# Patient Record
Sex: Female | Born: 1935 | Race: White | Hispanic: No | Smoking: Former smoker
Health system: Southern US, Community
[De-identification: ages and names within clinical notes are randomized; demographics above are authoritative.]

## PROBLEM LIST (undated history)

## (undated) DIAGNOSIS — N952 Postmenopausal atrophic vaginitis: Secondary | ICD-10-CM

## (undated) DIAGNOSIS — J449 Chronic obstructive pulmonary disease, unspecified: Secondary | ICD-10-CM

## (undated) DIAGNOSIS — M8580 Other specified disorders of bone density and structure, unspecified site: Secondary | ICD-10-CM

## (undated) DIAGNOSIS — R3129 Other microscopic hematuria: Secondary | ICD-10-CM

## (undated) DIAGNOSIS — I10 Essential (primary) hypertension: Secondary | ICD-10-CM

## (undated) HISTORY — PX: OTHER SURGICAL HISTORY: SHX169

## (undated) HISTORY — DX: Other specified disorders of bone density and structure, unspecified site: M85.80

## (undated) HISTORY — PX: TONSILLECTOMY: SUR1361

## (undated) HISTORY — PX: THYROIDECTOMY, PARTIAL: SHX18

## (undated) HISTORY — PX: CYSTOSCOPY: SUR368

## (undated) HISTORY — DX: Other microscopic hematuria: R31.29

## (undated) HISTORY — DX: Postmenopausal atrophic vaginitis: N95.2

## (undated) HISTORY — DX: Chronic obstructive pulmonary disease, unspecified: J44.9

---
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
## 1997-12-19 ENCOUNTER — Other Ambulatory Visit: Admission: RE | Admit: 1997-12-19 | Discharge: 1997-12-19 | Payer: Self-pay | Admitting: Obstetrics and Gynecology

## 1998-01-15 ENCOUNTER — Emergency Department (HOSPITAL_COMMUNITY): Admission: EM | Admit: 1998-01-15 | Discharge: 1998-01-15 | Payer: Self-pay | Admitting: Emergency Medicine

## 1999-02-08 ENCOUNTER — Other Ambulatory Visit: Admission: RE | Admit: 1999-02-08 | Discharge: 1999-02-08 | Payer: Self-pay | Admitting: Obstetrics and Gynecology

## 2000-02-09 ENCOUNTER — Other Ambulatory Visit: Admission: RE | Admit: 2000-02-09 | Discharge: 2000-02-09 | Payer: Self-pay | Admitting: Obstetrics and Gynecology

## 2001-02-12 ENCOUNTER — Other Ambulatory Visit: Admission: RE | Admit: 2001-02-12 | Discharge: 2001-02-12 | Payer: Self-pay | Admitting: Obstetrics and Gynecology

## 2002-02-18 ENCOUNTER — Other Ambulatory Visit: Admission: RE | Admit: 2002-02-18 | Discharge: 2002-02-18 | Payer: Self-pay | Admitting: Obstetrics and Gynecology

## 2004-06-30 ENCOUNTER — Other Ambulatory Visit: Admission: RE | Admit: 2004-06-30 | Discharge: 2004-06-30 | Payer: Self-pay | Admitting: Obstetrics and Gynecology

## 2006-05-15 ENCOUNTER — Encounter: Admission: RE | Admit: 2006-05-15 | Discharge: 2006-05-15 | Payer: Self-pay | Admitting: Family Medicine

## 2006-08-29 ENCOUNTER — Other Ambulatory Visit: Admission: RE | Admit: 2006-08-29 | Discharge: 2006-08-29 | Payer: Self-pay | Admitting: Obstetrics and Gynecology

## 2007-01-18 ENCOUNTER — Ambulatory Visit (HOSPITAL_BASED_OUTPATIENT_CLINIC_OR_DEPARTMENT_OTHER): Admission: RE | Admit: 2007-01-18 | Discharge: 2007-01-18 | Payer: Self-pay | Admitting: Urology

## 2007-11-13 ENCOUNTER — Encounter: Admission: RE | Admit: 2007-11-13 | Discharge: 2007-11-13 | Payer: Self-pay | Admitting: Family Medicine

## 2008-09-15 ENCOUNTER — Ambulatory Visit: Payer: Self-pay | Admitting: Obstetrics and Gynecology

## 2008-09-15 ENCOUNTER — Encounter: Payer: Self-pay | Admitting: Obstetrics and Gynecology

## 2008-09-15 ENCOUNTER — Other Ambulatory Visit: Admission: RE | Admit: 2008-09-15 | Discharge: 2008-09-15 | Payer: Self-pay | Admitting: Obstetrics and Gynecology

## 2008-09-29 ENCOUNTER — Ambulatory Visit: Payer: Self-pay | Admitting: Obstetrics and Gynecology

## 2009-10-12 ENCOUNTER — Ambulatory Visit: Payer: Self-pay | Admitting: Obstetrics and Gynecology

## 2010-08-03 NOTE — Op Note (Signed)
Sierra Warner, Sierra Warner                   ACCOUNT NO.:  [id number]   MEDICAL RECORD NO.:  [id number]          PATIENT TYPE:  AMB   LOCATION:  [facility]                         FACILITY:  [facility]   PHYSICIAN:  Sigmund I. Patsi Sears, M.D.DATE OF BIRTH:  [birth year]   DATE OF PROCEDURE:  01/18/2007  DATE OF DISCHARGE:                               OPERATIVE REPORT   PREOPERATIVE DIAGNOSIS:  Severe atrophic urethritis, urethral stenosis,  microhematuria.   POSTOPERATIVE DIAGNOSIS:  Severe atrophic urethritis, urethral stenosis,  microhematuria.   OPERATION:  Urethral dilation and cystourethroscopy.   SURGEON:  Sigmund I. Patsi Sears, M.D.   ANESTHESIA:  General LMA.   PREPARATION:  After appropriate pre-anesthesia, the patient was brought  to the operating room and placed on the operating table in the dorsal  supine position where general LMA anesthesia was introduced.  She was  then replaced in the dorsal lithotomy position where the pubis was  prepped with Betadine solution and draped in the usual fashion.   REVIEW OF HISTORY:  This 77 year old female has a history of  microhematuria, and on examination in the office, was found to have  severe atrophic urethritis.  No caruncle was identified.  There was no  incontinence with cough or sneeze.  The patient was then returned to the  office for attempted cystoscopy, but because of severe urethral  stenosis, cystoscopy could not be accomplished in the office.  The  patient is now for cystoscopy and dilatation.   PROCEDURE:  Urethral dilation was accomplished to a size 49 [place] with  female sounds.  Xylocaine jelly was placed in the urethra and a  combination of Xylocaine and Marcaine with 1:200,000 epinephrine is  injected into the urethral tissue to afford postoperative urethral  analgesia.  Following urethral dilation, cystoscopy was performed which  shows the bladder with some trabeculation, but no cellules.  There was  no stone, tumor, or  diverticula formation noted.  Clear efflux was seen  from both orifices.  The cystoscope was removed and the remaining  Xylocaine jelly placed in the urethra and the bladder.  The patient was  given IV Toradol, awakened and taken to the recovery room in good  condition.      Sigmund I. Patsi Sears, M.D.  Electronically Signed     SIT/MEDQ  D:  01/18/2007  T:  01/18/2007  Job:  [id number]   cc:   Rande Brunt. Eda Paschal, M.D.  Fax: [phone]

## 2010-09-24 ENCOUNTER — Encounter: Payer: Self-pay | Admitting: *Deleted

## 2010-10-06 ENCOUNTER — Encounter (INDEPENDENT_AMBULATORY_CARE_PROVIDER_SITE_OTHER): Payer: Medicare Other

## 2010-10-06 DIAGNOSIS — M899 Disorder of bone, unspecified: Secondary | ICD-10-CM

## 2010-10-19 ENCOUNTER — Encounter: Payer: Self-pay | Admitting: Obstetrics and Gynecology

## 2010-10-19 ENCOUNTER — Other Ambulatory Visit (HOSPITAL_COMMUNITY)
Admission: RE | Admit: 2010-10-19 | Discharge: 2010-10-19 | Disposition: A | Discharge status: Home / Self Care | Payer: Medicare Other | Source: Ambulatory Visit | Attending: Obstetrics and Gynecology | Admitting: Obstetrics and Gynecology

## 2010-10-19 ENCOUNTER — Ambulatory Visit (INDEPENDENT_AMBULATORY_CARE_PROVIDER_SITE_OTHER): Payer: Medicare Other | Admitting: Obstetrics and Gynecology

## 2010-10-19 VITALS — BP 134/70 | Ht 61.0 in | Wt 124.0 lb

## 2010-10-19 DIAGNOSIS — M810 Age-related osteoporosis without current pathological fracture: Secondary | ICD-10-CM

## 2010-10-19 DIAGNOSIS — Z124 Encounter for screening for malignant neoplasm of cervix: Secondary | ICD-10-CM | POA: Insufficient documentation

## 2010-10-19 DIAGNOSIS — N951 Menopausal and female climacteric states: Secondary | ICD-10-CM

## 2010-10-19 DIAGNOSIS — N952 Postmenopausal atrophic vaginitis: Secondary | ICD-10-CM

## 2010-10-19 DIAGNOSIS — Z01419 Encounter for gynecological examination (general) (routine) without abnormal findings: Secondary | ICD-10-CM

## 2010-10-19 DIAGNOSIS — R319 Hematuria, unspecified: Secondary | ICD-10-CM

## 2010-10-19 DIAGNOSIS — R351 Nocturia: Secondary | ICD-10-CM

## 2010-10-19 DIAGNOSIS — Z780 Asymptomatic menopausal state: Secondary | ICD-10-CM

## 2010-10-19 NOTE — Progress Notes (Signed)
Addended byCammie Mcgee T on: 10/19/2010 02:58 PM   Modules accepted: Orders

## 2010-10-19 NOTE — Progress Notes (Signed)
The patient came to see me a day for continued gynecological care.Patient has been on Fosamax for osteoporosis.Her bone density now shows significant improvement. She only has osteopenia. We have given her a drug holiday.she continues to take calcium and Vitamin D. She is having no pelvic pain or vaginal bleeding. She continues to have microscopic hematuria and is seeing Dr. Gayla Medicus. She is up-to-date on mammograms. She has significant vaginal dryness but is not sexually active and does not need treatment for this. She is nocturia x2 without urgency. She has no dysuria or urinary tract infections. She has minimal menopausal symptoms.  Past medical history unchanged. Please see above notes. Family history unchanged. Please see above notes. Review of systems completely reviewed and is negative except for increasing aches and pains in her neck and right shoulder.  Physical examination: HEENT within normal limits. Neck: Thyroid not large. No masses. Supraclavicular nodes: not enlarged. Breasts: Examined in both sitting midline position. No skin changes and no masses. Abdomen: Soft no guarding rebound or masses or hernia. Pelvic: External: Within normal limits. BUS: Within normal limits. Vaginal:within normal limits. Poor estrogen effect. No evidence of cystocele rectocele or enterocele. Cervix: clean. Uterus: Normal size and shape. Adnexa: No masses. Rectovaginal exam: Confirmatory and negative. Extremities: Within normal limits.  Impression: 1. Low bone mass with improvement. 2. Atrophic vaginitis. 3. Nocturia. 4. Menopausal symptoms. 5. Microscopic hematuria.  Plan: Continue drug holiday, continue yearly mammograms, recheck her urine today, no treatment for nocturia.

## 2010-11-04 ENCOUNTER — Encounter: Payer: Self-pay | Admitting: Obstetrics and Gynecology

## 2010-12-28 ENCOUNTER — Other Ambulatory Visit: Payer: Self-pay | Admitting: Dermatology

## 2010-12-29 LAB — I-STAT 8, (EC8 V) (CONVERTED LAB)
BUN: 12
Bicarbonate: 26 — ABNORMAL HIGH
Chloride: 104
HCT: 49 — ABNORMAL HIGH
Operator id: 268271
Sodium: 136
pH, Ven: 7.39 — ABNORMAL HIGH

## 2011-01-17 ENCOUNTER — Other Ambulatory Visit: Payer: Self-pay | Admitting: Dermatology

## 2011-03-31 ENCOUNTER — Other Ambulatory Visit: Payer: Self-pay | Admitting: Family Medicine

## 2011-03-31 ENCOUNTER — Ambulatory Visit
Admission: RE | Admit: 2011-03-31 | Discharge: 2011-03-31 | Disposition: A | Discharge status: Home / Self Care | Payer: Medicare Other | Source: Ambulatory Visit | Attending: Family Medicine | Admitting: Family Medicine

## 2011-03-31 DIAGNOSIS — R05 Cough: Secondary | ICD-10-CM

## 2011-03-31 DIAGNOSIS — R0602 Shortness of breath: Secondary | ICD-10-CM

## 2011-03-31 DIAGNOSIS — J441 Chronic obstructive pulmonary disease with (acute) exacerbation: Secondary | ICD-10-CM | POA: Diagnosis not present

## 2011-03-31 DIAGNOSIS — J069 Acute upper respiratory infection, unspecified: Secondary | ICD-10-CM | POA: Diagnosis not present

## 2011-03-31 DIAGNOSIS — R0989 Other specified symptoms and signs involving the circulatory and respiratory systems: Secondary | ICD-10-CM | POA: Diagnosis not present

## 2011-04-04 ENCOUNTER — Ambulatory Visit (INDEPENDENT_AMBULATORY_CARE_PROVIDER_SITE_OTHER): Payer: Medicare Other | Admitting: Internal Medicine

## 2011-04-04 ENCOUNTER — Encounter: Payer: Self-pay | Admitting: Internal Medicine

## 2011-04-04 VITALS — BP 142/78 | HR 77 | Temp 97.9°F | Ht 61.0 in | Wt 122.4 lb

## 2011-04-04 DIAGNOSIS — I10 Essential (primary) hypertension: Secondary | ICD-10-CM

## 2011-04-04 DIAGNOSIS — R0609 Other forms of dyspnea: Secondary | ICD-10-CM | POA: Diagnosis not present

## 2011-04-04 DIAGNOSIS — R0989 Other specified symptoms and signs involving the circulatory and respiratory systems: Secondary | ICD-10-CM

## 2011-04-04 DIAGNOSIS — R0600 Dyspnea, unspecified: Secondary | ICD-10-CM

## 2011-04-04 DIAGNOSIS — J449 Chronic obstructive pulmonary disease, unspecified: Secondary | ICD-10-CM | POA: Diagnosis not present

## 2011-04-04 MED ORDER — OLMESARTAN MEDOXOMIL-HCTZ 20-12.5 MG PO TABS: 1.0000 | ORAL_TABLET | Freq: Every day | ORAL | Status: DC

## 2011-04-04 NOTE — Patient Instructions (Signed)
Stop lisinopril  Start Benicar 20/12.5  One daily in place of lisinopril - it's the only way we can tell to what extent your present problems are being caused by your medication  Please schedule a follow up office visit in 4 weeks, sooner if needed with pft's on return

## 2011-04-04 NOTE — Assessment & Plan Note (Signed)
ACE inhibitors are problematic in  pts with airway complaints because  even experienced pulmonologists can't always distinguish ace effects from copd/asthma.  By themselves they don't actually cause a problem, much like oxygen can't by itself start a fire, but they certainly serve as a powerful catalyst or enhancer for any "fire"  or inflammatory process in the upper airway, be it caused by an ET  tube or more commonly reflux (especially in the obese or pts with known GERD or who are on biphoshonates).    In the era of ARB near equivalency until we have a better handle on the reversibility of the airway problem, it just makes sense to avoid ACEI  entirely in the short run and then decide later, having established a level of airway control using a reasonable limited regimen, whether to add back ace but even then being very careful to observe the pt for worsening airway control and number of meds used/ needed to control symptoms.    Will rec she change lisinopril to benicar 20/12.5 one daily x one month trial

## 2011-04-04 NOTE — Assessment & Plan Note (Signed)
  When respiratory symptoms begin well after a patient reports complete smoking cessation,  it is very hard to "blame" COPD specifically or airways disorders in general  ie it doesn't make any more sense than hearing a  [employer] driver wrecked his car while driving his kids to school or a surgeon sliced his hand off carving roast beef (it must be rare indeed!)     That is to say, once the high risk activity stops,  the symptoms should not suddenly erupt or markedly worsen.  If so, the differential diagnosis should include  obesity/deconditioning,  LPR/Reflux/Aspiration syndromes,  occult CHF, or  especially side effect of medications commonly used in this population, especially ace inhibitors   Needs fair trial off acei and then return to regroup with pfts

## 2011-04-04 NOTE — Progress Notes (Signed)
  Subjective:    Patient ID: Sierra Warner, female    DOB: 1928-12-01    MRN: [id number]  HPI  49 yowf quit smoking around 2007 due to concerns re risk of lung dz but actually no active symptoms or need for any inhalers at all  until 2010 when sob and cough onset so  Started symbicort and combivent and referred by Dr Jillyn Hidden 04/04/2011 to pulmonary clinic for refractory cough and sob.   04/04/2011  1 st pulmonary eval cc before Christmas  2012 breathing ok on symbicort / combivent happy x cost and "mouth and throat irriation"  then much worse abruptly since Christmas 2012 main complaint is throat and chest congestion but not excess mucus, no better with combivent, no purulent sputum assoc with nasal congestion.  Sleeping ok without nocturnal  or early am exacerbation  of respiratory  c/o's or need for noct saba. Also denies any obvious fluctuation of symptoms with weather or environmental changes or other aggravating or alleviating factors except as outlined above      Review of Systems  Constitutional: Positive for unexpected weight change. Negative for fever and chills.  HENT: Positive for congestion and sneezing. Negative for ear pain, nosebleeds, sore throat, rhinorrhea, trouble swallowing, dental problem, voice change, postnasal drip and sinus pressure.   Eyes: Negative for visual disturbance.  Respiratory: Positive for cough and shortness of breath. Negative for choking.   Cardiovascular: Negative for chest pain and leg swelling.  Gastrointestinal: Positive for abdominal pain. Negative for vomiting and diarrhea.  Genitourinary: Negative for difficulty urinating.  Musculoskeletal: Negative for arthralgias.  Skin: Negative for rash.  Neurological: Negative for tremors, syncope and headaches.  Hematological: Does not bruise/bleed easily.       Objective:   Physical Exam  amb hoarse wf with hopeless helpless affect nad with classic voice fatigue, throat clearing and pseudowheze Wt 122  04/04/2011  HEENT mild turbinate edema.  Oropharynx no thrush or excess pnd or cobblestoning.  No JVD or cervical adenopathy. Mild accessory muscle hypertrophy. Trachea midline, nl thryroid. Chest was hyperinflated by percussion with diminished breath sounds and mild increased exp time without wheeze. Hoover sign positive at  End inspiration. Regular rate and rhythm without murmur gallop or rub or increase P2 or edema.  Abd: no hsm, nl excursion. Ext warm without cyanosis or clubbing.    03/31/11 cxr No change in hyperaeration. No active lung disease.     Assessment & Plan:

## 2011-04-18 DIAGNOSIS — L723 Sebaceous cyst: Secondary | ICD-10-CM | POA: Diagnosis not present

## 2011-05-02 ENCOUNTER — Ambulatory Visit (INDEPENDENT_AMBULATORY_CARE_PROVIDER_SITE_OTHER): Payer: Medicare Other | Admitting: Internal Medicine

## 2011-05-02 ENCOUNTER — Encounter: Payer: Self-pay | Admitting: Internal Medicine

## 2011-05-02 VITALS — BP 144/70 | HR 113 | Temp 98.1°F | Ht 61.0 in | Wt 122.0 lb

## 2011-05-02 DIAGNOSIS — R0600 Dyspnea, unspecified: Secondary | ICD-10-CM

## 2011-05-02 DIAGNOSIS — I10 Essential (primary) hypertension: Secondary | ICD-10-CM | POA: Diagnosis not present

## 2011-05-02 DIAGNOSIS — R0609 Other forms of dyspnea: Secondary | ICD-10-CM | POA: Diagnosis not present

## 2011-05-02 DIAGNOSIS — J449 Chronic obstructive pulmonary disease, unspecified: Secondary | ICD-10-CM | POA: Diagnosis not present

## 2011-05-02 DIAGNOSIS — R0989 Other specified symptoms and signs involving the circulatory and respiratory systems: Secondary | ICD-10-CM | POA: Diagnosis not present

## 2011-05-02 LAB — PULMONARY FUNCTION TEST

## 2011-05-02 MED ORDER — BUDESONIDE 0.25 MG/2ML IN SUSP: 0.2500 mg | Freq: Two times a day (BID) | RESPIRATORY_TRACT | Status: DC

## 2011-05-02 MED ORDER — FORMOTEROL FUMARATE 20 MCG/2ML IN NEBU: 20.0000 ug | INHALATION_SOLUTION | Freq: Two times a day (BID) | RESPIRATORY_TRACT | Status: DC

## 2011-05-02 NOTE — Patient Instructions (Signed)
Performist 20 mcg and Budesonide 0.25 mg  twice daily with nebulizer  Stay on Benicar 20 mg/ 12.5 mg daily until seen by Fulp  Please see patient coordinator before you leave today  to schedule a home delivery nebulizer   Only use your combient  as a rescue medication to be used if you can't catch your breath by resting or doing a relaxed purse lip breathing pattern. The less you use it, the better it will work when you need it.    If you are satisfied with your treatment plan let your doctor know and he/she can either refill your medications or you can return here when your prescription runs out.     If in any way you are not 100% satisfied,  please tell us.  If 100% better, tell your friends!

## 2011-05-02 NOTE — Progress Notes (Signed)
PFT done today. 

## 2011-05-02 NOTE — Assessment & Plan Note (Signed)
-   Trial off ACEI rec 04/04/2011  > marked symptomatic improvement    - PFT's 05/02/2011 FEV1  0.69 (43%) ratio 43 and 18% better p B2, DLCO 57% corrects to 103  GOLD II/III copd but significant reversibility suggesting an ab component so best choice here is combination rx with LABA and ICS  I had an extended discussion with the patient today lasting 15 to 20 minutes of a 25 minute visit on the following issues:   Multiple options reviewed, thinks neb will work the best    Each maintenance medication was reviewed in detail including most importantly the difference between maintenance and as needed and under what circumstances the prns are to be used.  Please see instructions for details which were reviewed in writing and the patient given a copy.

## 2011-05-02 NOTE — Progress Notes (Signed)
  Subjective:    Patient ID: Sierra Warner, female    DOB: [birth year]    MRN: [id number]  Brief patient profile:  82 yowf quit smoking around 2007 due to concerns re risk of lung dz but actually no active symptoms or need for any inhalers at all  until 2010 when sob and cough onset so  Started symbicort and combivent and referred by Dr Sierra Warner 04/04/2011 to pulmonary clinic for refractory cough and sob.   04/04/2011  1 st pulmonary eval cc before Christmas  2012 breathing ok on symbicort / combivent happy x cost and "mouth and throat irriation"  then much worse abruptly since Christmas 2012 main complaint is throat and chest congestion but not excess mucus, no better with combivent, no purulent sputum assoc with nasal congestion. rec Stop lisinopril Start Benicar 20/12.5  One daily in place of lisinopril - it's the only way we can tell to what extent your present problems are being caused by your medication    05/02/2011 f/u ov/Sierra Warner cc cough and throat irritation better, on symbicort but doesn't think she can afford it and off acei since 04/04/11,  No excess sputum production, confused with maint symbicort vs prn symbicort  Sleeping ok without nocturnal  or early am exacerbation  of respiratory  c/o's or need for noct saba. Also denies any obvious fluctuation of symptoms with weather or environmental changes or other aggravating or alleviating factors except as outlined above   ROS  At present neg for  any significant sore throat, dysphagia, itching, sneezing,  nasal congestion or excess/ purulent secretions,  fever, chills, sweats, unintended wt loss, pleuritic or exertional cp, hempoptysis, orthopnea pnd or leg swelling.  Also denies presyncope, palpitations, heartburn, abdominal pain, nausea, vomiting, diarrhea  or change in bowel or urinary habits, dysuria,hematuria,  rash, arthralgias, visual complaints, headache, numbness weakness or ataxia.        Objective:   Physical Exam  amb hoarse wf with  much less, throat clearing and no longer pseudowheze Wt 122 04/04/2011 > 05/02/2011  122 HEENT mild turbinate edema.  Oropharynx no thrush or excess pnd or cobblestoning.  No JVD or cervical adenopathy. Mild accessory muscle hypertrophy. Trachea midline, nl thryroid. Chest was hyperinflated by percussion with diminished breath sounds and mild increased exp time without wheeze. Hoover sign positive at  End inspiration. Regular rate and rhythm without murmur gallop or rub or increase P2 or edema.  Abd: no hsm, nl excursion. Ext warm without cyanosis or clubbing.    03/31/11 cxr No change in hyperaeration. No active lung disease.     Assessment & Plan:

## 2011-05-02 NOTE — Assessment & Plan Note (Signed)
ACE inhibitors are problematic in  pts with airway complaints because  even experienced pulmonologists can't always distinguish ace effects from copd/asthma/pnds/ allergies etc.  By themselves they don't actually cause a problem, much like oxygen can't by itself start a fire, but they certainly serve as a powerful catalyst or enhancer for any "fire"  or inflammatory process in the upper airway, be it caused by an ET  tube or more commonly reflux (especially in the obese or pts with known GERD or who are on biphoshonates) or URI's, due to interference with bradykinin clearance.  The effects of acei on bradykinin levels occurs in 100% of pt's on acei (unless they surreptitiously stop the med!) but the classic cough is only reported in 5%.  This leaves 95% of pts on acei's  with a variety of syndromes including no identifiable symptom in most  vs non-specific symptoms that wax and wane depending on what other insult is occuring at the level of the upper airway. The only way to be sure about the latter is a trial off acei x min 4 weeks which was done here and she is clearly better so I would avoid ACEI in this pt with tendency to non-specific/ chronic recurrent resp symptoms in favor of arb which she is tolerating well.    Defer final choice of rx to primary svc

## 2011-05-19 ENCOUNTER — Encounter: Payer: Self-pay | Admitting: Internal Medicine

## 2011-05-19 DIAGNOSIS — J449 Chronic obstructive pulmonary disease, unspecified: Secondary | ICD-10-CM | POA: Diagnosis not present

## 2011-05-20 DIAGNOSIS — L02419 Cutaneous abscess of limb, unspecified: Secondary | ICD-10-CM | POA: Diagnosis not present

## 2011-05-20 DIAGNOSIS — J4489 Other specified chronic obstructive pulmonary disease: Secondary | ICD-10-CM | POA: Diagnosis not present

## 2011-05-20 DIAGNOSIS — R238 Other skin changes: Secondary | ICD-10-CM | POA: Diagnosis not present

## 2011-05-20 DIAGNOSIS — I10 Essential (primary) hypertension: Secondary | ICD-10-CM | POA: Diagnosis not present

## 2011-05-20 DIAGNOSIS — J449 Chronic obstructive pulmonary disease, unspecified: Secondary | ICD-10-CM | POA: Diagnosis not present

## 2011-05-20 DIAGNOSIS — L03119 Cellulitis of unspecified part of limb: Secondary | ICD-10-CM | POA: Diagnosis not present

## 2011-05-20 DIAGNOSIS — Z79899 Other long term (current) drug therapy: Secondary | ICD-10-CM | POA: Diagnosis not present

## 2011-05-30 ENCOUNTER — Encounter: Payer: Self-pay | Admitting: Internal Medicine

## 2011-06-01 ENCOUNTER — Other Ambulatory Visit: Payer: Self-pay | Admitting: Internal Medicine

## 2011-06-01 ENCOUNTER — Telehealth: Payer: Self-pay | Admitting: Internal Medicine

## 2011-06-01 DIAGNOSIS — R0902 Hypoxemia: Secondary | ICD-10-CM

## 2011-06-01 DIAGNOSIS — G4734 Idiopathic sleep related nonobstructive alveolar hypoventilation: Secondary | ICD-10-CM

## 2011-06-01 NOTE — Telephone Encounter (Signed)
Per MW- ONO on RA does qualify pt for o2 at 2 lpm with sleep Needs to start on o2 and then have ONO repeated on 2 lpm with an ov to follow  Spoke with pt and notified of this and she verbalized understanding. Order sent to [facility] for ONO on 2lpm and we will call the pt with results and then set up ov at that time.

## 2011-06-06 ENCOUNTER — Encounter: Payer: Self-pay | Admitting: Internal Medicine

## 2011-06-06 DIAGNOSIS — J9620 Acute and chronic respiratory failure, unspecified whether with hypoxia or hypercapnia: Secondary | ICD-10-CM | POA: Insufficient documentation

## 2011-06-13 ENCOUNTER — Ambulatory Visit (INDEPENDENT_AMBULATORY_CARE_PROVIDER_SITE_OTHER): Payer: Medicare Other | Admitting: Internal Medicine

## 2011-06-13 ENCOUNTER — Encounter: Payer: Self-pay | Admitting: Internal Medicine

## 2011-06-13 VITALS — BP 138/78 | HR 105 | Temp 97.8°F | Ht 60.25 in | Wt 124.2 lb

## 2011-06-13 DIAGNOSIS — J449 Chronic obstructive pulmonary disease, unspecified: Secondary | ICD-10-CM

## 2011-06-13 DIAGNOSIS — J9610 Chronic respiratory failure, unspecified whether with hypoxia or hypercapnia: Secondary | ICD-10-CM | POA: Diagnosis not present

## 2011-06-13 NOTE — Progress Notes (Signed)
  Subjective:    Patient ID: Sierra Warner, female    DOB: [birth year]    MRN: [id number]  Brief patient profile:  82 yowf quit smoking around 2007 due to concerns re risk of lung dz but actually no active symptoms or need for any inhalers at all  until 2010 when sob and cough onset so  Started symbicort and combivent and referred by Dr Jillyn Hidden 04/04/2011 to pulmonary clinic for refractory cough and sob on acei with GOLD III copd by pfts 04/2011.   04/04/2011  1 st pulmonary eval cc before Christmas  2012 breathing ok on symbicort / combivent happy x cost and "mouth and throat irriation"  then much worse abruptly since Christmas 2012 main complaint is throat and chest congestion but not excess mucus, no better with combivent, no purulent sputum assoc with nasal congestion. rec Stop lisinopril Start Benicar 20/12.5  One daily in place of lisinopril - it's the only way we can tell to what extent your present problems are being caused by your medication.    05/02/2011 f/u ov/Elianah Karis cc cough and throat irritation better, on symbicort but doesn't think she can afford it and off acei since 04/04/11,  No excess sputum production, confused with maint symbicort vs prn symbicort rec Performist 20 mcg and Budesonide 0.25 mg  twice daily with nebulizer Stay on Benicar 20 mg/ 12.5 mg daily until seen by Fulp Please see patient coordinator before you leave today  to schedule a home delivery nebulizer  Only use your combient  as a rescue medication  06/13/2011 f/u ov/Luc Shammas cc doe but not able to name one daily desired activity where breathing limits her, no sign cough or extra doses of combivent as rescue daytime - concerned mostly about how long it takes to use the nebulizer vs HFA which she is actually quite good at but cannot afford under medicare part D.  Sleeping ok without nocturnal  or early am exacerbation  of respiratory  c/o's or need for noct saba. Also denies any obvious fluctuation of symptoms with weather or  environmental changes or other aggravating or alleviating factors except as outlined above.  ROS  At present neg for  any significant sore throat, dysphagia, itching, sneezing,  nasal congestion or excess/ purulent secretions,  fever, chills, sweats, unintended wt loss, pleuritic or exertional cp, hempoptysis, orthopnea pnd or leg swelling.  Also denies presyncope, palpitations, heartburn, abdominal pain, nausea, vomiting, diarrhea  or change in bowel or urinary habits, dysuria,hematuria,  rash, arthralgias, visual complaints, headache, numbness weakness or ataxia.        Objective:   Physical Exam  amb hoarse wf with much less, throat clearing and no longer pseudowheze Wt 122 04/04/2011 > 05/02/2011  122 > 06/13/2011 124  HEENT mild turbinate edema.  Oropharynx no thrush or excess pnd or cobblestoning.  No JVD or cervical adenopathy. Mild accessory muscle hypertrophy. Trachea midline, nl thryroid. Chest was hyperinflated by percussion with diminished breath sounds and mild increased exp time without wheeze. Hoover sign positive at  End inspiration. Regular rate and rhythm without murmur gallop or rub or increase P2 or edema.  Abd: no hsm, nl excursion. Ext warm without cyanosis or clubbing.    03/31/11 cxr No change in hyperaeration. No active lung disease.     Assessment & Plan:

## 2011-06-13 NOTE — Patient Instructions (Signed)
Continue the nebulizer first thing each am but the second dose is optional  Please see patient coordinator before you leave today  to schedule 02 at 2lpm then recheck your saturations on 2lpms  Zyrtec is the best allergy pill on the marked   Please schedule a follow up visit in 3 months but call sooner if needed

## 2011-06-14 NOTE — Assessment & Plan Note (Signed)
-   Trial off ACEI rec 04/04/2011  > marked symptomatic improvement    - PFT's 05/02/2011 FEV1  0.69 (43%) ratio 43 and 18% better p B2, DLCO 57% corrects to 103  GOLD III with significant reversible component and no desired activities she's limited from doing p daily bud/formoterol nor any tendency to aecopd at this point off acei, so for now keep it simple with either symbicort 160 or it's equivalient per neb.    Each maintenance medication was reviewed in detail including most importantly the difference between maintenance and as needed and under what circumstances the prns are to be used.  Please see instructions for details which were reviewed in writing and the patient given a copy.

## 2011-06-14 NOTE — Assessment & Plan Note (Signed)
-   ONO RA 05/18/11 desat 18 min < 88%  rx 02 2lpm 06/13/2011 and then recheck ono on 2lpm

## 2011-06-15 ENCOUNTER — Telehealth: Payer: Self-pay | Admitting: Internal Medicine

## 2011-06-15 NOTE — Telephone Encounter (Signed)
Noted.  Will forward to MW as an Financial planner.

## 2011-06-20 ENCOUNTER — Encounter: Payer: Self-pay | Admitting: Internal Medicine

## 2011-06-27 ENCOUNTER — Telehealth: Payer: Self-pay | Admitting: Internal Medicine

## 2011-06-27 NOTE — Telephone Encounter (Signed)
Per MW- ONO on 2 lpm is okay for now, should cont. o2 at 2 lpm with sleep and will discuss further at next ov.  Spoke with pt and notified of results and she verbalized understanding.

## 2011-07-05 ENCOUNTER — Encounter: Payer: Self-pay | Admitting: Internal Medicine

## 2011-08-23 DIAGNOSIS — E785 Hyperlipidemia, unspecified: Secondary | ICD-10-CM | POA: Diagnosis not present

## 2011-08-23 DIAGNOSIS — Z79899 Other long term (current) drug therapy: Secondary | ICD-10-CM | POA: Diagnosis not present

## 2011-08-25 DIAGNOSIS — F411 Generalized anxiety disorder: Secondary | ICD-10-CM | POA: Diagnosis not present

## 2011-08-25 DIAGNOSIS — I10 Essential (primary) hypertension: Secondary | ICD-10-CM | POA: Diagnosis not present

## 2011-08-25 DIAGNOSIS — J4489 Other specified chronic obstructive pulmonary disease: Secondary | ICD-10-CM | POA: Diagnosis not present

## 2011-08-25 DIAGNOSIS — J449 Chronic obstructive pulmonary disease, unspecified: Secondary | ICD-10-CM | POA: Diagnosis not present

## 2011-08-25 DIAGNOSIS — E785 Hyperlipidemia, unspecified: Secondary | ICD-10-CM | POA: Diagnosis not present

## 2011-09-12 ENCOUNTER — Encounter: Payer: Self-pay | Admitting: Internal Medicine

## 2011-09-12 ENCOUNTER — Ambulatory Visit (INDEPENDENT_AMBULATORY_CARE_PROVIDER_SITE_OTHER): Payer: Medicare Other | Admitting: Internal Medicine

## 2011-09-12 VITALS — BP 158/88 | HR 111 | Temp 98.1°F | Ht 62.0 in | Wt 125.6 lb

## 2011-09-12 DIAGNOSIS — J449 Chronic obstructive pulmonary disease, unspecified: Secondary | ICD-10-CM | POA: Diagnosis not present

## 2011-09-12 DIAGNOSIS — J9610 Chronic respiratory failure, unspecified whether with hypoxia or hypercapnia: Secondary | ICD-10-CM | POA: Diagnosis not present

## 2011-09-12 NOTE — Assessment & Plan Note (Signed)
-   Trial off ACEI rec 04/04/2011  > marked symptomatic improvement    - PFT's 05/02/2011 FEV1  0.69 (43%) ratio 43 and 18% better p B2, DLCO 57% corrects to 103  GOLD II/III with no tendency to aecopd but noct hypoxemia > refuses to wear 02 qhs but wants to keep it in case  No change in meds needed -each maintenance medication was reviewed in detail including most importantly the difference between maintenance and as needed and under what circumstances the prns are to be used.  Please see instructions for details which were reviewed in writing and the patient given a copy.

## 2011-09-12 NOTE — Assessment & Plan Note (Signed)
-   ONO RA 05/18/11 desat 18 min < 88%  rx 02 2lpm 06/13/2011 and then rechecked ono on 2lpm 06/14/11  > 17 min desat all above 80    - Declined to wear 02 qhs 09/12/2011   Discussed in detail all the  indications, usual  risks and alternatives  relative to the benefits with patient who declined qhs 02 but wants to keep the concentrator for prn use nonetheless.

## 2011-09-12 NOTE — Progress Notes (Signed)
Subjective:    Patient ID: Sierra Warner, female    DOB: March 21, 1929    MRN: [id number]  Brief patient profile:  83 yowf quit smoking around 2007 due to concerns re risk of lung dz but actually no active symptoms or need for any inhalers at all  until 2010 when sob and cough onset so  Started symbicort and combivent and referred by Dr Jillyn Hidden 04/04/2011 to pulmonary clinic for refractory cough and sob on acei with GOLD III copd by pfts 04/2011.   04/04/2011  1 st pulmonary eval cc before Christmas  2012 breathing ok on symbicort / combivent happy x cost and "mouth and throat irriation"  then much worse abruptly since Christmas 2012 main complaint is throat and chest congestion but not excess mucus, no better with combivent, no purulent sputum assoc with nasal congestion. rec Stop lisinopril Start Benicar 20/12.5  One daily in place of lisinopril - it's the only way we can tell to what extent your present problems are being caused by your medication.    05/02/2011 f/u ov/Sierra Warner cc cough and throat irritation better, on symbicort but doesn't think she can afford it and off acei since 04/04/11,  No excess sputum production, confused with maint symbicort vs prn symbicort rec Performist 20 mcg and Budesonide 0.25 mg  twice daily with nebulizer Stay on Benicar 20 mg/ 12.5 mg daily until seen by [facility] Please see patient coordinator before you leave today  to schedule a home delivery nebulizer  Only use your combient  as a rescue medication  06/13/2011 f/u ov/Sierra Warner cc doe but not able to name one daily desired activity where breathing limits her, no sign cough or extra doses of combivent as rescue daytime - concerned mostly about how long it takes to use the nebulizer vs HFA which she is actually quite good at but cannot afford under medicare part D. rec Continue the nebulizer first thing each am but the second dose is optional Please see patient coordinator before you leave today  to schedule 02 at 2lpm then  recheck your saturations on 2lpm Zyrtec is the best allergy pill on the marked    09/12/2011 f/u ov/Sierra Warner no longer wearing 02 at all and really can't tell any difference in terms of sleep quality.  No am ha and ams. Using combivent avg of 3 x daytime only with ok activity tol, no unsual cough or excess mucus production. No overt hb or sinus complaints or variability in terms of doe or need for combivent   Sleeping ok without nocturnal  or early am exacerbation  of respiratory  c/o's or need for noct saba. Also denies any obvious fluctuation of symptoms with weather or environmental changes or other aggravating or alleviating factors except as outlined above.  ROS  No c/o any significant sore throat, dysphagia, itching, sneezing,  nasal congestion or excess/ purulent secretions,  fever, chills, sweats, unintended wt loss, pleuritic or exertional cp, hempoptysis, orthopnea pnd or leg swelling.  Also denies presyncope, palpitations, heartburn, abdominal pain, nausea, vomiting, diarrhea  or change in bowel or urinary habits, dysuria,hematuria,  rash, arthralgias, visual complaints, headache, numbness weakness or ataxia.        Objective:   Physical Exam  amb hoarse wf with much less, throat clearing and no longer pseudowheze Wt 122 04/04/2011 > 05/02/2011  122 > 06/13/2011 124 > 09/12/2011  125 HEENT mild turbinate edema.  Oropharynx no thrush or excess pnd or cobblestoning.  No JVD or cervical adenopathy. Mild  accessory muscle hypertrophy. Trachea midline, nl thryroid. Chest was hyperinflated by percussion with diminished breath sounds and mild increased exp time without wheeze. Hoover sign positive at  End inspiration. Regular rate and rhythm without murmur gallop or rub or increase P2 or edema.  Abd: no hsm, nl excursion. Ext warm without cyanosis or clubbing.    03/31/11 cxr No change in hyperaeration. No active lung disease.     Assessment & Plan:

## 2011-09-12 NOTE — Patient Instructions (Addendum)
No change in maintenance medications  Please schedule a follow up visit in 3 months but call sooner if needed

## 2011-10-13 DIAGNOSIS — H52209 Unspecified astigmatism, unspecified eye: Secondary | ICD-10-CM | POA: Diagnosis not present

## 2011-10-13 DIAGNOSIS — H5210 Myopia, unspecified eye: Secondary | ICD-10-CM | POA: Diagnosis not present

## 2011-10-13 DIAGNOSIS — Z961 Presence of intraocular lens: Secondary | ICD-10-CM | POA: Diagnosis not present

## 2011-10-13 DIAGNOSIS — H43819 Vitreous degeneration, unspecified eye: Secondary | ICD-10-CM | POA: Diagnosis not present

## 2011-10-21 ENCOUNTER — Other Ambulatory Visit: Payer: Self-pay | Admitting: Obstetrics and Gynecology

## 2011-10-21 ENCOUNTER — Ambulatory Visit (INDEPENDENT_AMBULATORY_CARE_PROVIDER_SITE_OTHER): Payer: Medicare Other | Admitting: Obstetrics and Gynecology

## 2011-10-21 ENCOUNTER — Encounter: Payer: Self-pay | Admitting: Obstetrics and Gynecology

## 2011-10-21 VITALS — BP 120/74 | Ht 60.5 in | Wt 124.0 lb

## 2011-10-21 DIAGNOSIS — R1032 Left lower quadrant pain: Secondary | ICD-10-CM | POA: Diagnosis not present

## 2011-10-21 DIAGNOSIS — N952 Postmenopausal atrophic vaginitis: Secondary | ICD-10-CM

## 2011-10-21 DIAGNOSIS — M899 Disorder of bone, unspecified: Secondary | ICD-10-CM

## 2011-10-21 DIAGNOSIS — M949 Disorder of cartilage, unspecified: Secondary | ICD-10-CM | POA: Diagnosis not present

## 2011-10-21 DIAGNOSIS — R351 Nocturia: Secondary | ICD-10-CM

## 2011-10-21 DIAGNOSIS — M8580 Other specified disorders of bone density and structure, unspecified site: Secondary | ICD-10-CM

## 2011-10-21 NOTE — Progress Notes (Signed)
Patient came back to see me today for further followup. Her biggest issue is respiratory failure causing shortness of breath with exertion. She sees Dr. Sharee Pimple. She did however mentioned to me some left lower quadrant pain. Although she thinks it's been going on for over a year it has been much worse lately. She notices it more when she is on her feet and especially in the grocery store when she pushes a cart. It is not associated with nausea, vomiting or change in bowel habits. She is having no vaginal bleeding. She is not sexually active and is unaware of dryness. She has nocturia x3 without urgency or incontinence but does not want to take medication. She has no dysuria. She is due for yearly mammogram. She has had 2 normal colonoscopies with the last one last year. She has never had an abnormal Pap smear. Her last Pap smear was July, 2012. It was normal. She has osteopenia and has been on biphosphonate's but is been on drug holiday for one year. Her last bone density was 2012. She has had no fractures. She takes calcium and extra vitamin D.  ROS: 12 system review done. Pertinent positives above. Other positives are hypertension and COPD.  Physical examination:Sierra Warner. HEENT within normal limits. Neck: Thyroid not large. No masses. Supraclavicular nodes: not enlarged. Breasts: Examined in both sitting and lying  position. No skin changes and no masses. Abdomen: Soft no guarding rebound or masses or hernia. Pelvic: External: Within normal limits. BUS: Within normal limits. Vaginal:within normal limits. poor estrogen effect. No evidence of cystocele rectocele or enterocele. Cervix: clean. Uterus: Normal size and shape. Adnexa: No masses. Rectovaginal exam: Confirmatory and negative. Extremities: Within normal limits.  Assessment: #1. Left lower quadrant pain #2. Atrophic vaginitis #3. Nocturia #4. Osteopenia on drug holiday  Plan: Pelvic ultrasound scheduled. Mammogram. Call when necessary  need for medication for nocturia. Bone density in 2014.The new Pap smear guidelines were discussed with the patient. No pap done.

## 2011-10-23 LAB — URINALYSIS W MICROSCOPIC + REFLEX CULTURE
Crystals: NONE SEEN
Hgb urine dipstick: NEGATIVE
Ketones, ur: NEGATIVE mg/dL
Nitrite: POSITIVE — AB
Protein, ur: NEGATIVE mg/dL
Specific Gravity, Urine: 1.015 (ref 1.005–1.030)
pH: 6.5 (ref 5.0–8.0)

## 2011-10-26 ENCOUNTER — Other Ambulatory Visit: Payer: Self-pay | Admitting: Obstetrics and Gynecology

## 2011-10-26 ENCOUNTER — Encounter: Payer: Self-pay | Admitting: Obstetrics and Gynecology

## 2011-10-26 DIAGNOSIS — N390 Urinary tract infection, site not specified: Secondary | ICD-10-CM

## 2011-10-26 LAB — URINE CULTURE

## 2011-10-26 MED ORDER — CIPROFLOXACIN HCL 500 MG PO TABS: 500.0000 mg | ORAL_TABLET | Freq: Two times a day (BID) | ORAL | Status: AC

## 2011-10-31 ENCOUNTER — Ambulatory Visit (INDEPENDENT_AMBULATORY_CARE_PROVIDER_SITE_OTHER): Payer: Medicare Other | Admitting: Obstetrics and Gynecology

## 2011-10-31 ENCOUNTER — Ambulatory Visit (INDEPENDENT_AMBULATORY_CARE_PROVIDER_SITE_OTHER): Payer: Medicare Other

## 2011-10-31 DIAGNOSIS — N83339 Acquired atrophy of ovary and fallopian tube, unspecified side: Secondary | ICD-10-CM

## 2011-10-31 DIAGNOSIS — R1032 Left lower quadrant pain: Secondary | ICD-10-CM

## 2011-10-31 DIAGNOSIS — D259 Leiomyoma of uterus, unspecified: Secondary | ICD-10-CM

## 2011-10-31 NOTE — Progress Notes (Signed)
Patient came back today for an ultrasound because of left lower quadrant pain. On ultrasound her uterus shows a heterogeneous echo pattern. She has a small myoma of less than 1 cm near the wall of the endometrial cavity. Her endometrial echo is 1.7 mm. Her right ovary is normal. We cannot image her left ovary. She did not have adnexal masses on either side. The cul-de-sac was free of fluid. She is taken her Cipro for her UTI.  Assessment: #1. Left lower quadrant pain of unknown etiology. #2. UTI  Plan: Patient reassured about ultrasound. She will finish her Cipro and return for followup urinalysis.

## 2011-10-31 NOTE — Patient Instructions (Signed)
Return after antibiotics for followup urinalysis.

## 2011-11-04 DIAGNOSIS — Z1231 Encounter for screening mammogram for malignant neoplasm of breast: Secondary | ICD-10-CM | POA: Diagnosis not present

## 2011-11-07 ENCOUNTER — Encounter: Payer: Self-pay | Admitting: Obstetrics and Gynecology

## 2011-11-08 ENCOUNTER — Other Ambulatory Visit: Payer: Medicare Other

## 2011-11-08 DIAGNOSIS — N390 Urinary tract infection, site not specified: Secondary | ICD-10-CM

## 2011-11-09 LAB — URINALYSIS W MICROSCOPIC + REFLEX CULTURE
Bacteria, UA: NONE SEEN
Bilirubin Urine: NEGATIVE
Casts: NONE SEEN
Crystals: NONE SEEN
Hgb urine dipstick: NEGATIVE
Ketones, ur: NEGATIVE mg/dL
Specific Gravity, Urine: 1.021 (ref 1.005–1.030)
pH: 7.5 (ref 5.0–8.0)

## 2011-12-14 ENCOUNTER — Ambulatory Visit (INDEPENDENT_AMBULATORY_CARE_PROVIDER_SITE_OTHER): Payer: Medicare Other | Admitting: Internal Medicine

## 2011-12-14 ENCOUNTER — Encounter: Payer: Self-pay | Admitting: Internal Medicine

## 2011-12-14 VITALS — BP 112/62 | HR 98 | Temp 97.8°F | Ht 61.75 in | Wt 125.6 lb

## 2011-12-14 DIAGNOSIS — Z23 Encounter for immunization: Secondary | ICD-10-CM

## 2011-12-14 DIAGNOSIS — J449 Chronic obstructive pulmonary disease, unspecified: Secondary | ICD-10-CM

## 2011-12-14 MED ORDER — TIOTROPIUM BROMIDE MONOHYDRATE 18 MCG IN CAPS: 18.0000 ug | ORAL_CAPSULE | Freq: Every day | RESPIRATORY_TRACT | Status: DC

## 2011-12-14 NOTE — Progress Notes (Signed)
Subjective:    Patient ID: Sierra Warner, female    DOB: [birth year]    MRN: [id number]  Brief patient profile:  83 yowf quit smoking around 2007 due to concerns re risk of lung dz but actually no active symptoms or need for any inhalers at all  until 2010 when sob and cough onset so  Started symbicort and combivent and referred by Dr Jillyn Hidden 04/04/2011 to pulmonary clinic for refractory cough and sob on acei with GOLD III copd by pfts 04/2011.   04/04/2011  1 st pulmonary eval cc before Christmas  2012 breathing ok on symbicort / combivent happy x cost and "mouth and throat irriation"  then much worse abruptly since Christmas 2012 main complaint is throat and chest congestion but not excess mucus, no better with combivent, no purulent sputum assoc with nasal congestion. rec Stop lisinopril Start Benicar 20/12.5  One daily in place of lisinopril - it's the only way we can tell to what extent your present problems are being caused by your medication.    05/02/2011 f/u ov/Kasi Lasky cc cough and throat irritation better, on symbicort but doesn't think she can afford it and off acei since 04/04/11,  No excess sputum production, confused with maint symbicort vs prn symbicort rec Performist 20 mcg and Budesonide 0.25 mg  twice daily with nebulizer Stay on Benicar 20 mg/ 12.5 mg daily until seen by [facility] Please see patient coordinator before you leave today  to schedule a home delivery nebulizer  Only use your combient  as a rescue medication  06/13/2011 f/u ov/Jourdan Durbin cc doe but not able to name one daily desired activity where breathing limits her, no sign cough or extra doses of combivent as rescue daytime - concerned mostly about how long it takes to use the nebulizer vs HFA which she is actually quite good at but cannot afford under medicare part D. rec Continue the nebulizer first thing each am but the second dose is optional Please see patient coordinator before you leave today  to schedule 02 at 2lpm then  recheck your saturations on 2lpm Zyrtec is the best allergy pill on the marked    09/12/2011 f/u ov/Amyri Frenz no longer wearing 02 at all and really can't tell any difference in terms of sleep quality.  No am ha and ams. Using combivent avg of 3 x daytime only with ok activity tol, no unsual cough or excess mucus production. No overt hb or sinus complaints or variability in terms of doe or need for combivent rec No change rx  12/14/2011 f/u ov/Vienne Corcoran cc no change doe but other than bending over making a bed could not related one activity she does daily where her breathing slows her down, not using combivent but a few times a day at most and not clear it's helping. No obvious daytime variabilty or assoc chronic cough or cp or chest tightness, subjective wheeze overt sinus or hb symptoms. No unusual exp h Sleeping ok without nocturnal  or early am exacerbation  of respiratory  c/o's or need for noct saba. Also denies any obvious fluctuation of symptoms with weather or environmental changes or other aggravating or alleviating factors except as outlined above.  ROS  No c/o any significant sore throat, dysphagia, itching, sneezing,  nasal congestion or excess/ purulent secretions,  fever, chills, sweats, unintended wt loss, pleuritic or exertional cp, hempoptysis, orthopnea pnd or leg swelling.  Also denies presyncope, palpitations, heartburn, abdominal pain, nausea, vomiting, diarrhea  or change in bowel or  urinary habits, dysuria,hematuria,  rash, arthralgias, visual complaints, headache, numbness weakness or ataxia.        Objective:   Physical Exam  amb hoarse wf nad hopeless and helpless affect  Wt 122 04/04/2011 > 05/02/2011  122 > 06/13/2011 124 > 09/12/2011  125 > 12/14/2011 125  HEENT mild turbinate edema.  Oropharynx no thrush or excess pnd or cobblestoning.  No JVD or cervical adenopathy. Mild accessory muscle hypertrophy. Trachea midline, nl thryroid. Chest was hyperinflated by percussion with  diminished breath sounds and mild increased exp time without wheeze. Hoover sign positive at  End inspiration. Regular rate and rhythm without murmur gallop or rub or increase P2 or edema.  Abd: no hsm, nl excursion. Ext warm without cyanosis or clubbing.    03/31/11 cxr No change in hyperaeration. No active lung disease.     Assessment & Plan:

## 2011-12-14 NOTE — Patient Instructions (Addendum)
Try adding spiriva one capsule each am to if it improves your activity tolerance going from point A to point B  Continue the perforomist and budesonide twice daily (1st thing in am and then again 12 hours later)  Please schedule a follow up office visit in 4 weeks, sooner if needed

## 2011-12-16 ENCOUNTER — Encounter: Payer: Self-pay | Admitting: Internal Medicine

## 2011-12-16 NOTE — Assessment & Plan Note (Signed)
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 Improved effectiveness after extensive coaching during this visit to a level of approximately  90%

## 2012-01-03 DIAGNOSIS — Z79899 Other long term (current) drug therapy: Secondary | ICD-10-CM | POA: Diagnosis not present

## 2012-01-03 DIAGNOSIS — E559 Vitamin D deficiency, unspecified: Secondary | ICD-10-CM | POA: Diagnosis not present

## 2012-01-03 DIAGNOSIS — I10 Essential (primary) hypertension: Secondary | ICD-10-CM | POA: Diagnosis not present

## 2012-01-03 DIAGNOSIS — J449 Chronic obstructive pulmonary disease, unspecified: Secondary | ICD-10-CM | POA: Diagnosis not present

## 2012-01-03 DIAGNOSIS — J4489 Other specified chronic obstructive pulmonary disease: Secondary | ICD-10-CM | POA: Diagnosis not present

## 2012-01-03 DIAGNOSIS — E049 Nontoxic goiter, unspecified: Secondary | ICD-10-CM | POA: Diagnosis not present

## 2012-01-11 ENCOUNTER — Encounter: Payer: Self-pay | Admitting: Internal Medicine

## 2012-01-11 ENCOUNTER — Ambulatory Visit (INDEPENDENT_AMBULATORY_CARE_PROVIDER_SITE_OTHER): Payer: Medicare Other | Admitting: Internal Medicine

## 2012-01-11 VITALS — BP 160/80 | HR 100 | Temp 97.6°F | Ht 60.5 in | Wt 126.0 lb

## 2012-01-11 DIAGNOSIS — J449 Chronic obstructive pulmonary disease, unspecified: Secondary | ICD-10-CM

## 2012-01-11 NOTE — Patient Instructions (Addendum)
If you are satisfied with your treatment plan let your doctor know and he/she can either refill your medications or you can return here when your prescription runs out.     If in any way you are not 100% satisfied,  please tell us.  If 100% better, tell your friends!  

## 2012-01-11 NOTE — Assessment & Plan Note (Signed)
-   Trial off ACEI rec 04/04/2011  > marked symptomatic improvement    - PFT's 05/02/2011 FEV1  0.69 (43%) ratio 43 and 18% better p B2, DLCO 57% corrects to 103    - Spiriva trial 12/14/2011 > improved ex tolerance  GOLD III but with a significant reversible component >  well compensated on present rx  I had an extended summary discussion with the patient today lasting 15 to 20 minutes of a 25 minute visit on the following issues:    Each maintenance medication was reviewed in detail including most importantly the difference between maintenance and as needed and under what circumstances the prns are to be used.  Please see instructions for details which were reviewed in writing and the patient given a copy.    Pulmonary f/u can be prn

## 2012-01-11 NOTE — Progress Notes (Signed)
Subjective:    Patient ID: Sierra Warner, female    DOB: 07-26-1928    MRN: [id number]  Brief patient profile:  83 yowf quit smoking around 2007 due to concerns re risk of lung dz but actually no active symptoms or need for any inhalers at all  until 2010 when sob and cough onset so  Started symbicort and combivent and referred by Dr Jillyn Hidden 04/04/2011 to pulmonary clinic for refractory cough and sob on acei with GOLD III copd by pfts 04/2011.   04/04/2011  1 st pulmonary eval cc before Christmas  2012 breathing ok on symbicort / combivent happy x cost and "mouth and throat irriation"  then much worse abruptly since Christmas 2012 main complaint is throat and chest congestion but not excess mucus, no better with combivent, no purulent sputum assoc with nasal congestion. rec Stop lisinopril Start Benicar 20/12.5  One daily in place of lisinopril - it's the only way we can tell to what extent your present problems are being caused by your medication.    05/02/2011 f/u ov/Tersa Fotopoulos cc cough and throat irritation better, on symbicort but doesn't think she can afford it and off acei since 04/04/11,  No excess sputum production, confused with maint symbicort vs prn symbicort rec Performist 20 mcg and Budesonide 0.25 mg  twice daily with nebulizer Stay on Benicar 20 mg/ 12.5 mg daily until seen by [facility] Please see patient coordinator before you leave today  to schedule a home delivery nebulizer  Only use your combient  as a rescue medication  06/13/2011 f/u ov/Hellon Vaccarella cc doe but not able to name one daily desired activity where breathing limits her, no sign cough or extra doses of combivent as rescue daytime - concerned mostly about how long it takes to use the nebulizer vs HFA which she is actually quite good at but cannot afford under medicare part D. rec Continue the nebulizer first thing each am but the second dose is optional Please see patient coordinator before you leave today  to schedule 02 at 2lpm then  recheck your saturations on 2lpm Zyrtec is the best allergy pill on the marked    09/12/2011 f/u ov/Mujtaba Bollig no longer wearing 02 at all and really can't tell any difference in terms of sleep quality.  No am ha and ams. Using combivent avg of 3 x daytime only with ok activity tol, no unsual cough or excess mucus production. No overt hb or sinus complaints or variability in terms of doe or need for combivent rec No change rx  12/14/2011 f/u ov/Bluford Sedler cc no change doe but other than bending over making a bed could not related one activity she does daily where her breathing slows her down, not using combivent but a few times a day at most and not clear it's helping.   rec Try adding spiriva one capsule each am to if it improves your activity tolerance going from point A to point B Continue the perforomist and budesonide twice daily (1st thing in am and then again 12 hours later)   01/11/2012 f/u ov/Isley Weisheit cc no question better on spiriva in terms of activity tolerance.  No obvious daytime variabilty or assoc chronic cough or cp or chest tightness, subjective wheeze overt sinus or hb symptoms. No unusual exp hx   Sleeping ok without nocturnal  or early am exacerbation  of respiratory  c/o's or need for noct saba. Also denies any obvious fluctuation of symptoms with weather or environmental changes or other aggravating  or alleviating factors except as outlined above.  ROS  The following are not active complaints unless bolded sore throat, dysphagia, dental problems, itching, sneezing,  nasal congestion or excess/ purulent secretions, ear ache,   fever, chills, sweats, unintended wt loss, pleuritic or exertional cp, hemoptysis,  orthopnea pnd or leg swelling, presyncope, palpitations, heartburn, abdominal pain, anorexia, nausea, vomiting, diarrhea  or change in bowel or urinary habits, change in stools or urine, dysuria,hematuria,  rash, arthralgias, visual complaints, headache, numbness weakness or ataxia or  problems with walking or coordination,  change in mood/affect or memory.            Objective:   Physical Exam  amb hoarse wf nad hopeless and helpless affect  Wt 122 04/04/2011 >   09/12/2011  125 > 12/14/2011 125> 01/11/2012  > 01/11/2012 126 HEENT mild turbinate edema.  Oropharynx no thrush or excess pnd or cobblestoning.  No JVD or cervical adenopathy. Mild accessory muscle hypertrophy. Trachea midline, nl thryroid. Chest was hyperinflated by percussion with diminished breath sounds and mild increased exp time without wheeze. Hoover sign positive at  End inspiration. Regular rate and rhythm without murmur gallop or rub or increase P2 or edema.  Abd: no hsm, nl excursion. Ext warm without cyanosis or clubbing.    03/31/11 cxr No change in hyperaeration. No active lung disease.     Assessment & Plan:

## 2012-04-27 DIAGNOSIS — D1801 Hemangioma of skin and subcutaneous tissue: Secondary | ICD-10-CM | POA: Diagnosis not present

## 2012-04-27 DIAGNOSIS — L821 Other seborrheic keratosis: Secondary | ICD-10-CM | POA: Diagnosis not present

## 2012-04-27 DIAGNOSIS — L820 Inflamed seborrheic keratosis: Secondary | ICD-10-CM | POA: Diagnosis not present

## 2012-04-27 DIAGNOSIS — D692 Other nonthrombocytopenic purpura: Secondary | ICD-10-CM | POA: Diagnosis not present

## 2012-04-27 DIAGNOSIS — L819 Disorder of pigmentation, unspecified: Secondary | ICD-10-CM | POA: Diagnosis not present

## 2012-05-04 ENCOUNTER — Ambulatory Visit: Payer: Medicare Other | Admitting: Internal Medicine

## 2012-05-07 ENCOUNTER — Telehealth: Payer: Self-pay | Admitting: Internal Medicine

## 2012-05-07 MED ORDER — FORMOTEROL FUMARATE 20 MCG/2ML IN NEBU: 20.0000 ug | INHALATION_SOLUTION | Freq: Two times a day (BID) | RESPIRATORY_TRACT | Status: DC

## 2012-05-07 MED ORDER — BUDESONIDE 0.25 MG/2ML IN SUSP: 0.2500 mg | Freq: Two times a day (BID) | RESPIRATORY_TRACT | Status: DC

## 2012-05-07 NOTE — Telephone Encounter (Addendum)
rx printed and placed in MW look-at to sign. Rx then can be sent to [facility]. Dr. Sherene Sires please give to nurse working with you. Thanks. Carron Curie, [name]

## 2012-05-08 NOTE — Telephone Encounter (Signed)
Done

## 2012-05-08 NOTE — Telephone Encounter (Signed)
RX has been faxed over to [place]. I called and made pt aware of this. Nothing further was needed

## 2012-05-25 ENCOUNTER — Ambulatory Visit: Payer: Medicare Other | Admitting: Internal Medicine

## 2012-06-11 ENCOUNTER — Encounter: Payer: Self-pay | Admitting: Internal Medicine

## 2012-06-11 ENCOUNTER — Ambulatory Visit (INDEPENDENT_AMBULATORY_CARE_PROVIDER_SITE_OTHER): Payer: Medicare Other | Admitting: Internal Medicine

## 2012-06-11 VITALS — BP 128/70 | HR 82 | Temp 97.6°F | Ht 61.0 in | Wt 127.2 lb

## 2012-06-11 DIAGNOSIS — J449 Chronic obstructive pulmonary disease, unspecified: Secondary | ICD-10-CM | POA: Diagnosis not present

## 2012-06-11 DIAGNOSIS — J9610 Chronic respiratory failure, unspecified whether with hypoxia or hypercapnia: Secondary | ICD-10-CM | POA: Diagnosis not present

## 2012-06-11 NOTE — Patient Instructions (Addendum)
Cancel 02 effective today  If you develop worse Am headaches or feeling whoozy in am's that improves quickly after you stir around or begin retaining fluid then I would recommend repeating overnight 02 on Room Air - call [facility] [phone]   Pulmonary follow up is as needed.

## 2012-06-13 NOTE — Assessment & Plan Note (Signed)
-   ONO RA 05/18/11 desat 18 min < 88%  rx 02 2lpm 06/13/2011 and then rechecked ono on 2lpm 06/14/11  > 17 min desat all above 80    - Declined to wear 02 qhs 09/12/2011     - Not using at all 06/11/2012 > d/c  Discussed what to look for in event noct desat worsens  See instructions for specific recommendations which were reviewed directly with the patient who was given a copy with highlighter outlining the key components.

## 2012-06-13 NOTE — Assessment & Plan Note (Signed)
-   Trial off ACEI rec 04/04/2011  > marked symptomatic improvement    - PFT's 05/02/2011 FEV1  0.69 (43%) ratio 43 and 18% better p B2, DLCO 57% corrects to 103    - Spiriva trial 12/14/2011 > improved ex tolerance  I had an extended summary discussion with the patient today lasting 15 to 20 minutes of a 25 minute visit on the following issues:   Treatment of copd in pts who have quit smoking remotely, which is the case with her, is all about effective symptom contol and preventing exac if applicable, which really isn't her problem.  She has severe dz but relatively stable, so pulmonary f/u can be prn

## 2012-06-13 NOTE — Progress Notes (Signed)
Subjective:    Patient ID: Sierra Warner, female    DOB: 02/12/1929    MRN: [id number]  Brief patient profile:  83 yowf quit smoking around 2007 due to concerns re risk of lung dz but actually no active symptoms or need for any inhalers at all  until 2010 when sob and cough onset so  Started symbicort and combivent and referred by Dr Jillyn Hidden 04/04/2011 to pulmonary clinic for refractory cough and sob on acei with GOLD III copd by pfts 04/2011.   04/04/2011  1 st pulmonary eval cc before Christmas  2012 breathing ok on symbicort / combivent happy x cost and "mouth and throat irriation"  then much worse abruptly since Christmas 2012 main complaint is throat and chest congestion but not excess mucus, no better with combivent, no purulent sputum assoc with nasal congestion. rec Stop lisinopril Start Benicar 20/12.5  One daily in place of lisinopril - it's the only way we can tell to what extent your present problems are being caused by your medication.    05/02/2011 f/u ov/Sierra Warner cc cough and throat irritation better, on symbicort but doesn't think she can afford it and off acei since 04/04/11,  No excess sputum production, confused with maint symbicort vs prn symbicort rec Performist 20 mcg and Budesonide 0.25 mg  twice daily with nebulizer Stay on Benicar 20 mg/ 12.5 mg daily until seen by [facility] Please see patient coordinator before you leave today  to schedule a home delivery nebulizer  Only use your combient  as a rescue medication  06/13/2011 f/u ov/Sierra Warner cc doe but not able to name one daily desired activity where breathing limits her, no sign cough or extra doses of combivent as rescue daytime - concerned mostly about how long it takes to use the nebulizer vs HFA which she is actually quite good at but cannot afford under medicare part D. rec Continue the nebulizer first thing each am but the second dose is optional Please see patient coordinator before you leave today  to schedule 02 at 2lpm then  recheck your saturations on 2lpm Zyrtec is the best allergy pill on the marked    09/12/2011 f/u ov/Sierra Warner no longer wearing 02 at all and really can't tell any difference in terms of sleep quality.  No am ha and ams. Using combivent avg of 3 x daytime only with ok activity tol, no unsual cough or excess mucus production. No overt hb or sinus complaints or variability in terms of doe or need for combivent rec No change rx  12/14/2011 f/u ov/Sierra Warner cc no change doe but other than bending over making a bed could not related one activity she does daily where her breathing slows her down, not using combivent but a few times a day at most and not clear it's helping.   rec Try adding spiriva one capsule each am to if it improves your activity tolerance going from point A to point B Continue the perforomist and budesonide twice daily (1st thing in am and then again 12 hours later)   01/11/2012 f/u ov/Sierra Warner cc no question better on spiriva in terms of activity tolerance. rec No change rx/ pulmonary f/u prn    06/11/12 Sierra Warner/ f/u ov cc   breathing is about the same. still having the sob,wheezing,productibve cough.> mucoid, mostly in ams.  Want to stop noct 02     No obvious daytime variabilty or assoc  cp or chest tightness, subjective wheeze overt sinus or hb symptoms. No unusual  exp hx   Sleeping ok without nocturnal  or early am exacerbation  of respiratory  c/o's or need for noct saba. Also denies any obvious fluctuation of symptoms with weather or environmental changes or other aggravating or alleviating factors except as outlined above.  ROS  The following are not active complaints unless bolded sore throat, dysphagia, dental problems, itching, sneezing,  nasal congestion or excess/ purulent secretions, ear ache,   fever, chills, sweats, unintended wt loss, pleuritic or exertional cp, hemoptysis,  orthopnea pnd or leg swelling, presyncope, palpitations, heartburn, abdominal pain, anorexia, nausea,  vomiting, diarrhea  or change in bowel or urinary habits, change in stools or urine, dysuria,hematuria,  rash, arthralgias, visual complaints, headache, numbness weakness or ataxia or problems with walking or coordination,  change in mood/affect or memory.            Objective:   Physical Exam  amb   wf nad hopeless and helpless affect  Wt 122 04/04/2011 >   09/12/2011  125 > 12/14/2011 125> 01/11/2012  > 01/11/2012 126>  127 06/11/12 HEENT mild turbinate edema.  Oropharynx no thrush or excess pnd or cobblestoning.  No JVD or cervical adenopathy. Mild accessory muscle hypertrophy. Trachea midline, nl thryroid. Chest was hyperinflated by percussion with diminished breath sounds and mild increased exp time without wheeze. Hoover sign positive at  End inspiration. Regular rate and rhythm without murmur gallop or rub or increase P2 or edema.  Abd: no hsm, nl excursion. Ext warm without cyanosis or clubbing.         Assessment & Plan:

## 2012-07-31 DIAGNOSIS — E785 Hyperlipidemia, unspecified: Secondary | ICD-10-CM | POA: Diagnosis not present

## 2012-07-31 DIAGNOSIS — J449 Chronic obstructive pulmonary disease, unspecified: Secondary | ICD-10-CM | POA: Diagnosis not present

## 2012-07-31 DIAGNOSIS — R0989 Other specified symptoms and signs involving the circulatory and respiratory systems: Secondary | ICD-10-CM | POA: Diagnosis not present

## 2012-07-31 DIAGNOSIS — J309 Allergic rhinitis, unspecified: Secondary | ICD-10-CM | POA: Diagnosis not present

## 2012-07-31 DIAGNOSIS — J4489 Other specified chronic obstructive pulmonary disease: Secondary | ICD-10-CM | POA: Diagnosis not present

## 2012-07-31 DIAGNOSIS — I10 Essential (primary) hypertension: Secondary | ICD-10-CM | POA: Diagnosis not present

## 2012-07-31 DIAGNOSIS — E559 Vitamin D deficiency, unspecified: Secondary | ICD-10-CM | POA: Diagnosis not present

## 2012-07-31 DIAGNOSIS — Z79899 Other long term (current) drug therapy: Secondary | ICD-10-CM | POA: Diagnosis not present

## 2012-08-21 DIAGNOSIS — R0989 Other specified symptoms and signs involving the circulatory and respiratory systems: Secondary | ICD-10-CM | POA: Diagnosis not present

## 2012-08-24 DIAGNOSIS — Z79899 Other long term (current) drug therapy: Secondary | ICD-10-CM | POA: Diagnosis not present

## 2012-08-24 DIAGNOSIS — E559 Vitamin D deficiency, unspecified: Secondary | ICD-10-CM | POA: Diagnosis not present

## 2012-08-24 DIAGNOSIS — R82998 Other abnormal findings in urine: Secondary | ICD-10-CM | POA: Diagnosis not present

## 2012-08-24 DIAGNOSIS — E785 Hyperlipidemia, unspecified: Secondary | ICD-10-CM | POA: Diagnosis not present

## 2012-08-24 DIAGNOSIS — I10 Essential (primary) hypertension: Secondary | ICD-10-CM | POA: Diagnosis not present

## 2012-08-30 DIAGNOSIS — L259 Unspecified contact dermatitis, unspecified cause: Secondary | ICD-10-CM | POA: Diagnosis not present

## 2012-09-10 ENCOUNTER — Telehealth: Payer: Self-pay | Admitting: Internal Medicine

## 2012-09-10 DIAGNOSIS — J449 Chronic obstructive pulmonary disease, unspecified: Secondary | ICD-10-CM

## 2012-09-10 DIAGNOSIS — J9610 Chronic respiratory failure, unspecified whether with hypoxia or hypercapnia: Secondary | ICD-10-CM

## 2012-09-10 NOTE — Telephone Encounter (Signed)
Per last OV with MW on 06/11/12:  Patient Instructions    Cancel 02 effective today  If you develop worse Am headaches or feeling whoozy in am's that improves quickly after you stir around or begin retaining fluid then I would recommend repeating overnight 02 on Room Air - call Almyra Free [phone]  Pulmonary follow up is as needed.   -------  Called, spoke with pt.  States she doesn't feel like she's getting any better.  She is having more trouble breathing when doing activity and qhs.  I offered OV, but pt declined.  States she just feels she needs to have an ONO on RA.  Denies HA in am but does state she feels whoozy at times but this isn't only in the am.  I have ordered ONO given above pt instructions from last OV and once again advised pt she should come in for OV.  Pt states "Well, he told me I didn't have to come back in" and didn't want to schedule at this time.  Will route msg to MW.

## 2012-09-11 NOTE — Telephone Encounter (Signed)
Pt is aware that the ONO order was placed with [facility] and that they will call her to set up

## 2012-09-11 NOTE — Telephone Encounter (Signed)
Fine to repeat on room air

## 2012-09-11 NOTE — Telephone Encounter (Signed)
LMOMTCB - Let pt know that MW ok to repeat ONO on RA - Order has been placed with Christoper Allegra

## 2012-09-17 DIAGNOSIS — J449 Chronic obstructive pulmonary disease, unspecified: Secondary | ICD-10-CM | POA: Diagnosis not present

## 2012-09-23 ENCOUNTER — Encounter: Payer: Self-pay | Admitting: Internal Medicine

## 2012-09-25 ENCOUNTER — Telehealth: Payer: Self-pay | Admitting: Internal Medicine

## 2012-09-25 NOTE — Telephone Encounter (Signed)
Spoke with pt and notified of results per Dr. Wert. Pt verbalized understanding and denied any questions. 

## 2012-09-25 NOTE — Telephone Encounter (Signed)
Per MW- ONO on RA normal- okay to d/c o2 LMTCB

## 2012-10-01 DIAGNOSIS — L84 Corns and callosities: Secondary | ICD-10-CM | POA: Diagnosis not present

## 2012-10-01 DIAGNOSIS — M2040 Other hammer toe(s) (acquired), unspecified foot: Secondary | ICD-10-CM | POA: Diagnosis not present

## 2012-10-17 DIAGNOSIS — H52209 Unspecified astigmatism, unspecified eye: Secondary | ICD-10-CM | POA: Diagnosis not present

## 2012-10-17 DIAGNOSIS — Z961 Presence of intraocular lens: Secondary | ICD-10-CM | POA: Diagnosis not present

## 2012-10-17 DIAGNOSIS — H43819 Vitreous degeneration, unspecified eye: Secondary | ICD-10-CM | POA: Diagnosis not present

## 2012-10-17 DIAGNOSIS — D3130 Benign neoplasm of unspecified choroid: Secondary | ICD-10-CM | POA: Diagnosis not present

## 2012-11-07 DIAGNOSIS — Z1231 Encounter for screening mammogram for malignant neoplasm of breast: Secondary | ICD-10-CM | POA: Diagnosis not present

## 2012-12-17 DIAGNOSIS — Z23 Encounter for immunization: Secondary | ICD-10-CM | POA: Diagnosis not present

## 2012-12-31 DIAGNOSIS — J441 Chronic obstructive pulmonary disease with (acute) exacerbation: Secondary | ICD-10-CM | POA: Diagnosis not present

## 2013-01-07 ENCOUNTER — Telehealth: Payer: Self-pay | Admitting: Internal Medicine

## 2013-01-07 MED ORDER — TIOTROPIUM BROMIDE MONOHYDRATE 18 MCG IN CAPS: 18.0000 ug | ORAL_CAPSULE | Freq: Every day | RESPIRATORY_TRACT | Status: DC

## 2013-01-07 NOTE — Telephone Encounter (Signed)
Rx has been sent in. Pt is aware. 

## 2013-01-08 DIAGNOSIS — R5381 Other malaise: Secondary | ICD-10-CM | POA: Diagnosis not present

## 2013-01-08 DIAGNOSIS — R059 Cough, unspecified: Secondary | ICD-10-CM | POA: Diagnosis not present

## 2013-01-08 DIAGNOSIS — R5383 Other fatigue: Secondary | ICD-10-CM | POA: Diagnosis not present

## 2013-01-08 DIAGNOSIS — R05 Cough: Secondary | ICD-10-CM | POA: Diagnosis not present

## 2013-01-08 DIAGNOSIS — J4489 Other specified chronic obstructive pulmonary disease: Secondary | ICD-10-CM | POA: Diagnosis not present

## 2013-01-08 DIAGNOSIS — J449 Chronic obstructive pulmonary disease, unspecified: Secondary | ICD-10-CM | POA: Diagnosis not present

## 2013-01-11 ENCOUNTER — Ambulatory Visit
Admission: RE | Admit: 2013-01-11 | Discharge: 2013-01-11 | Disposition: A | Discharge status: Home / Self Care | Payer: Medicare Other | Source: Ambulatory Visit | Attending: Family Medicine | Admitting: Family Medicine

## 2013-01-11 ENCOUNTER — Other Ambulatory Visit: Payer: Self-pay | Admitting: Family Medicine

## 2013-01-11 DIAGNOSIS — R05 Cough: Secondary | ICD-10-CM

## 2013-01-11 DIAGNOSIS — J449 Chronic obstructive pulmonary disease, unspecified: Secondary | ICD-10-CM

## 2013-01-11 DIAGNOSIS — J438 Other emphysema: Secondary | ICD-10-CM | POA: Diagnosis not present

## 2013-01-14 ENCOUNTER — Encounter: Payer: Self-pay | Admitting: Internal Medicine

## 2013-01-14 ENCOUNTER — Ambulatory Visit (INDEPENDENT_AMBULATORY_CARE_PROVIDER_SITE_OTHER): Payer: Medicare Other | Admitting: Internal Medicine

## 2013-01-14 VITALS — BP 142/78 | HR 96 | Temp 97.8°F | Ht 60.0 in | Wt 125.0 lb

## 2013-01-14 DIAGNOSIS — J449 Chronic obstructive pulmonary disease, unspecified: Secondary | ICD-10-CM | POA: Diagnosis not present

## 2013-01-14 NOTE — Progress Notes (Signed)
Subjective:    Patient ID: Sierra Warner, female    DOB: 09-08-1928    MRN: [id number]  Brief patient profile:  84 yowf quit smoking around 2007 due to concerns re risk of lung dz but actually no active symptoms or need for any inhalers at all  until 2010 when sob and cough onset so  Started symbicort and combivent and referred by Dr Jillyn Hidden 04/04/2011 to pulmonary clinic for refractory cough and sob on acei with GOLD II  copd by pfts 04/2011 with symptoms improved but not resolved off acei    History of Present Illness  04/04/2011  1 st pulmonary eval cc before Christmas  2012 breathing ok on symbicort / combivent happy x cost and "mouth and throat irriation"  then much worse abruptly since Christmas 2012 main complaint is throat and chest congestion but not excess mucus, no better with combivent, no purulent sputum assoc with nasal congestion. rec Stop lisinopril Start Benicar 20/12.5  One daily in place of lisinopril - it's the only way we can tell to what extent your present problems are being caused by your medication.    05/02/2011 f/u ov/[name] cc cough and throat irritation better, on symbicort but doesn't think she can afford it and off acei since 04/04/11,  No excess sputum production, confused with maint symbicort vs prn symbicort rec Performist 20 mcg and Budesonide 0.25 mg  twice daily with nebulizer Stay on Benicar 20 mg/ 12.5 mg daily until seen by [facility] Please see patient coordinator before you leave today  to schedule a home delivery nebulizer  Only use your combient  as a rescue medication  06/13/2011 f/u ov/[name] cc doe but not able to name one daily desired activity where breathing limits her, no sign cough or extra doses of combivent as rescue daytime - concerned mostly about how long it takes to use the nebulizer vs HFA which she is actually quite good at but cannot afford under medicare part D. rec Continue the nebulizer first thing each am but the second dose is optional Please see  patient coordinator before you leave today  to schedule 02 at 2lpm then recheck your saturations on 2lpm Zyrtec is the best allergy pill on the marked    09/12/2011 f/u ov/[name] no longer wearing 02 at all and really can't tell any difference in terms of sleep quality.  No am ha and ams. Using combivent avg of 3 x daytime only with ok activity tol, no unsual cough or excess mucus production. No overt hb or sinus complaints or variability in terms of doe or need for combivent rec No change rx  12/14/2011 f/u ov/[name] cc no change doe but other than bending over making a bed could not related one activity she does daily where her breathing slows her down, not using combivent but a few times a day at most and not clear it's helping.   rec Try adding spiriva one capsule each am to if it improves your activity tolerance going from point A to point B Continue the perforomist and budesonide twice daily (1st thing in am and then again 12 hours later)   01/11/2012 f/u ov/[name] cc no question better on spiriva in terms of activity tolerance. rec No change rx/ pulmonary f/u prn    01/14/2013 f/u ov/[name] re: GOLD II copd with reversibility   Chief Complaint  Patient presents with  . Follow-up    Pt c/o flu like symptoms, prod cough with white to yellow sputum and  increased SOB for the past 2.5 wks. Using combivent for rescue bid.   already on benzaoate Taking 2 benadryl at hs Already took zpak combivent avg three times a day until "got sick"  Then stopped it because afraid it might interfere with her other meds  No obvious daytime variabilty or assoc  cp or chest tightness, subjective wheeze overt sinus or hb symptoms. No unusual exp hx   Sleeping ok without nocturnal  or early am exacerbation  of respiratory  c/o's or need for noct saba. Also denies any obvious fluctuation of symptoms with weather or environmental changes or other aggravating or alleviating factors except as outlined above.  ROS   The following are not active complaints unless bolded sore throat, dysphagia, dental problems, itching, sneezing,  nasal congestion or excess/ purulent secretions, ear ache,   fever, chills, sweats, unintended wt loss, pleuritic or exertional cp, hemoptysis,  orthopnea pnd or leg swelling, presyncope, palpitations, heartburn, abdominal pain, anorexia, nausea, vomiting, diarrhea  or change in bowel or urinary habits, change in stools or urine, dysuria,hematuria,  rash, arthralgias, visual complaints, headache, numbness weakness or ataxia or problems with walking or coordination,  change in mood/affect or memory.            Objective:  Physical Exam  amb   wf nad hopeless and helpless affect confused with details of meds Wt 122 04/04/2011 >   09/12/2011  125 > 12/14/2011 125> 01/11/2012  > 01/11/2012 126>  127 06/11/12> 01/14/2013  125  HEENT mild turbinate edema.  Oropharynx no thrush or excess pnd or cobblestoning.  No JVD or cervical adenopathy. Mild accessory muscle hypertrophy. Trachea midline, nl thryroid. Chest was hyperinflated by percussion with diminished breath sounds and mild increased exp time without wheeze. Hoover sign positive at  End inspiration. Regular rate and rhythm without murmur gallop or rub or increase P2 or edema.  Abd: no hsm, nl excursion. Ext warm without cyanosis or clubbing.     cxr 01/11/13  1. Emphysema.  2. Chronic scarring in the right middle lobe.  3. Thoracic spondylosis.     Assessment & Plan:

## 2013-01-14 NOTE — Patient Instructions (Addendum)
Only use your combivent as a rescue medication to be used if you can't catch your breath by resting or doing a relaxed purse lip breathing pattern.  - The less you use it, the better it will work when you need it. - Ok to use up to every 6  hours if you must but call for immediate appointment if use goes up over your usual need - Don't leave home without it !!  (think of it like your spare tire for your car)   For cough mucinex dm up 1200 mg twice daily and stop benzoate (did not work)  If not better, Try prilosec 20mg   Take 30-60 min before first meal of the day and Pepcid 20 mg one bedtime until cough is completely gone for at least a week without the need for cough suppression  If still  not better >  See Tammy NP w/in 2 weeks with all your medications, even over the counter meds, separated in two separate bags, the ones you take no matter what vs the ones you stop once you feel better and take only as needed when you feel you need them.   Tammy  will generate for you a new user friendly medication calendar that will put [place] all on the same page re: your medication use.     Without this process, it simply isn't possible to assure that we are providing  your outpatient care  with  the attention to detail we feel you deserve.   If we cannot assure that you're getting that kind of care,  then we cannot manage your problem effectively from this clinic.  Once you have seen Tammy and we are sure that we're all on the same page with your medication use she will arrange follow up with me.

## 2013-01-15 NOTE — Assessment & Plan Note (Signed)
-   Trial off ACEI rec 04/04/2011  > marked symptomatic improvement    - PFT's 05/02/2011 FEV1  0.69 (43%) ratio 43 and 18% better p B2, DLCO 57% corrects to 103    - Spiriva trial 12/14/2011 > improved ex tolerance   Symptoms are markedly disproportionate to objective findings and not clear this is a lung problem but pt does appear to have difficult airway management issues. DDX of  difficult airways managment all start with A and  include Adherence, Ace Inhibitors, Acid Reflux, Active Sinus Disease, Alpha 1 Antitripsin deficiency, Anxiety masquerading as Airways dz,  ABPA,  allergy(esp in young), Aspiration (esp in elderly), Adverse effects of DPI,  Active smokers, plus two Bs  = Bronchiectasis and Beta blocker use..and one C= CHF  Adherence is always the initial "prime suspect" and is a multilayered concern that requires a "trust but verify" approach in every patient - starting with knowing how to use medications, especially inhalers, correctly, keeping up with refills and understanding the fundamental difference between maintenance and prns vs those medications only taken for a very short course and then stopped and not refilled. Very confused with details of care so best to keep it simple.  If she wants out help:   To keep things simple, I have asked the patient to first separate medicines that are perceived as maintenance, that is to be taken daily "no matter what", from those medicines that are taken on only on an as-needed basis and I have given the patient examples of both, and then return to see our NP to generate a  detailed  medication calendar which should be followed until the next physician sees the patient and updates it.    In meantime try to treat her persistent cough p uri with mucinex dm and if then not better ? Acid (or non-acid) GERD > always difficult to exclude as up to 75% of pts in some series report no assoc GI/ Heartburn symptoms> rec max (24h)  acid suppression and diet  restrictions/ reviewed and instructions given in writting

## 2013-01-16 DIAGNOSIS — E871 Hypo-osmolality and hyponatremia: Secondary | ICD-10-CM | POA: Diagnosis not present

## 2013-01-31 DIAGNOSIS — Z79899 Other long term (current) drug therapy: Secondary | ICD-10-CM | POA: Diagnosis not present

## 2013-01-31 DIAGNOSIS — R7309 Other abnormal glucose: Secondary | ICD-10-CM | POA: Diagnosis not present

## 2013-01-31 DIAGNOSIS — J4489 Other specified chronic obstructive pulmonary disease: Secondary | ICD-10-CM | POA: Diagnosis not present

## 2013-01-31 DIAGNOSIS — J449 Chronic obstructive pulmonary disease, unspecified: Secondary | ICD-10-CM | POA: Diagnosis not present

## 2013-01-31 DIAGNOSIS — I10 Essential (primary) hypertension: Secondary | ICD-10-CM | POA: Diagnosis not present

## 2013-02-05 DIAGNOSIS — J449 Chronic obstructive pulmonary disease, unspecified: Secondary | ICD-10-CM | POA: Diagnosis not present

## 2013-02-06 ENCOUNTER — Telehealth: Payer: Self-pay | Admitting: Internal Medicine

## 2013-02-06 DIAGNOSIS — R7301 Impaired fasting glucose: Secondary | ICD-10-CM | POA: Diagnosis not present

## 2013-02-06 NOTE — Telephone Encounter (Signed)
Pt is aware that we will have to talk to both MD's before we can schedule her with MR. She agreed and verbalized understanding.  MW - are you okay with pt switching to MR?

## 2013-02-06 NOTE — Telephone Encounter (Signed)
Fine to make switch   Dr. Kalman Shan, M.D., [facility].C.P Pulmonary and Critical Care Medicine Staff Physician [facility] [place] [facility] Pager: [phone], If no answer or between  15:00h - 7:00h: call [phone]  02/06/2013 12:35 PM

## 2013-02-06 NOTE — Telephone Encounter (Signed)
Will forward to MR for advisement.

## 2013-02-06 NOTE — Telephone Encounter (Signed)
I'm fine with this but I would very strongly rec Dr Marchelle Gearing having her see Sierra Warner first - she is very easily confused with meds/ instructions to point of intefering in the delivery of quality outpt medical care

## 2013-02-06 NOTE — Telephone Encounter (Signed)
Does she need to see TP prior? Please advise thanks

## 2013-02-07 NOTE — Telephone Encounter (Signed)
If  Patient agreeable to this have her see tP first. I too prefer that. If not, first avail with me only  Dr. Kalman Shan, M.D., [facility].C.P Pulmonary and Critical Care Medicine Staff Physician [facility] [place] Pul[facility] and Critical Care Pager: [phone], If no answer or between  15:00h - 7:00h: call [phone]  02/07/2013 2:52 PM

## 2013-02-07 NOTE — Telephone Encounter (Signed)
MR, does pt need to see TP first as recommended by MW?  Thanks.

## 2013-02-07 NOTE — Telephone Encounter (Signed)
I called and spoke with pt. She wanted to see MR. appt scheduled and nothing further needed

## 2013-02-26 ENCOUNTER — Other Ambulatory Visit: Payer: Medicare Other

## 2013-02-26 ENCOUNTER — Encounter (INDEPENDENT_AMBULATORY_CARE_PROVIDER_SITE_OTHER): Payer: Self-pay

## 2013-02-26 ENCOUNTER — Encounter: Payer: Self-pay | Admitting: Internal Medicine

## 2013-02-26 ENCOUNTER — Ambulatory Visit (INDEPENDENT_AMBULATORY_CARE_PROVIDER_SITE_OTHER): Payer: Medicare Other | Admitting: Internal Medicine

## 2013-02-26 VITALS — BP 140/80 | HR 88 | Ht 61.0 in | Wt 126.8 lb

## 2013-02-26 DIAGNOSIS — J449 Chronic obstructive pulmonary disease, unspecified: Secondary | ICD-10-CM

## 2013-02-26 DIAGNOSIS — Z23 Encounter for immunization: Secondary | ICD-10-CM | POA: Diagnosis not present

## 2013-02-26 DIAGNOSIS — J4489 Other specified chronic obstructive pulmonary disease: Secondary | ICD-10-CM | POA: Diagnosis not present

## 2013-02-26 NOTE — Patient Instructions (Addendum)
#  COPD - currently clinically stable and not in flare up but we can do thinkgs to get you feeling better - continue spiriva once daily  - continue noctyuranl oxygen  - continue performist and budesonide nebs as before - test alpha 1 genetic test for copd today - [name] will refer you to pulm rehab on basis of moderate copd - think about research trials we discussed - glad you are uptodate with flu shot  - have PREVNAR pneumonia vaccine 02/26/2013 based on fact last pneumonia vaccine (was > 1 year to 20 years ago   #Followup - do spirometry with Tammy Wilson in 2 months - 2 months  After doing spirometry 

## 2013-02-26 NOTE — Progress Notes (Signed)
Subjective:    Patient ID: Sierra Warner, female    DOB: [birth year], 79 y.o.   MRN: [id number] IOV 02/26/2013    HPI  OV 02/26/2013   Chief Complaint  Patient presents with  . Pulmonary Consult    switch from MW to MR. Pt c/o having SOB with activity, occasional dry cough, wheezing and chest tightness that is worse with colder weather.   . Medication Problem    pt states Spiriva is expensive, asking for samples.    Last seen by Dr. Casimiro Needle wert to [name] 01/14/2013. This is a followup for the same but is also switching providers to Dr. Marchelle Gearing which is myself today she reports an overall COPD stable although this winter 2014/2015 it appears the cold weather is bothering her more and she is more dyspneic than her baseline dyspnea. Dyspnea is exertional and relieved by rest. Distance in effort tolerance not clear. Cough is very minimal. She clearly indicates that she's not in a COPD exacerbation situation without worsening of cough of sputum production.  She gives history of 2-3 COPD exacerbation severe 2014 treated either pulmonary office with antibiotic and prednisone all of her primary care physician. She denies having had pulmonary rehabilitation. She's not had alpha-1 testing.  She was improve her quality of life. Currently she is being maintained on nocturnal oxygen which she does not use and along with Spiriva once daily and perform his in budesonide nebulizers with which he is compliant. These medications help her but she finds Spiriva difficult effort especially in the donut hole she wants samples. She's not sure should be interested in research trials      CAT COPD Symptom & Quality of Life Score (GSK trademark) 0 is no burden. 5 is highest burden 02/26/2013   Never Cough -> Cough all the time 3  No phlegm in chest -> Chest is full of phlegm 3  No chest tightness -> Chest feels very tight 3  No dyspnea for 1 flight stairs/hill -> Very dyspneic for 1 flight of  stairs 3  No limitations for ADL at home -> Very limited with ADL at home 2  Confident leaving home -> Not at all confident leaving home 0  Sleep soundly -> Do not sleep soundly because of lung condition 3  Lots of Energy -> No energy at all 3  TOTAL Score (max 40)  20     Pulmonary function test 05/02/2011 - [name]. FEV1 0.8 L/51% which is 18%  BD  response. FVC is 1.9/82%. Ratio 42.  DLCO 8.6/57%  -  reports that she quit smoking about 5 years ago. Her smoking use included Cigarettes. She has a 13.5 pack-year smoking history. She has never used smokeless tobacco. - CXR 01/12/12 - emphysemna, RML scarring, THoracic spondylosis  Past Medical History  Diagnosis Date  . Osteopenia   . Atrophic vaginitis   . Hemorrhoids   . COPD, mild   . Microscopic hematuria      Family History  Problem Relation Age of Onset  . Diabetes Mother   . Hypertension Mother   . Heart disease Father   . Hypertension Father   . Heart failure Father      History   Social History  . Marital Status: Widowed    Spouse Name: N/A    Number of Children: 2  . Years of Education: N/A   Occupational History  . Retired     Dance movement psychotherapist  History Main Topics  . Smoking status: Former Smoker -- 0.30 packs/day for 45 years    Types: Cigarettes    Quit date: 03/22/2007  . Smokeless tobacco: Never Used  . Alcohol Use: 2.0 oz/week    4 drink(s) per week  . Drug Use: No  . Sexual Activity: No   Other Topics Concern  . Not on file   Social History Narrative  . No narrative on file     Allergies  Allergen Reactions  . Sulfa Antibiotics      Outpatient Prescriptions Prior to Visit  Medication Sig Dispense Refill  . albuterol-ipratropium (COMBIVENT) 18-103 MCG/ACT inhaler Inhale 2 puffs into the lungs every 6 (six) hours as needed.      . budesonide (PULMICORT) 0.25 MG/2ML nebulizer solution Take 2 mLs (0.25 mg total) by nebulization 2 (two) times daily.  60 mL  11  . Calcium  Carbonate-Vitamin D (CALCIUM + D PO) Take by mouth 2 (two) times daily.        . diphenhydrAMINE (BENADRYL) 25 MG tablet Take 25 mg by mouth every 6 (six) hours as needed for allergies.      . formoterol (PERFOROMIST) 20 MCG/2ML nebulizer solution Take 2 mLs (20 mcg total) by nebulization 2 (two) times daily.  2 mL  11  . losartan-hydrochlorothiazide (HYZAAR) 50-12.5 MG per tablet Take 1 tablet by mouth daily.      . Multiple Vitamins-Minerals (MULTIVITAMIN GUMMIES ADULT PO) Take 2 tablets by mouth daily.      . Polyethylene Glycol 3350 (MIRALAX PO) Take 1 packet by mouth daily.       Marland Kitchen tiotropium (SPIRIVA) 18 MCG inhalation capsule Place 1 capsule (18 mcg total) into inhaler and inhale daily.  30 capsule  2  . VITAMIN D, CHOLECALCIFEROL, PO Take 1,000 Units by mouth daily.        No facility-administered medications prior to visit.       Review of Systems  Constitutional: Negative for fever and unexpected weight change.  HENT: Positive for congestion. Negative for dental problem, ear pain, nosebleeds, postnasal drip, rhinorrhea, sinus pressure, sneezing, sore throat and trouble swallowing.   Eyes: Negative for redness and itching.  Respiratory: Positive for cough and shortness of breath. Negative for chest tightness and wheezing.   Cardiovascular: Negative for palpitations and leg swelling.  Gastrointestinal: Positive for abdominal pain. Negative for nausea and vomiting.  Genitourinary: Negative for dysuria.  Musculoskeletal: Negative for joint swelling.  Skin: Negative for rash.  Neurological: Negative for headaches.  Hematological: Does not bruise/bleed easily.  Psychiatric/Behavioral: Positive for dysphoric mood. The patient is nervous/anxious.        Objective:   Physical Exam  Vitals reviewed. Constitutional: She is oriented to person, place, and time. She appears well-developed and well-nourished. No distress.  Body mass index is 23.97 kg/(m^2).   HENT:  Head:  Normocephalic and atraumatic.  Right Ear: External ear normal.  Left Ear: External ear normal.  Mouth/Throat: Oropharynx is clear and moist. No oropharyngeal exudate.  Eyes: Conjunctivae and EOM are normal. Pupils are equal, round, and reactive to light. Right eye exhibits no discharge. Left eye exhibits no discharge. No scleral icterus.  Neck: Normal range of motion. Neck supple. No JVD present. No tracheal deviation present. No thyromegaly present.  Cardiovascular: Normal rate, regular rhythm, normal heart sounds and intact distal pulses.  Exam reveals no gallop and no friction rub.   No murmur heard. Pulmonary/Chest: Effort normal and breath sounds normal. No respiratory distress. She  has no wheezes. She has no rales. She exhibits no tenderness.  Abdominal: Soft. Bowel sounds are normal. She exhibits no distension and no mass. There is no tenderness. There is no rebound and no guarding.  Musculoskeletal: Normal range of motion. She exhibits no edema and no tenderness.  OA + Mild antalgic gait +  Lymphadenopathy:    She has no cervical adenopathy.  Neurological: She is alert and oriented to person, place, and time. She has normal reflexes. No cranial nerve deficit. She exhibits normal muscle tone. Coordination normal.  Skin: Skin is warm and dry. No rash noted. She is not diaphoretic. No erythema. No pallor.  Psychiatric: She has a normal mood and affect. Her behavior is normal. Judgment and thought content normal.          Assessment & Plan:

## 2013-02-26 NOTE — Assessment & Plan Note (Signed)
#  COPD - currently clinically stable and not in flare up but we can do thinkgs to get you feeling better - continue spiriva once daily  - continue noctyuranl oxygen  - continue performist and budesonide nebs as before - test alpha 1 genetic test for copd today - [name] will refer you to pulm rehab on basis of moderate copd - think about research trials we discussed - glad you are uptodate with flu shot  - have PREVNAR pneumonia vaccine 02/26/2013 based on fact last pneumonia vaccine (was > 1 year to 20 years ago   #Followup - do spirometry with Jerolyn Shin in 2 months - 2 months  After doing spirometry

## 2013-02-27 LAB — ALPHA-1-ANTITRYPSIN: A-1 Antitrypsin, Ser: 120 mg/dL (ref 90–200)

## 2013-03-19 DIAGNOSIS — F341 Dysthymic disorder: Secondary | ICD-10-CM | POA: Diagnosis not present

## 2013-03-19 DIAGNOSIS — L988 Other specified disorders of the skin and subcutaneous tissue: Secondary | ICD-10-CM | POA: Diagnosis not present

## 2013-03-19 DIAGNOSIS — J449 Chronic obstructive pulmonary disease, unspecified: Secondary | ICD-10-CM | POA: Diagnosis not present

## 2013-03-19 DIAGNOSIS — J4489 Other specified chronic obstructive pulmonary disease: Secondary | ICD-10-CM | POA: Diagnosis not present

## 2013-03-27 ENCOUNTER — Inpatient Hospital Stay (HOSPITAL_COMMUNITY): Admission: RE | Admit: 2013-03-27 | Payer: Medicare Other | Source: Ambulatory Visit

## 2013-04-19 DIAGNOSIS — D1801 Hemangioma of skin and subcutaneous tissue: Secondary | ICD-10-CM | POA: Diagnosis not present

## 2013-05-03 ENCOUNTER — Other Ambulatory Visit: Payer: Self-pay | Admitting: *Deleted

## 2013-05-03 MED ORDER — FORMOTEROL FUMARATE 20 MCG/2ML IN NEBU: 20.0000 ug | INHALATION_SOLUTION | Freq: Two times a day (BID) | RESPIRATORY_TRACT | Status: DC

## 2013-05-03 MED ORDER — BUDESONIDE 0.25 MG/2ML IN SUSP: 0.2500 mg | Freq: Two times a day (BID) | RESPIRATORY_TRACT | Status: DC

## 2013-05-23 ENCOUNTER — Telehealth: Payer: Self-pay | Admitting: Internal Medicine

## 2013-05-23 NOTE — Telephone Encounter (Signed)
Spoke with Sierra Warner. Forms were faxed to [facility] to review & sign/date on 05/07/13 and again today (05/23/13). Faxed to [phone].  States they need these forms back ASAP.  Please advise Anderson Malta if you have seen these forms. Thanks!

## 2013-05-24 NOTE — Telephone Encounter (Signed)
Forms completed and faxed back on 05-23-13. Copy placed in scan folder.  [place], [name]

## 2013-05-28 DIAGNOSIS — D1801 Hemangioma of skin and subcutaneous tissue: Secondary | ICD-10-CM | POA: Diagnosis not present

## 2013-05-28 DIAGNOSIS — L821 Other seborrheic keratosis: Secondary | ICD-10-CM | POA: Diagnosis not present

## 2013-06-03 ENCOUNTER — Encounter: Payer: Self-pay | Admitting: Internal Medicine

## 2013-06-03 ENCOUNTER — Ambulatory Visit (INDEPENDENT_AMBULATORY_CARE_PROVIDER_SITE_OTHER): Payer: Medicare Other | Admitting: Internal Medicine

## 2013-06-03 VITALS — BP 150/80 | HR 113 | Ht 61.0 in | Wt 126.0 lb

## 2013-06-03 DIAGNOSIS — J449 Chronic obstructive pulmonary disease, unspecified: Secondary | ICD-10-CM

## 2013-06-03 DIAGNOSIS — J3089 Other allergic rhinitis: Secondary | ICD-10-CM

## 2013-06-03 LAB — PULMONARY FUNCTION TEST
DL/VA % PRED: 74 %
DL/VA: 3.27 ml/min/mmHg/L
DLCO unc % pred: 40 %
DLCO unc: 8.21 ml/min/mmHg
FEF 25-75 POST: 0.24 L/s
FEF 25-75 PRE: 0.26 L/s
FEF2575-%CHANGE-POST: -7 %
FEF2575-%PRED-PRE: 23 %
FEF2575-%Pred-Post: 21 %
FEV1-%Change-Post: -1 %
FEV1-%PRED-PRE: 42 %
FEV1-%Pred-Post: 42 %
FEV1-PRE: 0.67 L
FEV1-Post: 0.66 L
FEV1FVC-%Change-Post: -1 %
FEV1FVC-%PRED-PRE: 62 %
FEV6-%Change-Post: -1 %
FEV6-%PRED-POST: 69 %
FEV6-%Pred-Pre: 70 %
FEV6-Post: 1.39 L
FEV6-Pre: 1.41 L
FEV6FVC-%Change-Post: -2 %
FEV6FVC-%Pred-Post: 99 %
FEV6FVC-%Pred-Pre: 101 %
FVC-%Change-Post: 0 %
FVC-%PRED-POST: 69 %
FVC-%Pred-Pre: 69 %
FVC-POST: 1.48 L
FVC-Pre: 1.48 L
PRE FEV1/FVC RATIO: 45 %
Post FEV1/FVC ratio: 45 %
Post FEV6/FVC ratio: 94 %
Pre FEV6/FVC Ratio: 96 %
RV % pred: 129 %
RV: 2.98 L
TLC % PRED: 99 %
TLC: 4.57 L

## 2013-06-03 NOTE — Progress Notes (Signed)
Subjective:    Patient ID: Sierra Warner, female    DOB: 1928/04/26, 80 y.o.   MRN: [id number]  HPI   OV 02/26/2013   Chief Complaint  Patient presents with  . Pulmonary Consult    switch from [facility] to [facility]. Pt c/o having SOB with activity, occasional dry cough, wheezing and chest tightness that is worse with colder weather.   . Medication Problem    pt states Spiriva is expensive, asking for samples.    Last seen by Dr. [name] to [name] 01/14/2013. This is a followup for the same but is also switching providers to Dr. [name] which is myself today she reports an overall COPD stable although this winter 2014/2015 it appears the cold weather is bothering her more and she is more dyspneic than her baseline dyspnea. Dyspnea is exertional and relieved by rest. Distance in effort tolerance not clear. Cough is very minimal. She clearly indicates that she's not in a COPD exacerbation situation without worsening of cough of sputum production.  She gives history of 2-3 COPD exacerbation severe 2014 treated either pulmonary office with antibiotic and prednisone all of her primary care physician. She denies having had pulmonary rehabilitation. She's not had alpha-1 testing.  She wantss improve her quality of life. Currently she is being maintained on nocturnal oxygen which she does not use and along with Spiriva once daily and perform his in budesonide nebulizers with which he is compliant. These medications help her but she finds Spiriva difficult effort especially in the donut hole she wants samples. She's not sure should be interested in research trials     Pulmonary function test 05/02/2011 - [name]. FEV1 0.8 L/51% which is 18%  BD  response. FVC is 1.9/82%. Ratio 42.  DLCO 8.6/57%  -  reports that she quit smoking about 5 years ago. Her smoking use included Cigarettes. She has a 13.5 pack-year smoking history. She has never used smokeless tobacco. - CXR 01/12/12 -  emphysemna, RML scarring, THoracic spondy   REC #COPD - currently clinically stable and not in flare up but we can do thinkgs to get you feeling better - continue spiriva once daily  - continue noctyuranl oxygen  - continue performist and budesonide nebs as before - test alpha 1 genetic test for copd today - [name] will refer you to pulm rehab on basis of moderate copd - think about research trials we discussed - glad you are uptodate with flu shot  - have PREVNAR pneumonia vaccine 02/26/2013 based on fact last pneumonia vaccine (was > 1 year to 20 years ago)   #Followup - do spirometry with [name] in 2 months - 2 months  After doing spirometry   OV 06/03/2013  Chief Complaint  Patient presents with  . COPD    follow-up. Pt states she has been having increased productive cough with clear phlegm, increased SOB and chest tightness and wheezing x 2 weeks.     Followup for COPD. 2 years ago she was [name] but do pulmonary function test shows that she slipped and to [name] despite quitting smoking. Details are below. However, now that spring is here she feels that her allergies have kicked up. Historically that allergy season has been the worst for her. Last 2 weeks she's had increasing cough and mucus production but she does not feel that she is in a COPD flareup. No worsening wheeze or dyspnea. She not  interested in antibiotics or steroids. In addition, she feels that her nebulizer bronchodilator and steroids but extremely inconvenient to her lifestyle and actually makes her cough paradoxically. She loves her Spiriva and Combivent when necessary. She wants change of her nebulizers to an inhaler. She plans to start rehab in summer 2015  In terms of allergy workup: She does not want to be seen by an allergist for the time being  Pulmonary function test 06/03/2013 shows FEV1 0.66 L/42%. FVC 1.48 L or 69%. Ratio 45. DLCO of 8.2/40%. Findings are consistent with  [name]. Above numbers reflect post bronchodilator response which was 0%. In addition FEV1 also represent 140 cc loss of lung function in just under 2 years which is beyond age specific loss. This is despite quitting cigarette smoking 6 years ago  Alpha 1 levesl normla dec 2014 but no phenotyping done   reports that she quit smoking about 6 years ago. Her smoking use included Cigarettes. She has a 13.5 pack-year smoking history. She has never used smokeless tobacco.   CAT COPD Symptom & Quality of Life Score (GSK trademark) 0 is no burden. 5 is highest burden 02/26/2013   Never Cough -> Cough all the time 3  No phlegm in chest -> Chest is full of phlegm 3  No chest tightness -> Chest feels very tight 3  No dyspnea for 1 flight stairs/hill -> Very dyspneic for 1 flight of stairs 3  No limitations for ADL at home -> Very limited with ADL at home 2  Confident leaving home -> Not at all confident leaving home 0  Sleep soundly -> Do not sleep soundly because of lung condition 3  Lots of Energy -> No energy at all 3  TOTAL Score (max 40)  20       Review of Systems  Constitutional: Negative for fever and unexpected weight change.  HENT: Negative for congestion, dental problem, ear pain, nosebleeds, postnasal drip, rhinorrhea, sinus pressure, sneezing, sore throat and trouble swallowing.   Eyes: Negative for redness and itching.  Respiratory: Positive for cough, chest tightness, shortness of breath and wheezing.   Cardiovascular: Negative for palpitations and leg swelling.  Gastrointestinal: Negative for nausea and vomiting.  Genitourinary: Negative for dysuria.  Musculoskeletal: Negative for joint swelling.  Skin: Negative for rash.  Neurological: Negative for headaches.  Hematological: Does not bruise/bleed easily.  Psychiatric/Behavioral: Negative for dysphoric mood. The patient is not nervous/anxious.        Objective:   Physical Exam  Vitals  reviewed. Constitutional: She is oriented to person, place, and time. She appears well-developed and well-nourished. No distress.  Body mass index is 23.97 kg/(m^2).   HENT:  Head: Normocephalic and atraumatic.  Right Ear: External ear normal.  Left Ear: External ear normal.  Mouth/Throat: Oropharynx is clear and moist. No oropharyngeal exudate.  Eyes: Conjunctivae and EOM are normal. Pupils are equal, round, and reactive to light. Right eye exhibits no discharge. Left eye exhibits no discharge. No scleral icterus.  Neck: Normal range of motion. Neck supple. No JVD present. No tracheal deviation present. No thyromegaly present.  Cardiovascular: Normal rate, regular rhythm, normal heart sounds and intact distal pulses.  Exam reveals no gallop and no friction rub.   No murmur heard. Pulmonary/Chest: Effort normal and breath sounds normal. No respiratory distress. She has no wheezes. She has no rales. She exhibits no tenderness.  Abdominal: Soft. Bowel sounds are normal. She exhibits no distension and no mass. There is no  tenderness. There is no rebound and no guarding.  Musculoskeletal: Normal range of motion. She exhibits no edema and no tenderness.  OA + Mild antalgic gait +  Lymphadenopathy:    She has no cervical adenopathy.  Neurological: She is alert and oriented to person, place, and time. She has normal reflexes. No cranial nerve deficit. She exhibits normal muscle tone. Coordination normal.  Skin: Skin is warm and dry. No rash noted. She is not diaphoretic. No erythema. No pallor.  Psychiatric: She has a normal mood and affect. Her behavior is normal. Judgment and thought content normal.         Assessment & Plan:

## 2013-06-03 NOTE — Progress Notes (Signed)
PFT done today. 

## 2013-06-03 NOTE — Patient Instructions (Addendum)
#  COPD  - progressive lung function loss from feb 2013 through March 2015 but not in flare up - agree that nebulizer is inconvenient for you - change nebs to advair 100/50 1 puff twice daily - continue spiriva   #Allergies  - continue your zyrtec - if this is aproblem, in future I can refer you to allergy specialist  #FOllowup 4 months or sooner if needed

## 2013-06-09 DIAGNOSIS — J3089 Other allergic rhinitis: Secondary | ICD-10-CM | POA: Insufficient documentation

## 2013-06-09 NOTE — Assessment & Plan Note (Signed)
#  Allergies  - continue your zyrtec - if this is aproblem, in future I can refer you to allergy specialist  

## 2013-06-09 NOTE — Assessment & Plan Note (Signed)
#  COPD  - progressive lung function loss from feb 2013 through March 2015 but not in flare up - agree that nebulizer is inconvenient for you - change nebs to advair 100/50 1 puff twice daily - continue spiriva  #FOllowup 4 months or sooner if needed

## 2013-06-17 DIAGNOSIS — J449 Chronic obstructive pulmonary disease, unspecified: Secondary | ICD-10-CM | POA: Diagnosis not present

## 2013-06-17 DIAGNOSIS — Z79899 Other long term (current) drug therapy: Secondary | ICD-10-CM | POA: Diagnosis not present

## 2013-06-17 DIAGNOSIS — E785 Hyperlipidemia, unspecified: Secondary | ICD-10-CM | POA: Diagnosis not present

## 2013-06-17 DIAGNOSIS — J4489 Other specified chronic obstructive pulmonary disease: Secondary | ICD-10-CM | POA: Diagnosis not present

## 2013-06-17 DIAGNOSIS — I10 Essential (primary) hypertension: Secondary | ICD-10-CM | POA: Diagnosis not present

## 2013-06-17 DIAGNOSIS — E559 Vitamin D deficiency, unspecified: Secondary | ICD-10-CM | POA: Diagnosis not present

## 2013-06-28 DIAGNOSIS — E785 Hyperlipidemia, unspecified: Secondary | ICD-10-CM | POA: Diagnosis not present

## 2013-06-28 DIAGNOSIS — Z79899 Other long term (current) drug therapy: Secondary | ICD-10-CM | POA: Diagnosis not present

## 2013-06-28 DIAGNOSIS — E559 Vitamin D deficiency, unspecified: Secondary | ICD-10-CM | POA: Diagnosis not present

## 2013-06-28 DIAGNOSIS — J449 Chronic obstructive pulmonary disease, unspecified: Secondary | ICD-10-CM | POA: Diagnosis not present

## 2013-06-28 DIAGNOSIS — J4489 Other specified chronic obstructive pulmonary disease: Secondary | ICD-10-CM | POA: Diagnosis not present

## 2013-07-03 DIAGNOSIS — Z780 Asymptomatic menopausal state: Secondary | ICD-10-CM | POA: Diagnosis not present

## 2013-08-20 ENCOUNTER — Telehealth: Payer: Self-pay | Admitting: Internal Medicine

## 2013-08-20 MED ORDER — TIOTROPIUM BROMIDE MONOHYDRATE 18 MCG IN CAPS: 18.0000 ug | ORAL_CAPSULE | Freq: Every day | RESPIRATORY_TRACT | Status: DC

## 2013-08-20 MED ORDER — FLUTICASONE-SALMETEROL 100-50 MCG/DOSE IN AEPB: 1.0000 | INHALATION_SPRAY | Freq: Two times a day (BID) | RESPIRATORY_TRACT | Status: DC

## 2013-08-20 NOTE — Telephone Encounter (Signed)
1 sample of each up front for pick up  Spoke with the pt and notified that this was done  Nothing further needed

## 2013-09-30 ENCOUNTER — Telehealth (HOSPITAL_COMMUNITY): Payer: Self-pay

## 2013-10-01 ENCOUNTER — Ambulatory Visit (INDEPENDENT_AMBULATORY_CARE_PROVIDER_SITE_OTHER): Payer: Medicare Other | Admitting: Internal Medicine

## 2013-10-01 ENCOUNTER — Encounter: Payer: Self-pay | Admitting: Internal Medicine

## 2013-10-01 VITALS — BP 150/74 | HR 91 | Ht 61.0 in | Wt 127.0 lb

## 2013-10-01 DIAGNOSIS — J3089 Other allergic rhinitis: Secondary | ICD-10-CM | POA: Diagnosis not present

## 2013-10-01 DIAGNOSIS — J449 Chronic obstructive pulmonary disease, unspecified: Secondary | ICD-10-CM | POA: Diagnosis not present

## 2013-10-01 NOTE — Progress Notes (Signed)
Subjective:    Patient ID: Sierra Warner, female    DOB: [birth year], 80 y.o.   MRN: [id number]  HPI    OV 02/26/2013   Chief Complaint  Patient presents with  . Pulmonary Consult    switch from MW to MR. Pt c/o having SOB with activity, occasional dry cough, wheezing and chest tightness that is worse with colder weather.   . Medication Problem    pt states Spiriva is expensive, asking for samples.    Last seen by Sierra Warner wert to [name] 01/14/2013. This is a followup for the same but is also switching providers to Sierra Warner which is myself today she reports an overall COPD stable although this winter 2014/2015 it appears the cold weather is bothering her more and she is more dyspneic than her baseline dyspnea. Dyspnea is exertional and relieved by rest. Distance in effort tolerance not clear. Cough is very minimal. She clearly indicates that she's not in a COPD exacerbation situation without worsening of cough of sputum production.  She gives history of 2-3 COPD exacerbation severe 2014 treated either pulmonary office with antibiotic and prednisone all of her primary care physician. She denies having had pulmonary rehabilitation. She's not had alpha-1 testing.  She wantss improve her quality of life. Currently she is being maintained on nocturnal oxygen which she does not use and along with Spiriva once daily and perform his in budesonide nebulizers with which he is compliant. These medications help her but she finds Spiriva difficult effort especially in the donut hole she wants samples. She's not sure should be interested in research trials     Pulmonary function test 05/02/2011 - [name]. FEV1 0.8 L/51% which is 18%  BD  response. FVC is 1.9/82%. Ratio 42.  DLCO 8.6/57%  -  reports that she quit smoking about 5 years ago. Her smoking use included Cigarettes. She has a 13.5 pack-year smoking history. She has never used smokeless tobacco. - CXR 01/12/12 -  emphysemna, RML scarring, THoracic spondy   REC #COPD - currently clinically stable and not in flare up but we can do thinkgs to get you feeling better - continue spiriva once daily  - continue noctyuranl oxygen  - continue performist and budesonide nebs as before - test alpha 1 genetic test for copd today - [name] will refer you to pulm rehab on basis of moderate copd - think about research trials we discussed - glad you are uptodate with flu shot  - have PREVNAR pneumonia vaccine 02/26/2013 based on fact last pneumonia vaccine (was > 1 year to 20 years ago)   #Followup - do spirometry with Sierra Warner in 2 months - 2 months  After doing spirometry   OV 06/03/2013  Chief Complaint  Patient presents with  . COPD    follow-up. Pt states she has been having increased productive cough with clear phlegm, increased SOB and chest tightness and wheezing x 2 weeks.     Followup for COPD. 2 years ago she was [name] but do pulmonary function test shows that she slipped and to [name] despite quitting smoking. Details are below. However, now that spring is here she feels that her allergies have kicked up. Historically that allergy season has been the worst for her. Last 2 weeks she's had increasing cough and mucus production but she does not feel that she is in a COPD flareup. No worsening wheeze or dyspnea. She  not interested in antibiotics or steroids. In addition, she feels that her nebulizer bronchodilator and steroids but extremely inconvenient to her lifestyle and actually makes her cough paradoxically. She loves her Spiriva and Combivent when necessary. She wants change of her nebulizers to an inhaler. She plans to start rehab in summer 2015  In terms of allergy workup: She does not want to be seen by an allergist for the time being  Pulmonary function test 06/03/2013 shows FEV1 0.66 L/42%. FVC 1.48 L or 69%. Ratio 45. DLCO of 8.2/40%. Findings are consistent with  [name]. Above numbers reflect post bronchodilator response which was 0%. In addition FEV1 also represent 140 cc loss of lung function in just under 2 years which is beyond age specific loss. This is despite quitting cigarette smoking 6 years ago  Alpha 1 levesl normla dec 2014 but no phenotyping done   reports that she quit smoking about 6 years ago. Her smoking use included Cigarettes. She has a 13.5 pack-year smoking history. She has never used smokeless tobacco.  #COPD  - progressive lung function loss from feb 2013 through March 2015 but not in flare up - agree that nebulizer is inconvenient for you - change nebs to advair 100/50 1 puff twice daily - continue spiriva    #Allergies  - continue your zyrtec - if this is aproblem, in future I can refer you to allergy specialist  #FOllowup 4 months or sooner if    OV 10/01/2013  Chief Complaint  Patient presents with  . Follow-up    Pt states her breathing has worsened d/t hot weather. C/o increase SOB d/t weather. Denies cough and CP/tightness.    Followup [name]  - She is struggling to 5 inhaler regimen of Spiriva and Advair. Therefore she is has stopped Spiriva and is taking Advair only once a day. She is supplementing this by using her nebulizers at night. She's not happy with this regimen because of cost. At the same time because of this, his symptoms are slightly worse than baseline but she denies she is in flare up. She is also not compliant with using oxygen at night due to inconvenience reasons. At the same time she does not want to give up on the oxygen. She gives a somewhat he does making her more symptomatic with shortness of breath. I have agreed to help her with samples through the summer and then reassess in the fall as to what will be the best balance of cost and effective inhaler regimen   CAT COPD Symptom & Quality of Life Score ([employer] trademark) 0 is no burden. 5 is highest burden 02/26/2013    Never Cough -> Cough all the time 3  No phlegm in chest -> Chest is full of phlegm 3  No chest tightness -> Chest feels very tight 3  No dyspnea for 1 flight stairs/hill -> Very dyspneic for 1 flight of stairs 3  No limitations for ADL at home -> Very limited with ADL at home 2  Confident leaving home -> Not at all confident leaving home 0  Sleep soundly -> Do not sleep soundly because of lung condition 3  Lots of Energy -> No energy at all 3  TOTAL Score (max 40)  20    Review of Systems  Constitutional: Negative for fever and unexpected weight change.  HENT: Positive for sinus pressure. Negative for congestion, dental problem, ear pain, nosebleeds, postnasal drip, rhinorrhea, sneezing, sore throat and trouble  swallowing.   Eyes: Negative for redness and itching.  Respiratory: Positive for shortness of breath. Negative for cough, chest tightness and wheezing.   Cardiovascular: Negative for palpitations and leg swelling.  Gastrointestinal: Negative for nausea and vomiting.  Genitourinary: Negative for dysuria.  Musculoskeletal: Negative for joint swelling.  Skin: Negative for rash.  Neurological: Negative for headaches.  Hematological: Does not bruise/bleed easily.  Psychiatric/Behavioral: Negative for dysphoric mood. The patient is not nervous/anxious.        Objective:   Physical Exam  Vitals reviewed. Constitutional: She is oriented to person, place, and time. She appears well-developed and well-nourished. No distress.  HENT:  Head: Normocephalic and atraumatic.  Right Ear: External ear normal.  Left Ear: External ear normal.  Mouth/Throat: Oropharynx is clear and moist. No oropharyngeal exudate.  Eyes: Conjunctivae and EOM are normal. Pupils are equal, round, and reactive to light. Right eye exhibits no discharge. Left eye exhibits no discharge. No scleral icterus.  Neck: Normal range of motion. Neck supple. No JVD present. No tracheal deviation present. No thyromegaly  present.  Cardiovascular: Normal rate, regular rhythm, normal heart sounds and intact distal pulses.  Exam reveals no gallop and no friction rub.   No murmur heard. Pulmonary/Chest: Effort normal and breath sounds normal. No respiratory distress. She has no wheezes. She has no rales. She exhibits no tenderness.  Abdominal: Soft. Bowel sounds are normal. She exhibits no distension and no mass. There is no tenderness. There is no rebound and no guarding.  Musculoskeletal: Normal range of motion. She exhibits no edema and no tenderness.  Lymphadenopathy:    She has no cervical adenopathy.  Neurological: She is alert and oriented to person, place, and time. She has normal reflexes. No cranial nerve deficit. She exhibits normal muscle tone. Coordination normal.  Skin: Skin is warm and dry. No rash noted. She is not diaphoretic. No erythema. No pallor.  Psychiatric: She has a normal mood and affect. Her behavior is normal. Judgment and thought content normal.    Filed Vitals:   10/01/13 1141  BP: 150/74  Pulse: 91  Height: 5\' 1"  (1.549 m)  Weight: 127 lb (57.607 kg)  SpO2: 94%         Assessment & Plan:  #COPD   currently stable disease  - respect your decision not to use o2 at night; we discussed benefits and data on this - Due to summer making you worse   - restart spiriva 1 puff daily through fall 2015; then you can stop because of cost issues  - continue advair but instead of once a day; take it twice a day - agree that nebulizer is inconvenient for you so stop it  #Allergies  - continue your zyrtec - if this is aproblem, in future I can refer you to allergy specialist  #FOllowup 2 months with NP Tammy

## 2013-10-01 NOTE — Patient Instructions (Addendum)
#  COPD   currently stable disease  - respect your decision not to use o2 at night; we discussed benefits and  data on this - Due to summer making you worse   - restart spiriva 1 puff daily through fall 2015; then you can stop because of cost issues  - continue advair but instead of once a day; take it twice a day - agree that nebulizer is inconvenient for you so stop it  #Allergies  - continue your zyrtec - if this is aproblem, in future I can refer you to allergy specialist  #FOllowup 2 months with NP Tammy

## 2013-10-14 DIAGNOSIS — J449 Chronic obstructive pulmonary disease, unspecified: Secondary | ICD-10-CM | POA: Diagnosis not present

## 2013-10-14 DIAGNOSIS — I10 Essential (primary) hypertension: Secondary | ICD-10-CM | POA: Diagnosis not present

## 2013-10-14 DIAGNOSIS — J4489 Other specified chronic obstructive pulmonary disease: Secondary | ICD-10-CM | POA: Diagnosis not present

## 2013-10-21 NOTE — Assessment & Plan Note (Signed)
#  Allergies  - continue your zyrtec - if this is aproblem, in future I can refer you to allergy specialist

## 2013-10-21 NOTE — Assessment & Plan Note (Signed)
#  COPD   currently stable disease  - respect your decision not to use o2 at night; we discussed benefits and  data on this - Due to summer making you worse   - restart spiriva 1 puff daily through fall 2015; then you can stop because of cost issues  - continue advair but instead of once a day; take it twice a day - agree that nebulizer is inconvenient for you so stop it  #FOllowup 2 months with NP Tammy

## 2013-11-11 DIAGNOSIS — Z1231 Encounter for screening mammogram for malignant neoplasm of breast: Secondary | ICD-10-CM | POA: Diagnosis not present

## 2013-11-14 DIAGNOSIS — Z961 Presence of intraocular lens: Secondary | ICD-10-CM | POA: Diagnosis not present

## 2013-11-14 DIAGNOSIS — H524 Presbyopia: Secondary | ICD-10-CM | POA: Diagnosis not present

## 2013-11-14 DIAGNOSIS — D3130 Benign neoplasm of unspecified choroid: Secondary | ICD-10-CM | POA: Diagnosis not present

## 2013-11-14 DIAGNOSIS — H43819 Vitreous degeneration, unspecified eye: Secondary | ICD-10-CM | POA: Diagnosis not present

## 2013-11-26 ENCOUNTER — Encounter: Payer: Self-pay | Admitting: Adult Health

## 2013-11-26 ENCOUNTER — Ambulatory Visit (INDEPENDENT_AMBULATORY_CARE_PROVIDER_SITE_OTHER): Payer: Medicare Other | Admitting: Adult Health

## 2013-11-26 VITALS — BP 124/74 | HR 95 | Temp 98.3°F | Ht 61.0 in | Wt 127.4 lb

## 2013-11-26 DIAGNOSIS — Z23 Encounter for immunization: Secondary | ICD-10-CM | POA: Diagnosis not present

## 2013-11-26 DIAGNOSIS — J449 Chronic obstructive pulmonary disease, unspecified: Secondary | ICD-10-CM

## 2013-11-26 NOTE — Progress Notes (Signed)
Subjective:    Patient ID: Sierra Warner, female    DOB: 11/01/1928, 80 y.o.   MRN: [id number]  HPI    OV 02/26/2013   Chief Complaint  Patient presents with  . Pulmonary Consult    switch from MW to MR. Pt c/o having SOB with activity, occasional dry cough, wheezing and chest tightness that is worse with colder weather.   . Medication Problem    pt states Spiriva is expensive, asking for samples.    Last seen by Dr. [name] to [name] 01/14/2013. This is a followup for the same but is also switching providers to Dr. [name] which is myself today she reports an overall COPD stable although this winter 2014/2015 it appears the cold weather is bothering her more and she is more dyspneic than her baseline dyspnea. Dyspnea is exertional and relieved by rest. Distance in effort tolerance not clear. Cough is very minimal. She clearly indicates that she's not in a COPD exacerbation situation without worsening of cough of sputum production.  She gives history of 2-3 COPD exacerbation severe 2014 treated either pulmonary office with antibiotic and prednisone all of her primary care physician. She denies having had pulmonary rehabilitation. She's not had alpha-1 testing.  She wantss improve her quality of life. Currently she is being maintained on nocturnal oxygen which she does not use and along with Spiriva once daily and perform his in budesonide nebulizers with which he is compliant. These medications help her but she finds Spiriva difficult effort especially in the donut hole she wants samples. She's not sure should be interested in research trials     Pulmonary function test 05/02/2011 - [name]. FEV1 0.8 L/51% which is 18%  BD  response. FVC is 1.9/82%. Ratio 42.  DLCO 8.6/57%  -  reports that she quit smoking about 5 years ago. Her smoking use included Cigarettes. She has a 13.5 pack-year smoking history. She has never used smokeless tobacco. - CXR 01/12/12 -  emphysemna, RML scarring, THoracic spondy   REC #COPD - currently clinically stable and not in flare up but we can do thinkgs to get you feeling better - continue spiriva once daily  - continue noctyuranl oxygen  - continue performist and budesonide nebs as before - test alpha 1 genetic test for copd today - [name] will refer you to pulm rehab on basis of moderate copd - think about research trials we discussed - glad you are uptodate with flu shot  - have PREVNAR pneumonia vaccine 02/26/2013 based on fact last pneumonia vaccine (was > 1 year to 20 years ago)   #Followup - do spirometry with [name] in 2 months - 2 months  After doing spirometry   OV 06/03/2013  Chief Complaint  Patient presents with  . COPD    follow-up. Pt states she has been having increased productive cough with clear phlegm, increased SOB and chest tightness and wheezing x 2 weeks.     Followup for COPD. 2 years ago she was [name] but do pulmonary function test shows that she slipped and to [name] despite quitting smoking. Details are below. However, now that spring is here she feels that her allergies have kicked up. Historically that allergy season has been the worst for her. Last 2 weeks she's had increasing cough and mucus production but she does not feel that she is in a COPD flareup. No worsening wheeze or dyspnea. She  not interested in antibiotics or steroids. In addition, she feels that her nebulizer bronchodilator and steroids but extremely inconvenient to her lifestyle and actually makes her cough paradoxically. She loves her Spiriva and Combivent when necessary. She wants change of her nebulizers to an inhaler. She plans to start rehab in summer 2015  In terms of allergy workup: She does not want to be seen by an allergist for the time being  Pulmonary function test 06/03/2013 shows FEV1 0.66 L/42%. FVC 1.48 L or 69%. Ratio 45. DLCO of 8.2/40%. Findings are consistent with  [name]. Above numbers reflect post bronchodilator response which was 0%. In addition FEV1 also represent 140 cc loss of lung function in just under 2 years which is beyond age specific loss. This is despite quitting cigarette smoking 6 years ago  Alpha 1 levesl normla dec 2014 but no phenotyping done   reports that she quit smoking about 6 years ago. Her smoking use included Cigarettes. She has a 13.5 pack-year smoking history. She has never used smokeless tobacco.  #COPD  - progressive lung function loss from feb 2013 through March 2015 but not in flare up - agree that nebulizer is inconvenient for you - change nebs to advair 100/50 1 puff twice daily - continue spiriva    #Allergies  - continue your zyrtec - if this is aproblem, in future I can refer you to allergy specialist  #FOllowup 4 months or sooner if    OV 10/01/2013  Chief Complaint  Patient presents with  . Follow-up    Pt states her breathing has worsened d/t hot weather. C/o increase SOB d/t weather. Denies cough and CP/tightness.    Followup [name]  - She is struggling to 5 inhaler regimen of Spiriva and Advair. Therefore she is has stopped Spiriva and is taking Advair only once a day. She is supplementing this by using her nebulizers at night. She's not happy with this regimen because of cost. At the same time because of this, his symptoms are slightly worse than baseline but she denies she is in flare up. She is also not compliant with using oxygen at night due to inconvenience reasons. At the same time she does not want to give up on the oxygen. She gives a somewhat he does making her more symptomatic with shortness of breath. I have agreed to help her with samples through the summer and then reassess in the fall as to what will be the best balance of cost and effective inhaler regimen   CAT COPD Symptom & Quality of Life Score ([employer] trademark) 0 is no burden. 5 is highest burden 02/26/2013    Never Cough -> Cough all the time 3  No phlegm in chest -> Chest is full of phlegm 3  No chest tightness -> Chest feels very tight 3  No dyspnea for 1 flight stairs/hill -> Very dyspneic for 1 flight of stairs 3  No limitations for ADL at home -> Very limited with ADL at home 2  Confident leaving home -> Not at all confident leaving home 0  Sleep soundly -> Do not sleep soundly because of lung condition 3  Lots of Energy -> No energy at all 3  TOTAL Score (max 40)  20   11/26/2013 Follow Up COPD  Returns for COPD follow up .  Does Advair 1 puff in am, Perforomist At bedtime   Does Spiriva every other day. Seems to do very well for her  Wants to avoid Doughnut hole>meds are very expensive.  Overall feels she is doing okay .  No flare in cough, wheezing or shortness of breath No fever, chest pain edema or hemoptysis.  On O2 at bedtime.     Review of Systems  Constitutional: Negative for fever and unexpected weight change.  HENT:  Negative for congestion, dental problem, ear pain, nosebleeds, postnasal drip, rhinorrhea, sneezing, sore throat and trouble swallowing.   Eyes: Negative for redness and itching.  Respiratory: Positive for shortness of breath. Negative for cough, chest tightness and wheezing.   Cardiovascular: Negative for palpitations and leg swelling.  Gastrointestinal: Negative for nausea and vomiting.  Genitourinary: Negative for dysuria.  Musculoskeletal: Negative for joint swelling.  Skin: Negative for rash.  Neurological: Negative for headaches.  Hematological: Does not bruise/bleed easily.  Psychiatric/Behavioral: Negative for dysphoric mood. The patient is not nervous/anxious.        Objective:   Physical Exam  Vitals reviewed. Constitutional: She is oriented to person, place, and time. She appears well-developed and well-nourished. No distress.  HENT:  Head: Normocephalic and atraumatic.  Right Ear: External ear normal.  Left Ear: External ear normal.    Mouth/Throat: Oropharynx is clear and moist. No oropharyngeal exudate.  Eyes: Conjunctivae and EOM are normal. Pupils are equal, round, and reactive to light. Right eye exhibits no discharge. Left eye exhibits no discharge. No scleral icterus.  Neck: Normal range of motion. Neck supple. No JVD present. No tracheal deviation present. No thyromegaly present.  Cardiovascular: Normal rate, regular rhythm, normal heart sounds and intact distal pulses.  Exam reveals no gallop and no friction rub.   No murmur heard. Pulmonary/Chest: Effort normal and breath sounds normal. No respiratory distress. She has no wheezes. She has no rales. She exhibits no tenderness.  Abdominal: Soft. Bowel sounds are normal. She exhibits no distension and no mass. There is no tenderness. There is no rebound and no guarding.  Musculoskeletal: Normal range of motion. She exhibits no edema and no tenderness.  Lymphadenopathy:    She has no cervical adenopathy.  Neurological: She is alert and oriented to person, place, and time. She has normal reflexes. No cranial nerve deficit. She exhibits normal muscle tone. Coordination normal.  Skin: Skin is warm and dry. No rash noted. She is not diaphoretic. No erythema. No pallor.  Psychiatric: She has a normal mood and affect. Her behavior is normal. Judgment and thought content normal.          Assessment & Plan:

## 2013-11-26 NOTE — Patient Instructions (Signed)
Flu shot  Remain on current regimen for now to help save money.  Advair 1 puff daily in am  Perforomist neb daily in pm  Spiriva every other day  If your symptoms of shortness of breath or wheezing begin/worsen call and we can work you in and adjust your meds if needed.  Follow up Dr. [name] in  3 months and As needed

## 2013-11-29 NOTE — Assessment & Plan Note (Signed)
Compensated on present regimen   Plan  Flu shot  Remain on current regimen for now to help save money.  Advair 1 puff daily in am  Perforomist neb daily in pm  Spiriva every other day  If your symptoms of shortness of breath or wheezing begin/worsen call and we can work you in and adjust your meds if needed.  Follow up Dr. [name] in  3 months and As needed

## 2013-12-16 DIAGNOSIS — J441 Chronic obstructive pulmonary disease with (acute) exacerbation: Secondary | ICD-10-CM | POA: Diagnosis not present

## 2013-12-16 DIAGNOSIS — J069 Acute upper respiratory infection, unspecified: Secondary | ICD-10-CM | POA: Diagnosis not present

## 2013-12-19 DIAGNOSIS — J441 Chronic obstructive pulmonary disease with (acute) exacerbation: Secondary | ICD-10-CM | POA: Diagnosis not present

## 2013-12-20 ENCOUNTER — Encounter (HOSPITAL_COMMUNITY): Payer: Self-pay | Admitting: Emergency Medicine

## 2013-12-20 ENCOUNTER — Emergency Department (HOSPITAL_COMMUNITY): Payer: Medicare Other

## 2013-12-20 ENCOUNTER — Emergency Department (HOSPITAL_COMMUNITY)
Admission: EM | Admit: 2013-12-20 | Discharge: 2013-12-20 | Disposition: A | Discharge status: Home / Self Care | Payer: Medicare Other | Attending: Emergency Medicine | Admitting: Emergency Medicine

## 2013-12-20 DIAGNOSIS — R0602 Shortness of breath: Secondary | ICD-10-CM | POA: Diagnosis not present

## 2013-12-20 DIAGNOSIS — J441 Chronic obstructive pulmonary disease with (acute) exacerbation: Secondary | ICD-10-CM | POA: Insufficient documentation

## 2013-12-20 DIAGNOSIS — M8580 Other specified disorders of bone density and structure, unspecified site: Secondary | ICD-10-CM | POA: Insufficient documentation

## 2013-12-20 DIAGNOSIS — Z79899 Other long term (current) drug therapy: Secondary | ICD-10-CM | POA: Diagnosis not present

## 2013-12-20 DIAGNOSIS — Z8742 Personal history of other diseases of the female genital tract: Secondary | ICD-10-CM | POA: Insufficient documentation

## 2013-12-20 DIAGNOSIS — Z77098 Contact with and (suspected) exposure to other hazardous, chiefly nonmedicinal, chemicals: Secondary | ICD-10-CM | POA: Diagnosis not present

## 2013-12-20 DIAGNOSIS — F419 Anxiety disorder, unspecified: Secondary | ICD-10-CM | POA: Insufficient documentation

## 2013-12-20 DIAGNOSIS — H53122 Transient visual loss, left eye: Secondary | ICD-10-CM | POA: Insufficient documentation

## 2013-12-20 DIAGNOSIS — R000 Tachycardia, unspecified: Secondary | ICD-10-CM | POA: Diagnosis not present

## 2013-12-20 DIAGNOSIS — Z87891 Personal history of nicotine dependence: Secondary | ICD-10-CM | POA: Insufficient documentation

## 2013-12-20 DIAGNOSIS — H5712 Ocular pain, left eye: Secondary | ICD-10-CM

## 2013-12-20 DIAGNOSIS — H53132 Sudden visual loss, left eye: Secondary | ICD-10-CM | POA: Diagnosis not present

## 2013-12-20 DIAGNOSIS — Z7951 Long term (current) use of inhaled steroids: Secondary | ICD-10-CM | POA: Insufficient documentation

## 2013-12-20 DIAGNOSIS — H578 Other specified disorders of eye and adnexa: Secondary | ICD-10-CM | POA: Diagnosis not present

## 2013-12-20 DIAGNOSIS — T7840XA Allergy, unspecified, initial encounter: Secondary | ICD-10-CM | POA: Diagnosis not present

## 2013-12-20 DIAGNOSIS — R05 Cough: Secondary | ICD-10-CM | POA: Diagnosis not present

## 2013-12-20 DIAGNOSIS — Z8679 Personal history of other diseases of the circulatory system: Secondary | ICD-10-CM | POA: Insufficient documentation

## 2013-12-20 DIAGNOSIS — R0789 Other chest pain: Secondary | ICD-10-CM | POA: Diagnosis not present

## 2013-12-20 LAB — I-STAT CHEM 8, ED
BUN: 13 mg/dL (ref 6–23)
CHLORIDE: 95 meq/L — AB (ref 96–112)
Calcium, Ion: 1.14 mmol/L (ref 1.13–1.30)
Creatinine, Ser: 0.8 mg/dL (ref 0.50–1.10)
Glucose, Bld: 105 mg/dL — ABNORMAL HIGH (ref 70–99)
HEMATOCRIT: 44 % (ref 36.0–46.0)
Hemoglobin: 15 g/dL (ref 12.0–15.0)
Potassium: 4.1 mEq/L (ref 3.7–5.3)
Sodium: 129 mEq/L — ABNORMAL LOW (ref 137–147)
TCO2: 23 mmol/L (ref 0–100)

## 2013-12-20 LAB — CBC WITH DIFFERENTIAL/PLATELET
BASOS ABS: 0 10*3/uL (ref 0.0–0.1)
Basophils Relative: 0 % (ref 0–1)
Eosinophils Absolute: 0 10*3/uL (ref 0.0–0.7)
Eosinophils Relative: 0 % (ref 0–5)
HCT: 39 % (ref 36.0–46.0)
Hemoglobin: 13.6 g/dL (ref 12.0–15.0)
LYMPHS PCT: 11 % — AB (ref 12–46)
Lymphs Abs: 1.1 10*3/uL (ref 0.7–4.0)
MCH: 31.1 pg (ref 26.0–34.0)
MCHC: 34.9 g/dL (ref 30.0–36.0)
MCV: 89 fL (ref 78.0–100.0)
Monocytes Absolute: 0.9 10*3/uL (ref 0.1–1.0)
Monocytes Relative: 9 % (ref 3–12)
NEUTROS ABS: 8 10*3/uL — AB (ref 1.7–7.7)
Neutrophils Relative %: 80 % — ABNORMAL HIGH (ref 43–77)
PLATELETS: 349 10*3/uL (ref 150–400)
RBC: 4.38 MIL/uL (ref 3.87–5.11)
RDW: 13.1 % (ref 11.5–15.5)
WBC: 10 10*3/uL (ref 4.0–10.5)

## 2013-12-20 LAB — I-STAT TROPONIN, ED: TROPONIN I, POC: 0 ng/mL (ref 0.00–0.08)

## 2013-12-20 LAB — URINALYSIS, ROUTINE W REFLEX MICROSCOPIC
Bilirubin Urine: NEGATIVE
Glucose, UA: NEGATIVE mg/dL
KETONES UR: NEGATIVE mg/dL
Nitrite: NEGATIVE
PROTEIN: NEGATIVE mg/dL
Specific Gravity, Urine: 1.013 (ref 1.005–1.030)
UROBILINOGEN UA: 0.2 mg/dL (ref 0.0–1.0)
pH: 6.5 (ref 5.0–8.0)

## 2013-12-20 LAB — URINE MICROSCOPIC-ADD ON

## 2013-12-20 LAB — PRO B NATRIURETIC PEPTIDE: Pro B Natriuretic peptide (BNP): 543.2 pg/mL — ABNORMAL HIGH (ref 0–450)

## 2013-12-20 MED ORDER — TETRACAINE HCL 0.5 % OP SOLN
1.0000 [drp] | Freq: Once | OPHTHALMIC | Status: AC
  Administered 2013-12-20: 1 [drp] via OPHTHALMIC

## 2013-12-20 MED ORDER — FLUORESCEIN SODIUM 1 MG OP STRP
1.0000 | ORAL_STRIP | Freq: Once | OPHTHALMIC | Status: AC
  Administered 2013-12-20: 1 via OPHTHALMIC
  Filled 2013-12-20: qty 1

## 2013-12-20 MED ORDER — FLUORESCEIN SODIUM 1 MG OP STRP
1.0000 | ORAL_STRIP | Freq: Once | OPHTHALMIC | Status: AC
  Administered 2013-12-20: 1 via OPHTHALMIC

## 2013-12-20 MED ORDER — CIPROFLOXACIN HCL 0.3 % OP SOLN
1.0000 [drp] | OPHTHALMIC | Status: AC
  Administered 2013-12-20: 1 [drp] via OPHTHALMIC
  Filled 2013-12-20: qty 2.5

## 2013-12-20 MED ORDER — TETRACAINE HCL 0.5 % OP SOLN
1.0000 [drp] | Freq: Once | OPHTHALMIC | Status: AC
Start: 2013-12-20 — End: 2013-12-20
  Administered 2013-12-20: 1 [drp] via OPHTHALMIC
  Filled 2013-12-20: qty 2

## 2013-12-20 MED ORDER — ASPIRIN 81 MG PO CHEW
324.0000 mg | CHEWABLE_TABLET | Freq: Once | ORAL | Status: AC
  Administered 2013-12-20: 324 mg via ORAL
  Filled 2013-12-20: qty 4

## 2013-12-20 NOTE — ED Notes (Signed)
Gave pt a [place] sandwich packet

## 2013-12-20 NOTE — Discharge Instructions (Signed)
Use your inhaler every 4 hours  Use the eyedrops one drop in the left eye every 3 hours until you followup with the eye doctor  See your family doctor or the eye doctor in 2 days if no improvement, return to the hospital for severe or worsening symptoms including changes in vision, worsening pain or swelling, difficulty speaking and walking or with coordination.  Please call your doctor for a followup appointment within 24-48 hours. When you talk to your doctor please let them know that you were seen in the emergency department and have them acquire all of your records so that they can discuss the findings with you and formulate a treatment plan to fully care for your new and ongoing problems.

## 2013-12-20 NOTE — ED Notes (Signed)
Pt returned from ct

## 2013-12-20 NOTE — ED Notes (Addendum)
20/40 right eye 20/70 left eye
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
## 2013-12-20 NOTE — ED Notes (Signed)
Per EMS, pt c/o of irritation to her eyes and blurred vision, more in the left than the right. Pt A&OX4, NAD noted. Pt denies chest pain, weakness, slurred speech or dysphagia. Pt has h/o COPD. Pt has a cough. Pt was ambulatory off stretcher with no difficulty. VSS: BP 150/80, P112, R20, 95% rm air, CBG 112.

## 2013-12-20 NOTE — ED Notes (Signed)
Patient transported to X-ray 

## 2013-12-20 NOTE — ED Notes (Signed)
Pulse Ox while ambulating was 93

## 2013-12-20 NOTE — ED Notes (Signed)
Dr Miller at bedside. 
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
## 2013-12-21 NOTE — ED Provider Notes (Signed)
Medical screening examination/treatment/procedure(s) were conducted as a shared visit with non-physician practitioner(s) and myself.  I personally evaluated the patient during the encounter  Please see my separate respective documentation pertaining to this patient encounter   Johnna Acosta, MD 12/21/13 1136

## 2013-12-24 DIAGNOSIS — H5310 Unspecified subjective visual disturbances: Secondary | ICD-10-CM | POA: Diagnosis not present

## 2013-12-24 DIAGNOSIS — Z961 Presence of intraocular lens: Secondary | ICD-10-CM | POA: Diagnosis not present

## 2013-12-27 ENCOUNTER — Encounter: Payer: Self-pay | Admitting: Adult Health

## 2013-12-27 ENCOUNTER — Ambulatory Visit (INDEPENDENT_AMBULATORY_CARE_PROVIDER_SITE_OTHER): Payer: Medicare Other | Admitting: Adult Health

## 2013-12-27 VITALS — BP 132/70 | HR 117 | Temp 98.1°F | Ht 61.0 in | Wt 127.6 lb

## 2013-12-27 DIAGNOSIS — J449 Chronic obstructive pulmonary disease, unspecified: Secondary | ICD-10-CM

## 2013-12-27 NOTE — Patient Instructions (Signed)
Remain on current regimen for now to help save money.  Advair 1 puff daily in am  Budesonide / Perforomist neb daily in pm  May try Spiriva every other day  If your symptoms of shortness of breath or wheezing begin/worsen call and we can work you in and adjust your meds if needed.  Follow up Dr. [name] in  6-8 weeks  and As needed

## 2013-12-27 NOTE — Progress Notes (Signed)
Subjective:    Patient ID: Sierra Warner, female    DOB: 08/30/1928, 80 y.o.   MRN: [id number]  HPI    OV 02/26/2013   Chief Complaint  Patient presents with  . Pulmonary Consult    switch from MW to MR. Pt c/o having SOB with activity, occasional dry cough, wheezing and chest tightness that is worse with colder weather.   . Medication Problem    pt states Spiriva is expensive, asking for samples.    Last seen by Dr. [name] to [name] 01/14/2013. This is a followup for the same but is also switching providers to Dr. [name] which is myself today she reports an overall COPD stable although this winter 2014/2015 it appears the cold weather is bothering her more and she is more dyspneic than her baseline dyspnea. Dyspnea is exertional and relieved by rest. Distance in effort tolerance not clear. Cough is very minimal. She clearly indicates that she's not in a COPD exacerbation situation without worsening of cough of sputum production.  She gives history of 2-3 COPD exacerbation severe 2014 treated either pulmonary office with antibiotic and prednisone all of her primary care physician. She denies having had pulmonary rehabilitation. She's not had alpha-1 testing.  She wantss improve her quality of life. Currently she is being maintained on nocturnal oxygen which she does not use and along with Spiriva once daily and perform his in budesonide nebulizers with which he is compliant. These medications help her but she finds Spiriva difficult effort especially in the donut hole she wants samples. She's not sure should be interested in research trials     Pulmonary function test 05/02/2011 - [name]. FEV1 0.8 L/51% which is 18%  BD  response. FVC is 1.9/82%. Ratio 42.  DLCO 8.6/57%  -  reports that she quit smoking about 5 years ago. Her smoking use included Cigarettes. She has a 13.5 pack-year smoking history. She has never used smokeless tobacco. - CXR 01/12/12 -  emphysemna, RML scarring, THoracic spondy   REC #COPD - currently clinically stable and not in flare up but we can do thinkgs to get you feeling better - continue spiriva once daily  - continue noctyuranl oxygen  - continue performist and budesonide nebs as before - test alpha 1 genetic test for copd today - [name] will refer you to pulm rehab on basis of moderate copd - think about research trials we discussed - glad you are uptodate with flu shot  - have PREVNAR pneumonia vaccine 02/26/2013 based on fact last pneumonia vaccine (was > 1 year to 20 years ago)   #Followup - do spirometry with [name] in 2 months - 2 months  After doing spirometry   OV 06/03/2013  Chief Complaint  Patient presents with  . COPD    follow-up. Pt states she has been having increased productive cough with clear phlegm, increased SOB and chest tightness and wheezing x 2 weeks.     Followup for COPD. 2 years ago she was [name] but do pulmonary function test shows that she slipped and to [name] despite quitting smoking. Details are below. However, now that spring is here she feels that her allergies have kicked up. Historically that allergy season has been the worst for her. Last 2 weeks she's had increasing cough and mucus production but she does not feel that she is in a COPD flareup. No worsening wheeze or dyspnea. She  not interested in antibiotics or steroids. In addition, she feels that her nebulizer bronchodilator and steroids but extremely inconvenient to her lifestyle and actually makes her cough paradoxically. She loves her Spiriva and Combivent when necessary. She wants change of her nebulizers to an inhaler. She plans to start rehab in summer 2015  In terms of allergy workup: She does not want to be seen by an allergist for the time being  Pulmonary function test 06/03/2013 shows FEV1 0.66 L/42%. FVC 1.48 L or 69%. Ratio 45. DLCO of 8.2/40%. Findings are consistent with  [name]. Above numbers reflect post bronchodilator response which was 0%. In addition FEV1 also represent 140 cc loss of lung function in just under 2 years which is beyond age specific loss. This is despite quitting cigarette smoking 6 years ago  Alpha 1 levesl normla dec 2014 but no phenotyping done   reports that she quit smoking about 6 years ago. Her smoking use included Cigarettes. She has a 13.5 pack-year smoking history. She has never used smokeless tobacco.  #COPD  - progressive lung function loss from feb 2013 through March 2015 but not in flare up - agree that nebulizer is inconvenient for you - change nebs to advair 100/50 1 puff twice daily - continue spiriva    #Allergies  - continue your zyrtec - if this is aproblem, in future I can refer you to allergy specialist  #FOllowup 4 months or sooner if    OV 10/01/2013  Chief Complaint  Patient presents with  . Follow-up    Pt states her breathing has worsened d/t hot weather. C/o increase SOB d/t weather. Denies cough and CP/tightness.    Followup [name]  - She is struggling to 5 inhaler regimen of Spiriva and Advair. Therefore she is has stopped Spiriva and is taking Advair only once a day. She is supplementing this by using her nebulizers at night. She's not happy with this regimen because of cost. At the same time because of this, his symptoms are slightly worse than baseline but she denies she is in flare up. She is also not compliant with using oxygen at night due to inconvenience reasons. At the same time she does not want to give up on the oxygen. She gives a somewhat he does making her more symptomatic with shortness of breath. I have agreed to help her with samples through the summer and then reassess in the fall as to what will be the best balance of cost and effective inhaler regimen   CAT COPD Symptom & Quality of Life Score ([place] trademark) 0 is no burden. 5 is highest burden 02/26/2013    Never Cough -> Cough all the time 3  No phlegm in chest -> Chest is full of phlegm 3  No chest tightness -> Chest feels very tight 3  No dyspnea for 1 flight stairs/hill -> Very dyspneic for 1 flight of stairs 3  No limitations for ADL at home -> Very limited with ADL at home 2  Confident leaving home -> Not at all confident leaving home 0  Sleep soundly -> Do not sleep soundly because of lung condition 3  Lots of Energy -> No energy at all 3  TOTAL Score (max 40)  20   11/26/2013 Follow Up COPD  Returns for COPD follow up .  Does Advair 1 puff in am, Perforomist At bedtime   Does Spiriva every other day. Seems to do very well for her  Wants to avoid Doughnut hole>meds are very expensive.  Overall feels she is doing okay .  No flare in cough, wheezing or shortness of breath No fever, chest pain edema or hemoptysis.  On O2 at bedtime.  >flu shot   12/27/2013 Follow up  Complains over last 2 weeks of increased cough, congestion, wheezing and tightness.  Was seen by PCP last week with rx for Levaquin and Prednisone taper. Starting to feel better, cough and wheezing are less. Has finished her steroid and abx.   Also seen in ER on 10/2  with visual changes , underwent CT head that was neg. CXR w/ no acute process.  Feels she may have gotten some neb med mist  in her eye .  Right sided blurred vision resolved.  Denies f/c/s, n/v/d, hemoptysis, PND, leg swelling.      Review of Systems  Constitutional:   No  weight loss, night sweats,  Fevers, chills,  +fatigue, or  lassitude.  HEENT:   No headaches,  Difficulty swallowing,  Tooth/dental problems, or  Sore throat,                No sneezing, itching, ear ache, + nasal congestion, post nasal drip,   CV:  No chest pain,  Orthopnea, PND, swelling in lower extremities, anasarca, dizziness, palpitations, syncope.   GI  No heartburn, indigestion, abdominal pain, nausea, vomiting, diarrhea, change in bowel habits, loss of appetite, bloody  stools.   Resp:  .  No chest wall deformity  Skin: no rash or lesions.  GU: no dysuria, change in color of urine, no urgency or frequency.  No flank pain, no hematuria   MS:  No joint pain or swelling.  No decreased range of motion.  No back pain.  Psych:  No change in mood or affect. No depression or anxiety.  No memory loss.          Objective:   Physical Exam GEN: A/Ox3; pleasant , NAD, frail   HEENT:  /AT,  EACs-clear, TMs-wnl, NOSE-clear, THROAT-clear, no lesions, no postnasal drip or exudate noted.   NECK:  Supple w/ fair ROM; no JVD; normal carotid impulses w/o bruits; no thyromegaly or nodules palpated; no lymphadenopathy.  RESP  Decreased BS in bases w/o, wheezes/ rales/ or rhonchi.no accessory muscle use, no dullness to percussion  CARD:  RRR, no m/r/g  , no peripheral edema, pulses intact, no cyanosis or clubbing.  GI:   Soft & nt; nml bowel sounds; no organomegaly or masses detected.  Musco: Warm bil, no deformities or joint swelling noted.   Neuro: alert, no focal deficits noted.    Skin: Warm, no lesions or rashes          Assessment & Plan:

## 2013-12-27 NOTE — Assessment & Plan Note (Signed)
Resolving flare -no further abx or steroids at this time.  cxr this week with no acute process   Plan  Remain on current regimen for now to help save money.  Advair 1 puff daily in am  Budesonide / Perforomist neb daily in pm  May try Spiriva every other day  If your symptoms of shortness of breath or wheezing begin/worsen call and we can work you in and adjust your meds if needed.  Follow up Dr. [name] in  6-8 weeks  and As needed

## 2013-12-30 ENCOUNTER — Other Ambulatory Visit: Payer: Self-pay | Admitting: Internal Medicine

## 2014-01-20 ENCOUNTER — Encounter: Payer: Self-pay | Admitting: Adult Health

## 2014-02-12 ENCOUNTER — Ambulatory Visit (INDEPENDENT_AMBULATORY_CARE_PROVIDER_SITE_OTHER): Payer: Medicare Other | Admitting: Podiatry

## 2014-02-12 ENCOUNTER — Encounter: Payer: Self-pay | Admitting: Podiatry

## 2014-02-12 VITALS — BP 134/92 | HR 100 | Resp 12

## 2014-02-12 DIAGNOSIS — Q828 Other specified congenital malformations of skin: Secondary | ICD-10-CM | POA: Diagnosis not present
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
## 2014-02-17 ENCOUNTER — Encounter: Payer: Self-pay | Admitting: Internal Medicine

## 2014-02-17 ENCOUNTER — Ambulatory Visit (INDEPENDENT_AMBULATORY_CARE_PROVIDER_SITE_OTHER): Payer: Medicare Other | Admitting: Internal Medicine

## 2014-02-17 VITALS — BP 120/78 | HR 65 | Ht 61.0 in | Wt 128.0 lb

## 2014-02-17 DIAGNOSIS — J449 Chronic obstructive pulmonary disease, unspecified: Secondary | ICD-10-CM | POA: Diagnosis not present

## 2014-02-17 NOTE — Progress Notes (Signed)
Subjective:    Patient ID: Sierra Warner, female    DOB: Jul 23, 1928, 80 y.o.   MRN: [id number]  HPI  OV 02/26/2013   Chief Complaint  Patient presents with  . Pulmonary Consult    switch from MW to MR. Pt c/o having SOB with activity, occasional dry cough, wheezing and chest tightness that is worse with colder weather.   . Medication Problem    pt states Spiriva is expensive, asking for samples.    Last seen by Dr. [name] to [name] 01/14/2013. This is a followup for the same but is also switching providers to Dr. [name] which is myself today she reports an overall COPD stable although this winter 2014/2015 it appears the cold weather is bothering her more and she is more dyspneic than her baseline dyspnea. Dyspnea is exertional and relieved by rest. Distance in effort tolerance not clear. Cough is very minimal. She clearly indicates that she's not in a COPD exacerbation situation without worsening of cough of sputum production.  She gives history of 2-3 COPD exacerbation severe 2014 treated either pulmonary office with antibiotic and prednisone all of her primary care physician. She denies having had pulmonary rehabilitation. She's not had alpha-1 testing.  She wantss improve her quality of life. Currently she is being maintained on nocturnal oxygen which she does not use and along with Spiriva once daily and perform his in budesonide nebulizers with which he is compliant. These medications help her but she finds Spiriva difficult effort especially in the donut hole she wants samples. She's not sure should be interested in research trials     Pulmonary function test 05/02/2011 - [name]. FEV1 0.8 L/51% which is 18%  BD  response. FVC is 1.9/82%. Ratio 42.  DLCO 8.6/57%  -  reports that she quit smoking about 5 years ago. Her smoking use included Cigarettes. She has a 13.5 pack-year smoking history. She has never used smokeless tobacco. - CXR 01/12/12 -  emphysemna, RML scarring, THoracic spondy   REC #COPD - currently clinically stable and not in flare up but we can do thinkgs to get you feeling better - continue spiriva once daily  - continue noctyuranl oxygen  - continue performist and budesonide nebs as before - test alpha 1 genetic test for copd today - [name] will refer you to pulm rehab on basis of moderate copd - think about research trials we discussed - glad you are uptodate with flu shot  - have PREVNAR pneumonia vaccine 02/26/2013 based on fact last pneumonia vaccine (was > 1 year to 20 years ago)   #Followup - do spirometry with [name] in 2 months - 2 months  After doing spirometry   OV 06/03/2013  Chief Complaint  Patient presents with  . COPD    follow-up. Pt states she has been having increased productive cough with clear phlegm, increased SOB and chest tightness and wheezing x 2 weeks.     Followup for COPD. 2 years ago she was [name] but do pulmonary function test shows that she slipped and to [name] despite quitting smoking. Details are below. However, now that spring is here she feels that her allergies have kicked up. Historically that allergy season has been the worst for her. Last 2 weeks she's had increasing cough and mucus production but she does not feel that she is in a COPD flareup. No worsening wheeze or dyspnea. She not interested  in antibiotics or steroids. In addition, she feels that her nebulizer bronchodilator and steroids but extremely inconvenient to her lifestyle and actually makes her cough paradoxically. She loves her Spiriva and Combivent when necessary. She wants change of her nebulizers to an inhaler. She plans to start rehab in summer 2015  In terms of allergy workup: She does not want to be seen by an allergist for the time being  Pulmonary function test 06/03/2013 shows FEV1 0.66 L/42%. FVC 1.48 L or 69%. Ratio 45. DLCO of 8.2/40%. Findings are consistent with  [name]. Above numbers reflect post bronchodilator response which was 0%. In addition FEV1 also represent 140 cc loss of lung function in just under 2 years which is beyond age specific loss. This is despite quitting cigarette smoking 6 years ago  Alpha 1 levesl normla dec 2014 but no phenotyping done   reports that she quit smoking about 6 years ago. Her smoking use included Cigarettes. She has a 13.5 pack-year smoking history. She has never used smokeless tobacco.  #COPD  - progressive lung function loss from feb 2013 through March 2015 but not in flare up - agree that nebulizer is inconvenient for you - change nebs to advair 100/50 1 puff twice daily - continue spiriva    #Allergies  - continue your zyrtec - if this is aproblem, in future I can refer you to allergy specialist  #FOllowup 4 months or sooner if    OV 10/01/2013  Chief Complaint  Patient presents with  . Follow-up    Pt states her breathing has worsened d/t hot weather. C/o increase SOB d/t weather. Denies cough and CP/tightness.    Followup [name]  - She is struggling to 5 inhaler regimen of Spiriva and Advair. Therefore she is has stopped Spiriva and is taking Advair only once a day. She is supplementing this by using her nebulizers at night. She's not happy with this regimen because of cost. At the same time because of this, his symptoms are slightly worse than baseline but she denies she is in flare up. She is also not compliant with using oxygen at night due to inconvenience reasons. At the same time she does not want to give up on the oxygen. She gives a somewhat he does making her more symptomatic with shortness of breath. I have agreed to help her with samples through the summer and then reassess in the fall as to what will be the best balance of cost and effective inhaler regimen     11/26/2013 Follow Up COPD  Returns for COPD follow up .  Does Advair 1 puff in am, Perforomist At  bedtime   Does Spiriva every other day. Seems to do very well for her Wants to avoid Doughnut hole>meds are very expensive.  Overall feels she is doing okay .  No flare in cough, wheezing or shortness of breath No fever, chest pain edema or hemoptysis.  On O2 at bedtime.  >flu shot   12/27/2013 Follow up  Complains over last 2 weeks of increased cough, congestion, wheezing and tightness.  Was seen by PCP last week with rx for Levaquin and Prednisone taper. Starting to feel better, cough and wheezing are less. Has finished her steroid and abx.   Also seen in ER on 10/2  with visual changes , underwent CT head that was neg. CXR w/ no acute process.  Feels she may have gotten some neb med mist  in her eye .  Right sided blurred vision resolved.  Denies f/c/s, n/v/d, hemoptysis, PND, leg swelling.     OV 02/17/2014  Chief Complaint  Patient presents with  . Follow-up    Pt states her breathing is unchanged since last OV with TP. Pt c/o DOE. Pt denies cough and CP/tightness.    Follow-up Gold stage 3I COPD with reversibility ex-smoker   Overall COPD stable without any exacerbations since last visit with nurse practitioner 12/27/2013. She is now in the donut hole and wants samples. She is up-to-date with her vaccines. Her main issue is fatigue at the end of the day and particularly with exertion. She has never attended pulmonary rehabilitation even though I have counseled her to do so. She cites different logistical reasons even though she is retired. I discussed some recent studies within she's not interested   Past medical history reviewed: No issues no changes  CAT COPD Symptom & Quality of Life Score ([place]) 0 is no burden. 5 is highest burden 02/26/2013   Never Cough -> Cough all the time 3  No phlegm in chest -> Chest is full of phlegm 3  No chest tightness -> Chest feels very tight 3  No dyspnea for 1 flight stairs/hill -> Very dyspneic for 1 flight of stairs 3  No  limitations for ADL at home -> Very limited with ADL at home 2  Confident leaving home -> Not at all confident leaving home 0  Sleep soundly -> Do not sleep soundly because of lung condition 3  Lots of Energy -> No energy at all 3  TOTAL Score (max 40)  20    Immunization History  Administered Date(s) Administered  . Influenza Split 12/14/2011, 12/18/2012  . Influenza Whole 12/20/2010  . Influenza,inj,Quad PF,36+ Mos 11/26/2013  . Pneumococcal Conjugate-13 02/26/2013      Review of Systems  Constitutional: Negative for fever and unexpected weight change.  HENT: Negative for congestion, dental problem, ear pain, nosebleeds, postnasal drip, rhinorrhea, sinus pressure, sneezing, sore throat and trouble swallowing.   Eyes: Negative for redness and itching.  Respiratory: Positive for shortness of breath. Negative for cough, chest tightness and wheezing.   Cardiovascular: Negative for palpitations and leg swelling.  Gastrointestinal: Negative for nausea and vomiting.  Genitourinary: Negative for dysuria.  Musculoskeletal: Negative for joint swelling.  Skin: Negative for rash.  Neurological: Negative for headaches.  Hematological: Does not bruise/bleed easily.  Psychiatric/Behavioral: Negative for dysphoric mood. The patient is not nervous/anxious.    Current outpatient prescriptions: albuterol-ipratropium (COMBIVENT) 18-103 MCG/ACT inhaler, Inhale 2 puffs into the lungs every 6 (six) hours as needed., Disp: , Rfl: ;  budesonide (PULMICORT) 0.25 MG/2ML nebulizer solution, Take 0.25 mg by nebulization 2 (two) times daily., Disp: , Rfl: ;  Calcium Carbonate-Vitamin D (CALCIUM + D PO), Take by mouth 2 (two) times daily.  , Disp: , Rfl:  cholecalciferol (VITAMIN D) 1000 UNITS tablet, Take 1,000 Units by mouth daily., Disp: , Rfl: ;  diphenhydrAMINE (BENADRYL) 25 MG tablet, Take 25 mg by mouth every 6 (six) hours as needed for allergies., Disp: , Rfl: ;  Fluticasone-Salmeterol (ADVAIR) 100-50  MCG/DOSE AEPB, Inhale 1 puff into the lungs 2 (two) times daily., Disp: , Rfl:  formoterol (PERFOROMIST) 20 MCG/2ML nebulizer solution, Take 20 mcg by nebulization 2 (two) times daily., Disp: , Rfl: ;  losartan-hydrochlorothiazide (HYZAAR) 50-12.5 MG per tablet, Take 1 tablet by mouth daily., Disp: , Rfl: ;  Multiple Vitamins-Minerals (MULTIVITAMIN GUMMIES ADULT PO), Take 2 tablets by mouth daily.,  Disp: , Rfl: ;  Polyethylene Glycol 3350 (MIRALAX PO), Take 1 packet by mouth daily. , Disp: , Rfl:  SPIRIVA HANDIHALER 18 MCG inhalation capsule, PLACE 1 CAPSULE INTO THE INHALER AND INHALE DAILY (Patient not taking: Reported on 02/17/2014), Disp: 30 capsule, Rfl: 0      Objective:   Physical Exam  Constitutional: She is oriented to person, place, and time. She appears well-developed and well-nourished. No distress.  HENT:  Head: Normocephalic and atraumatic.  Right Ear: External ear normal.  Left Ear: External ear normal.  Mouth/Throat: Oropharynx is clear and moist. No oropharyngeal exudate.  Eyes: Conjunctivae and EOM are normal. Pupils are equal, round, and reactive to light. Right eye exhibits no discharge. Left eye exhibits no discharge. No scleral icterus.  Neck: Normal range of motion. Neck supple. No JVD present. No tracheal deviation present. No thyromegaly present.  Cardiovascular: Normal rate, regular rhythm, normal heart sounds and intact distal pulses.  Exam reveals no gallop and no friction rub.   No murmur heard. Pulmonary/Chest: Effort normal and breath sounds normal. No respiratory distress. She has no wheezes. She has no rales. She exhibits no tenderness.  Abdominal: Soft. Bowel sounds are normal. She exhibits no distension and no mass. There is no tenderness. There is no rebound and no guarding.  Musculoskeletal: Normal range of motion. She exhibits no edema or tenderness.  Lymphadenopathy:    She has no cervical adenopathy.  Neurological: She is alert and oriented to person,  place, and time. She has normal reflexes. No cranial nerve deficit. She exhibits normal muscle tone. Coordination normal.  Skin: Skin is warm and dry. No rash noted. She is not diaphoretic. No erythema. No pallor.  Psychiatric: She has a normal mood and affect. Her behavior is normal. Judgment and thought content normal.  Vitals reviewed.   Filed Vitals:   02/17/14 1336  BP: 120/78  Pulse: 65  Height: 5\' 1"  (1.549 m)  Weight: 128 lb (58.06 kg)  SpO2: 91%         Assessment & Plan:     ICD-9-CM ICD-10-CM   1. COPD, severe 496 J44.9    #COPD - currently stable disase but you have lot of associated fatigue which can only be improved by rehabilitation - Respiratory desire not to participate in COPD clinical research trials  REcommend - Remain on current regimen for now to help save money even though sub-optimal  - Advair 1 puff daily in am   - Budesonide / Perforomist neb daily in pm   - May try Spiriva every other day  - DEfinitely attend pulmonary rehabilitation  - please call them  - If your symptoms of shortness of breath or wheezing begin/worsen call and we can work you in and adjust your meds if needed.   Followup Follow up Dr. [name] in  6 months  and As needed    (> 50% of this 15 min visit spent in face to face counseling)   Dr. Brand Males, M.D., [facility].C.P Pulmonary and Critical Care Medicine Staff Physician [facility] Pager: [phone], If no answer or between  15:00h - 7:00h: call [phone]  02/17/2014 1:52 PM

## 2014-02-17 NOTE — Patient Instructions (Addendum)
#  COPD - currently stable disase but you have lot of associated fatigue which can only be improved by rehabilitation - Respiratory desire not to participate in COPD clinical research trials  REcommend - Remain on current regimen for now to help save money even though sub-optimal  - Advair 1 puff daily in am   - Budesonide / Perforomist neb daily in pm   - May try Spiriva every other day  - DEfinitely attend pulmonary rehabilitation  - please call them  - If your symptoms of shortness of breath or wheezing begin/worsen call and we can work you in and adjust your meds if needed.   Followup Follow up Dr. [name] in  6 months  and As needed

## 2014-06-03 DIAGNOSIS — D692 Other nonthrombocytopenic purpura: Secondary | ICD-10-CM | POA: Diagnosis not present

## 2014-06-03 DIAGNOSIS — L309 Dermatitis, unspecified: Secondary | ICD-10-CM | POA: Diagnosis not present

## 2014-06-03 DIAGNOSIS — L821 Other seborrheic keratosis: Secondary | ICD-10-CM | POA: Diagnosis not present

## 2014-06-04 ENCOUNTER — Telehealth: Payer: Self-pay | Admitting: Internal Medicine

## 2014-06-04 MED ORDER — BUDESONIDE 0.25 MG/2ML IN SUSP: 0.2500 mg | Freq: Two times a day (BID) | RESPIRATORY_TRACT | Status: DC

## 2014-06-04 MED ORDER — FORMOTEROL FUMARATE 20 MCG/2ML IN NEBU: 20.0000 ug | INHALATION_SOLUTION | Freq: Two times a day (BID) | RESPIRATORY_TRACT | Status: DC

## 2014-06-04 NOTE — Telephone Encounter (Signed)
Rx have been faxed to [facility]. Samples of Perforomist have been left at the front desk for pt to pick up.

## 2014-06-15 ENCOUNTER — Other Ambulatory Visit: Payer: Self-pay | Admitting: Adult Health

## 2014-06-16 ENCOUNTER — Telehealth: Payer: Self-pay | Admitting: Internal Medicine

## 2014-06-16 ENCOUNTER — Other Ambulatory Visit: Payer: Self-pay | Admitting: Internal Medicine

## 2014-06-16 NOTE — Telephone Encounter (Signed)
I checked MR's lookat and there was nothing there  Spoke with Sierra Warner and notified that we never received faxed She is going to refax this to the up front fax number  Nothing further needed

## 2014-06-24 DIAGNOSIS — I10 Essential (primary) hypertension: Secondary | ICD-10-CM | POA: Diagnosis not present

## 2014-06-24 DIAGNOSIS — Z79899 Other long term (current) drug therapy: Secondary | ICD-10-CM | POA: Diagnosis not present

## 2014-06-24 DIAGNOSIS — E785 Hyperlipidemia, unspecified: Secondary | ICD-10-CM | POA: Diagnosis not present

## 2014-06-24 DIAGNOSIS — M8580 Other specified disorders of bone density and structure, unspecified site: Secondary | ICD-10-CM | POA: Diagnosis not present

## 2014-06-24 DIAGNOSIS — J449 Chronic obstructive pulmonary disease, unspecified: Secondary | ICD-10-CM | POA: Diagnosis not present

## 2014-06-24 DIAGNOSIS — E559 Vitamin D deficiency, unspecified: Secondary | ICD-10-CM | POA: Diagnosis not present

## 2014-07-01 DIAGNOSIS — R319 Hematuria, unspecified: Secondary | ICD-10-CM | POA: Diagnosis not present

## 2014-07-01 DIAGNOSIS — Z79899 Other long term (current) drug therapy: Secondary | ICD-10-CM | POA: Diagnosis not present

## 2014-07-01 DIAGNOSIS — J449 Chronic obstructive pulmonary disease, unspecified: Secondary | ICD-10-CM | POA: Diagnosis not present

## 2014-07-01 DIAGNOSIS — I10 Essential (primary) hypertension: Secondary | ICD-10-CM | POA: Diagnosis not present

## 2014-07-01 DIAGNOSIS — E559 Vitamin D deficiency, unspecified: Secondary | ICD-10-CM | POA: Diagnosis not present

## 2014-07-01 DIAGNOSIS — E785 Hyperlipidemia, unspecified: Secondary | ICD-10-CM | POA: Diagnosis not present

## 2014-07-10 DIAGNOSIS — S81809D Unspecified open wound, unspecified lower leg, subsequent encounter: Secondary | ICD-10-CM | POA: Diagnosis not present

## 2014-07-10 DIAGNOSIS — L03115 Cellulitis of right lower limb: Secondary | ICD-10-CM | POA: Diagnosis not present

## 2014-07-16 DIAGNOSIS — S81809A Unspecified open wound, unspecified lower leg, initial encounter: Secondary | ICD-10-CM | POA: Diagnosis not present

## 2014-07-16 DIAGNOSIS — Z79899 Other long term (current) drug therapy: Secondary | ICD-10-CM | POA: Diagnosis not present

## 2014-07-21 DIAGNOSIS — E871 Hypo-osmolality and hyponatremia: Secondary | ICD-10-CM | POA: Diagnosis not present

## 2014-07-21 DIAGNOSIS — L0390 Cellulitis, unspecified: Secondary | ICD-10-CM | POA: Diagnosis not present

## 2014-07-21 DIAGNOSIS — I10 Essential (primary) hypertension: Secondary | ICD-10-CM | POA: Diagnosis not present

## 2014-08-04 DIAGNOSIS — E871 Hypo-osmolality and hyponatremia: Secondary | ICD-10-CM | POA: Diagnosis not present

## 2014-08-04 DIAGNOSIS — S81801D Unspecified open wound, right lower leg, subsequent encounter: Secondary | ICD-10-CM | POA: Diagnosis not present

## 2014-08-04 DIAGNOSIS — I10 Essential (primary) hypertension: Secondary | ICD-10-CM | POA: Diagnosis not present

## 2014-08-08 ENCOUNTER — Ambulatory Visit (INDEPENDENT_AMBULATORY_CARE_PROVIDER_SITE_OTHER): Payer: Medicare Other | Admitting: Adult Health

## 2014-08-08 VITALS — BP 140/76 | HR 100 | Temp 97.7°F | Wt 125.4 lb

## 2014-08-08 DIAGNOSIS — J449 Chronic obstructive pulmonary disease, unspecified: Secondary | ICD-10-CM

## 2014-08-08 MED ORDER — TIOTROPIUM BROMIDE MONOHYDRATE 2.5 MCG/ACT IN AERS: 1.0000 | INHALATION_SPRAY | Freq: Every day | RESPIRATORY_TRACT | Status: DC

## 2014-08-08 MED ORDER — FLUTICASONE-SALMETEROL 100-50 MCG/DOSE IN AEPB: 1.0000 | INHALATION_SPRAY | Freq: Two times a day (BID) | RESPIRATORY_TRACT | Status: DC

## 2014-08-08 NOTE — Patient Instructions (Addendum)
Continue on   - Advair 1 puff daily in am   - Budesonide / Perforomist neb daily in pm   -Spiriva every other day      Follow up Dr. [name] in 2  months  and As needed

## 2014-08-11 NOTE — Progress Notes (Signed)
Subjective:    Patient ID: Sierra Warner, female    DOB: Jul 23, 1928, 81 y.o.   MRN: [id number]  HPI  OV 02/26/2013   Chief Complaint  Patient presents with  . Pulmonary Consult    switch from MW to MR. Pt c/o having SOB with activity, occasional dry cough, wheezing and chest tightness that is worse with colder weather.   . Medication Problem    pt states Spiriva is expensive, asking for samples.    Last seen by Sierra Warner wert to [name] 01/14/2013. This is a followup for the same but is also switching providers to Sierra Warner which is myself today she reports an overall COPD stable although this winter 2014/2015 it appears the cold weather is bothering her more and she is more dyspneic than her baseline dyspnea. Dyspnea is exertional and relieved by rest. Distance in effort tolerance not clear. Cough is very minimal. She clearly indicates that she's not in a COPD exacerbation situation without worsening of cough of sputum production.  She gives history of 2-3 COPD exacerbation severe 2014 treated either pulmonary office with antibiotic and prednisone all of her primary care physician. She denies having had pulmonary rehabilitation. She's not had alpha-1 testing.  She wantss improve her quality of life. Currently she is being maintained on nocturnal oxygen which she does not use and along with Spiriva once daily and perform his in budesonide nebulizers with which he is compliant. These medications help her but she finds Spiriva difficult effort especially in the donut hole she wants samples. She's not sure should be interested in research trials     Pulmonary function test 05/02/2011 - [name]. FEV1 0.8 L/51% which is 18%  BD  response. FVC is 1.9/82%. Ratio 42.  DLCO 8.6/57%  -  reports that she quit smoking about 5 years ago. Her smoking use included Cigarettes. She has a 13.5 pack-year smoking history. She has never used smokeless tobacco. - CXR 01/12/12 -  emphysemna, RML scarring, THoracic spondy   REC #COPD - currently clinically stable and not in flare up but we can do thinkgs to get you feeling better - continue spiriva once daily  - continue noctyuranl oxygen  - continue performist and budesonide nebs as before - test alpha 1 genetic test for copd today - [name] will refer you to pulm rehab on basis of moderate copd - think about research trials we discussed - glad you are uptodate with flu shot  - have PREVNAR pneumonia vaccine 02/26/2013 based on fact last pneumonia vaccine (was > 1 year to 20 years ago)   #Followup - do spirometry with Sierra Warner in 2 months - 2 months  After doing spirometry   OV 06/03/2013  Chief Complaint  Patient presents with  . COPD    follow-up. Pt states she has been having increased productive cough with clear phlegm, increased SOB and chest tightness and wheezing x 2 weeks.     Followup for COPD. 2 years ago she was [name] but do pulmonary function test shows that she slipped and to [name] despite quitting smoking. Details are below. However, now that spring is here she feels that her allergies have kicked up. Historically that allergy season has been the worst for her. Last 2 weeks she's had increasing cough and mucus production but she does not feel that she is in a COPD flareup. No worsening wheeze or dyspnea. She not interested  in antibiotics or steroids. In addition, she feels that her nebulizer bronchodilator and steroids but extremely inconvenient to her lifestyle and actually makes her cough paradoxically. She loves her Spiriva and Combivent when necessary. She wants change of her nebulizers to an inhaler. She plans to start rehab in summer 2015  In terms of allergy workup: She does not want to be seen by an allergist for the time being  Pulmonary function test 06/03/2013 shows FEV1 0.66 L/42%. FVC 1.48 L or 69%. Ratio 45. DLCO of 8.2/40%. Findings are consistent with  [name]. Above numbers reflect post bronchodilator response which was 0%. In addition FEV1 also represent 140 cc loss of lung function in just under 2 years which is beyond age specific loss. This is despite quitting cigarette smoking 6 years ago  Alpha 1 levesl normla dec 2014 but no phenotyping done   reports that she quit smoking about 6 years ago. Her smoking use included Cigarettes. She has a 13.5 pack-year smoking history. She has never used smokeless tobacco.  #COPD  - progressive lung function loss from feb 2013 through March 2015 but not in flare up - agree that nebulizer is inconvenient for you - change nebs to advair 100/50 1 puff twice daily - continue spiriva    #Allergies  - continue your zyrtec - if this is aproblem, in future I can refer you to allergy specialist  #FOllowup 4 months or sooner if    OV 10/01/2013  Chief Complaint  Patient presents with  . Follow-up    Pt states her breathing has worsened d/t hot weather. C/o increase SOB d/t weather. Denies cough and CP/tightness.    Followup [name]  - She is struggling to 5 inhaler regimen of Spiriva and Advair. Therefore she is has stopped Spiriva and is taking Advair only once a day. She is supplementing this by using her nebulizers at night. She's not happy with this regimen because of cost. At the same time because of this, his symptoms are slightly worse than baseline but she denies she is in flare up. She is also not compliant with using oxygen at night due to inconvenience reasons. At the same time she does not want to give up on the oxygen. She gives a somewhat he does making her more symptomatic with shortness of breath. I have agreed to help her with samples through the summer and then reassess in the fall as to what will be the best balance of cost and effective inhaler regimen     11/26/2013 Follow Up COPD  Returns for COPD follow up .  Does Advair 1 puff in am, Perforomist At  bedtime   Does Spiriva every other day. Seems to do very well for her Wants to avoid Doughnut hole>meds are very expensive.  Overall feels she is doing okay .  No flare in cough, wheezing or shortness of breath No fever, chest pain edema or hemoptysis.  On O2 at bedtime.  >flu shot   12/27/2013 Follow up  Complains over last 2 weeks of increased cough, congestion, wheezing and tightness.  Was seen by PCP last week with rx for Levaquin and Prednisone taper. Starting to feel better, cough and wheezing are less. Has finished her steroid and abx.   Also seen in ER on 10/2  with visual changes , underwent CT head that was neg. CXR w/ no acute process.  Feels she may have gotten some neb med mist  in her eye .  Right sided blurred vision resolved.  Denies f/c/s, n/v/d, hemoptysis, PND, leg swelling.     OV 02/17/2014  Chief Complaint  Patient presents with  . Follow-up    Pt states her breathing is unchanged since last OV with TP. Pt c/o DOE. Pt denies cough and CP/tightness.    Follow-up Gold stage 3I COPD with reversibility ex-smoker   Overall COPD stable without any exacerbations since last visit with nurse practitioner 12/27/2013. She is now in the donut hole and wants samples. She is up-to-date with her vaccines. Her main issue is fatigue at the end of the day and particularly with exertion. She has never attended pulmonary rehabilitation even though I have counseled her to do so. She cites different logistical reasons even though she is retired. I discussed some recent studies within she's not interested   Past medical history reviewed: No issues no changes  CAT COPD Symptom & Quality of Life Score ([place]) 0 is no burden. 5 is highest burden 02/26/2013   Never Cough -> Cough all the time 3  No phlegm in chest -> Chest is full of phlegm 3  No chest tightness -> Chest feels very tight 3  No dyspnea for 1 flight stairs/hill -> Very dyspneic for 1 flight of stairs 3  No  limitations for ADL at home -> Very limited with ADL at home 2  Confident leaving home -> Not at all confident leaving home 0  Sleep soundly -> Do not sleep soundly because of lung condition 3  Lots of Energy -> No energy at all 3  TOTAL Score (max 40)  20    Immunization History  Administered Date(s) Administered  . Influenza Split 12/14/2011, 12/18/2012  . Influenza Whole 12/20/2010  . Influenza,inj,Quad PF,36+ Mos 11/26/2013  . Pneumococcal Conjugate-13 02/26/2013    08/08/14 Follow up : COPD  Pt returns for follow up .  Overall doing well with no flare of cough , dyspnea or wheeizng .  On Advair in am and budesonide /performist in pm Takes Spiriva every other day.  She uses this regimen to keep costs down.  Does get winded at time with activity.  CXR 12/2013 w/ no acute process.  PVX and Prevnar are utd .  No chest pain ,orthopnea, edema or fever.          Review of Systems  Constitutional: Negative for fever and unexpected weight change.  HENT: Negative for congestion, dental problem, ear pain, nosebleeds, postnasal drip, rhinorrhea, sinus pressure, sneezing, sore throat and trouble swallowing.   Eyes: Negative for redness and itching.  Respiratory: Positive for shortness of breath. Negative for cough, chest tightness and wheezing.   Cardiovascular: Negative for palpitations and leg swelling.  Gastrointestinal: Negative for nausea and vomiting.  Genitourinary: Negative for dysuria.  Musculoskeletal: Negative for joint swelling.  Skin: Negative for rash.  Neurological: Negative for headaches.  Hematological: Does not bruise/bleed easily.  Psychiatric/Behavioral: Negative for dysphoric mood. The patient is not nervous/anxious.       Objective:   Physical Exam  Constitutional: She is oriented to person, place, and time. She appears well-developed and well-nourished. No distress.  HENT:  Head: Normocephalic and atraumatic.  Right Ear: External ear normal.  Left  Ear: External ear normal.  Mouth/Throat: Oropharynx is clear and moist. No oropharyngeal exudate.  Eyes: Conjunctivae and EOM are normal. Pupils are equal, round, and reactive to light. Right eye exhibits no discharge. Left eye exhibits no discharge. No scleral icterus.  Neck: Normal range  of motion. Neck supple. No JVD present. No tracheal deviation present. No thyromegaly present.  Cardiovascular: Normal rate, regular rhythm, normal heart sounds and intact distal pulses.  Exam reveals no gallop and no friction rub.   No murmur heard. Pulmonary/Chest: Effort normal and breath sounds normal. No respiratory distress. She has no wheezes. She has no rales. She exhibits no tenderness.  Abdominal: Soft. Bowel sounds are normal. She exhibits no distension and no mass. There is no tenderness. There is no rebound and no guarding.  Musculoskeletal: Normal range of motion. She exhibits no edema or tenderness.  Lymphadenopathy:    She has no cervical adenopathy.  Neurological: She is alert and oriented to person, place, and time. She has normal reflexes. No cranial nerve deficit. She exhibits normal muscle tone. Coordination normal.  Skin: Skin is warm and dry. No rash noted. She is not diaphoretic. No erythema. No pallor.  Psychiatric: She has a normal mood and affect. Her behavior is normal. Judgment and thought content normal.  Vitals reviewed.         Assessment & Plan:

## 2014-08-12 MED ORDER — FLUTICASONE-SALMETEROL 100-50 MCG/DOSE IN AEPB: 1.0000 | INHALATION_SPRAY | Freq: Two times a day (BID) | RESPIRATORY_TRACT | Status: AC

## 2014-08-12 MED ORDER — TIOTROPIUM BROMIDE MONOHYDRATE 2.5 MCG/ACT IN AERS: 2.0000 | INHALATION_SPRAY | Freq: Every day | RESPIRATORY_TRACT | Status: AC

## 2014-08-13 NOTE — Assessment & Plan Note (Signed)
Compensated on present regimen  Regimen is working for pt -alternative to help save money  Plan  Continue on   - Advair 1 puff daily in am   - Budesonide / Perforomist neb daily in pm   -Spiriva every other day      Follow up Dr. [name] in 2  months  and As needed

## 2014-10-13 ENCOUNTER — Ambulatory Visit (INDEPENDENT_AMBULATORY_CARE_PROVIDER_SITE_OTHER): Payer: Medicare Other | Admitting: Internal Medicine

## 2014-10-13 ENCOUNTER — Encounter: Payer: Self-pay | Admitting: Internal Medicine

## 2014-10-13 VITALS — BP 142/68 | HR 94 | Ht 60.0 in | Wt 127.0 lb

## 2014-10-13 DIAGNOSIS — Z23 Encounter for immunization: Secondary | ICD-10-CM

## 2014-10-13 DIAGNOSIS — J449 Chronic obstructive pulmonary disease, unspecified: Secondary | ICD-10-CM

## 2014-10-13 NOTE — Patient Instructions (Addendum)
ICD-9-CM ICD-10-CM   1. COPD, severe 496 J44.9    - some worse due to heat but respecte desire to avoid prednisone  PLAN  Continue on your personal regimen of   - Advair 1 puff daily in am  + Budesonide / Perforomist neb daily in pm   -Spiriva every other day   Pneumovax 10/13/2014 FLu shot in fall 2016  REC  Follow up Dr. [name] in 6  months  and As needed

## 2014-10-13 NOTE — Progress Notes (Signed)
Subjective:    Patient ID: Sierra Warner, female    DOB: [birth year], 81 y.o.   MRN: [id number]  HPI    OV 02/26/2013   Chief Complaint  Patient presents with  . Pulmonary Consult    switch from MW to MR. Pt c/o having SOB with activity, occasional dry cough, wheezing and chest tightness that is worse with colder weather.   . Medication Problem    pt states Spiriva is expensive, asking for samples.    Last seen by Dr. [name] to [name] 01/14/2013. This is a followup for the same but is also switching providers to Dr. [name] which is myself today she reports an overall COPD stable although this winter 2014/2015 it appears the cold weather is bothering her more and she is more dyspneic than her baseline dyspnea. Dyspnea is exertional and relieved by rest. Distance in effort tolerance not clear. Cough is very minimal. She clearly indicates that she's not in a COPD exacerbation situation without worsening of cough of sputum production.  She gives history of 2-3 COPD exacerbation severe 2014 treated either pulmonary office with antibiotic and prednisone all of her primary care physician. She denies having had pulmonary rehabilitation. She's not had alpha-1 testing.  She wantss improve her quality of life. Currently she is being maintained on nocturnal oxygen which she does not use and along with Spiriva once daily and perform his in budesonide nebulizers with which he is compliant. These medications help her but she finds Spiriva difficult effort especially in the donut hole she wants samples. She's not sure should be interested in research trials     Pulmonary function test 05/02/2011 - [name]. FEV1 0.8 L/51% which is 18%  BD  response. FVC is 1.9/82%. Ratio 42.  DLCO 8.6/57%  -  reports that she quit smoking about 5 years ago. Her smoking use included Cigarettes. She has a 13.5 pack-year smoking history. She has never used smokeless tobacco. - CXR 01/12/12 -  emphysemna, RML scarring, THoracic spondy   REC #COPD - currently clinically stable and not in flare up but we can do thinkgs to get you feeling better - continue spiriva once daily  - continue noctyuranl oxygen  - continue performist and budesonide nebs as before - test alpha 1 genetic test for copd today - [name] will refer you to pulm rehab on basis of moderate copd - think about research trials we discussed - glad you are uptodate with flu shot  - have PREVNAR pneumonia vaccine 02/26/2013 based on fact last pneumonia vaccine (was > 1 year to 20 years ago)   #Followup - do spirometry with [name] in 2 months - 2 months  After doing spirometry   OV 06/03/2013  Chief Complaint  Patient presents with  . COPD    follow-up. Pt states she has been having increased productive cough with clear phlegm, increased SOB and chest tightness and wheezing x 2 weeks.     Followup for COPD. 2 years ago she was [name] but do pulmonary function test shows that she slipped and to [name] despite quitting smoking. Details are below. However, now that spring is here she feels that her allergies have kicked up. Historically that allergy season has been the worst for her. Last 2 weeks she's had increasing cough and mucus production but she does not feel that she is in a COPD flareup. No worsening wheeze or dyspnea. She  not interested in antibiotics or steroids. In addition, she feels that her nebulizer bronchodilator and steroids but extremely inconvenient to her lifestyle and actually makes her cough paradoxically. She loves her Spiriva and Combivent when necessary. She wants change of her nebulizers to an inhaler. She plans to start rehab in summer 2015  In terms of allergy workup: She does not want to be seen by an allergist for the time being  Pulmonary function test 06/03/2013 shows FEV1 0.66 L/42%. FVC 1.48 L or 69%. Ratio 45. DLCO of 8.2/40%. Findings are consistent with  [name]. Above numbers reflect post bronchodilator response which was 0%. In addition FEV1 also represent 140 cc loss of lung function in just under 2 years which is beyond age specific loss. This is despite quitting cigarette smoking 6 years ago  Alpha 1 levesl normla dec 2014 but no phenotyping done   reports that she quit smoking about 6 years ago. Her smoking use included Cigarettes. She has a 13.5 pack-year smoking history. She has never used smokeless tobacco.  #COPD  - progressive lung function loss from feb 2013 through March 2015 but not in flare up - agree that nebulizer is inconvenient for you - change nebs to advair 100/50 1 puff twice daily - continue spiriva    #Allergies  - continue your zyrtec - if this is aproblem, in future I can refer you to allergy specialist  #FOllowup 4 months or sooner if    OV 10/01/2013  Chief Complaint  Patient presents with  . Follow-up    Pt states her breathing has worsened d/t hot weather. C/o increase SOB d/t weather. Denies cough and CP/tightness.    Followup [name]  - She is struggling to 5 inhaler regimen of Spiriva and Advair. Therefore she is has stopped Spiriva and is taking Advair only once a day. She is supplementing this by using her nebulizers at night. She's not happy with this regimen because of cost. At the same time because of this, his symptoms are slightly worse than baseline but she denies she is in flare up. She is also not compliant with using oxygen at night due to inconvenience reasons. At the same time she does not want to give up on the oxygen. She gives a somewhat he does making her more symptomatic with shortness of breath. I have agreed to help her with samples through the summer and then reassess in the fall as to what will be the best balance of cost and effective inhaler regimen     11/26/2013 Follow Up COPD  Returns for COPD follow up .  Does Advair 1 puff in am, Perforomist At  bedtime   Does Spiriva every other day. Seems to do very well for her Wants to avoid Doughnut hole>meds are very expensive.  Overall feels she is doing okay .  No flare in cough, wheezing or shortness of breath No fever, chest pain edema or hemoptysis.  On O2 at bedtime.  >flu shot   12/27/2013 Follow up  Complains over last 2 weeks of increased cough, congestion, wheezing and tightness.  Was seen by PCP last week with rx for Levaquin and Prednisone taper. Starting to feel better, cough and wheezing are less. Has finished her steroid and abx.   Also seen in ER on 10/2  with visual changes , underwent CT head that was neg. CXR w/ no acute process.  Feels she may have gotten some neb med mist  in  her eye .  Right sided blurred vision resolved.  Denies f/c/s, n/v/d, hemoptysis, PND, leg swelling.     OV 02/17/2014  Chief Complaint  Patient presents with  . Follow-up    Pt states her breathing is unchanged since last OV with TP. Pt c/o DOE. Pt denies cough and CP/tightness.    Follow-up Gold stage 3I COPD with reversibility ex-smoker   Overall COPD stable without any exacerbations since last visit with nurse practitioner 12/27/2013. She is now in the donut hole and wants samples. She is up-to-date with her vaccines. Her main issue is fatigue at the end of the day and particularly with exertion. She has never attended pulmonary rehabilitation even though I have counseled her to do so. She cites different logistical reasons even though she is retired. I discussed some recent studies within she's not interested   Past medical history reviewed: No issues no changes    08/08/14 Follow up : COPD  Pt returns for follow up .  Overall doing well with no flare of cough , dyspnea or wheeizng .  On Advair in am and budesonide /performist in pm Takes Spiriva every other day.  She uses this regimen to keep costs down.  Does get winded at time with activity.  CXR 12/2013 w/ no acute process.    PVX and Prevnar are utd .  No chest pain ,orthopnea, edema or fever.    OV 10/13/2014  Chief Complaint  Patient presents with  . Follow-up    Pt states her breathing has worsened and she thinks its due to the weather. Pt c/o denies cough and CP/tightness.     Follow-up [name]  She continues on her personal cocktail regimen of Spiriva alternating with long-acting beta agonist/inhale corticosteroid-induced other day. The long-acting beta agonist inhaled cortical steroidal fluctuates between Advair in the morning and nebulizer in the evening. Overall COPD stable but with the recent heat and humidity she's having worsening shortness of breath but no change in cough or sputum production or wheezing or edema or chest pain. She does not want steroidal's. She facial just lives with her heat and try to get better by staying in the air-conditioned room   CAT COPD Symptom & Quality of Life Score ([employer] trademark) 0 is no burden. 5 is highest burden 02/26/2013   Never Cough -> Cough all the time 3  No phlegm in chest -> Chest is full of phlegm 3  No chest tightness -> Chest feels very tight 3  No dyspnea for 1 flight stairs/hill -> Very dyspneic for 1 flight of stairs 3  No limitations for ADL at home -> Very limited with ADL at home 2  Confident leaving home -> Not at all confident leaving home 0  Sleep soundly -> Do not sleep soundly because of lung condition 3  Lots of Energy -> No energy at all 3  TOTAL Score (max 40)  20    Immunization History  Administered Date(s) Administered  . Influenza Split 12/14/2011, 12/18/2012  . Influenza Whole 12/20/2010  . Influenza,inj,Quad PF,36+ Mos 11/26/2013  . Pneumococcal Conjugate-13 02/26/2013     Review of Systems  Constitutional: Negative for fever and unexpected weight change.  HENT: Negative for congestion, dental problem, ear pain, nosebleeds, postnasal drip, rhinorrhea, sinus pressure, sneezing, sore throat and trouble  swallowing.   Eyes: Negative for redness and itching.  Respiratory: Positive for shortness of breath. Negative for cough, chest tightness and wheezing.   Cardiovascular: Negative for  palpitations and leg swelling.  Gastrointestinal: Negative for nausea and vomiting.  Genitourinary: Negative for dysuria.  Musculoskeletal: Negative for joint swelling.  Skin: Negative for rash.  Neurological: Negative for headaches.  Hematological: Does not bruise/bleed easily.  Psychiatric/Behavioral: Negative for dysphoric mood. The patient is not nervous/anxious.        Objective:   Physical Exam  Constitutional: She is oriented to person, place, and time. She appears well-developed and well-nourished. No distress.  HENT:  Head: Normocephalic and atraumatic.  Right Ear: External ear normal.  Left Ear: External ear normal.  Mouth/Throat: Oropharynx is clear and moist. No oropharyngeal exudate.  Mild purse lip breathing +  Eyes: Conjunctivae and EOM are normal. Pupils are equal, round, and reactive to light. Right eye exhibits no discharge. Left eye exhibits no discharge. No scleral icterus.  Neck: Normal range of motion. Neck supple. No JVD present. No tracheal deviation present. No thyromegaly present.  Cardiovascular: Normal rate, regular rhythm, normal heart sounds and intact distal pulses.  Exam reveals no gallop and no friction rub.   No murmur heard. Pulmonary/Chest: Effort normal and breath sounds normal. No respiratory distress. She has no wheezes. She has no rales. She exhibits no tenderness.  Abdominal: Soft. Bowel sounds are normal. She exhibits no distension and no mass. There is no tenderness. There is no rebound and no guarding.  Musculoskeletal: Normal range of motion. She exhibits no edema or tenderness.  Lymphadenopathy:    She has no cervical adenopathy.  Neurological: She is alert and oriented to person, place, and time. She has normal reflexes. No cranial nerve deficit. She exhibits  normal muscle tone. Coordination normal.  Skin: Skin is warm and dry. No rash noted. She is not diaphoretic. No erythema. No pallor.  Psychiatric: She has a normal mood and affect. Her behavior is normal. Judgment and thought content normal.  Vitals reviewed.   Filed Vitals:   10/13/14 1409  BP: 142/68  Pulse: 94  Height: 5' (1.524 m)  Weight: 127 lb (57.607 kg)  SpO2: 95%          Assessment & Plan:  COPD, severe  - some worse due to heat but respecte desire to avoid prednisone  PLAN  Continue on your personal regimen of   - Advair 1 puff daily in am  + Budesonide / Perforomist neb daily in pm   -Spiriva every other day   Pneumovax 10/13/2014 FLu shot in fall 2016  REC  Follow up Dr. [name] in 6  months  and As needed     Dr. Brand Males, M.D., [facility].C.P Pulmonary and Critical Care Medicine Staff Physician [facility] Pager: [phone], If no answer or between  15:00h - 7:00h: call [phone]  10/13/2014 2:31 PM

## 2014-10-21 IMAGING — CR DG CHEST 2V
1 series · 1 of 1 positions shown · non-contrast
Comparison: Chest radiograph 01/11/2013

CLINICAL DATA: Blurred vision.  Cough.  Chest tightness.

EXAM:
CHEST  2 VIEW

[w chest lat]
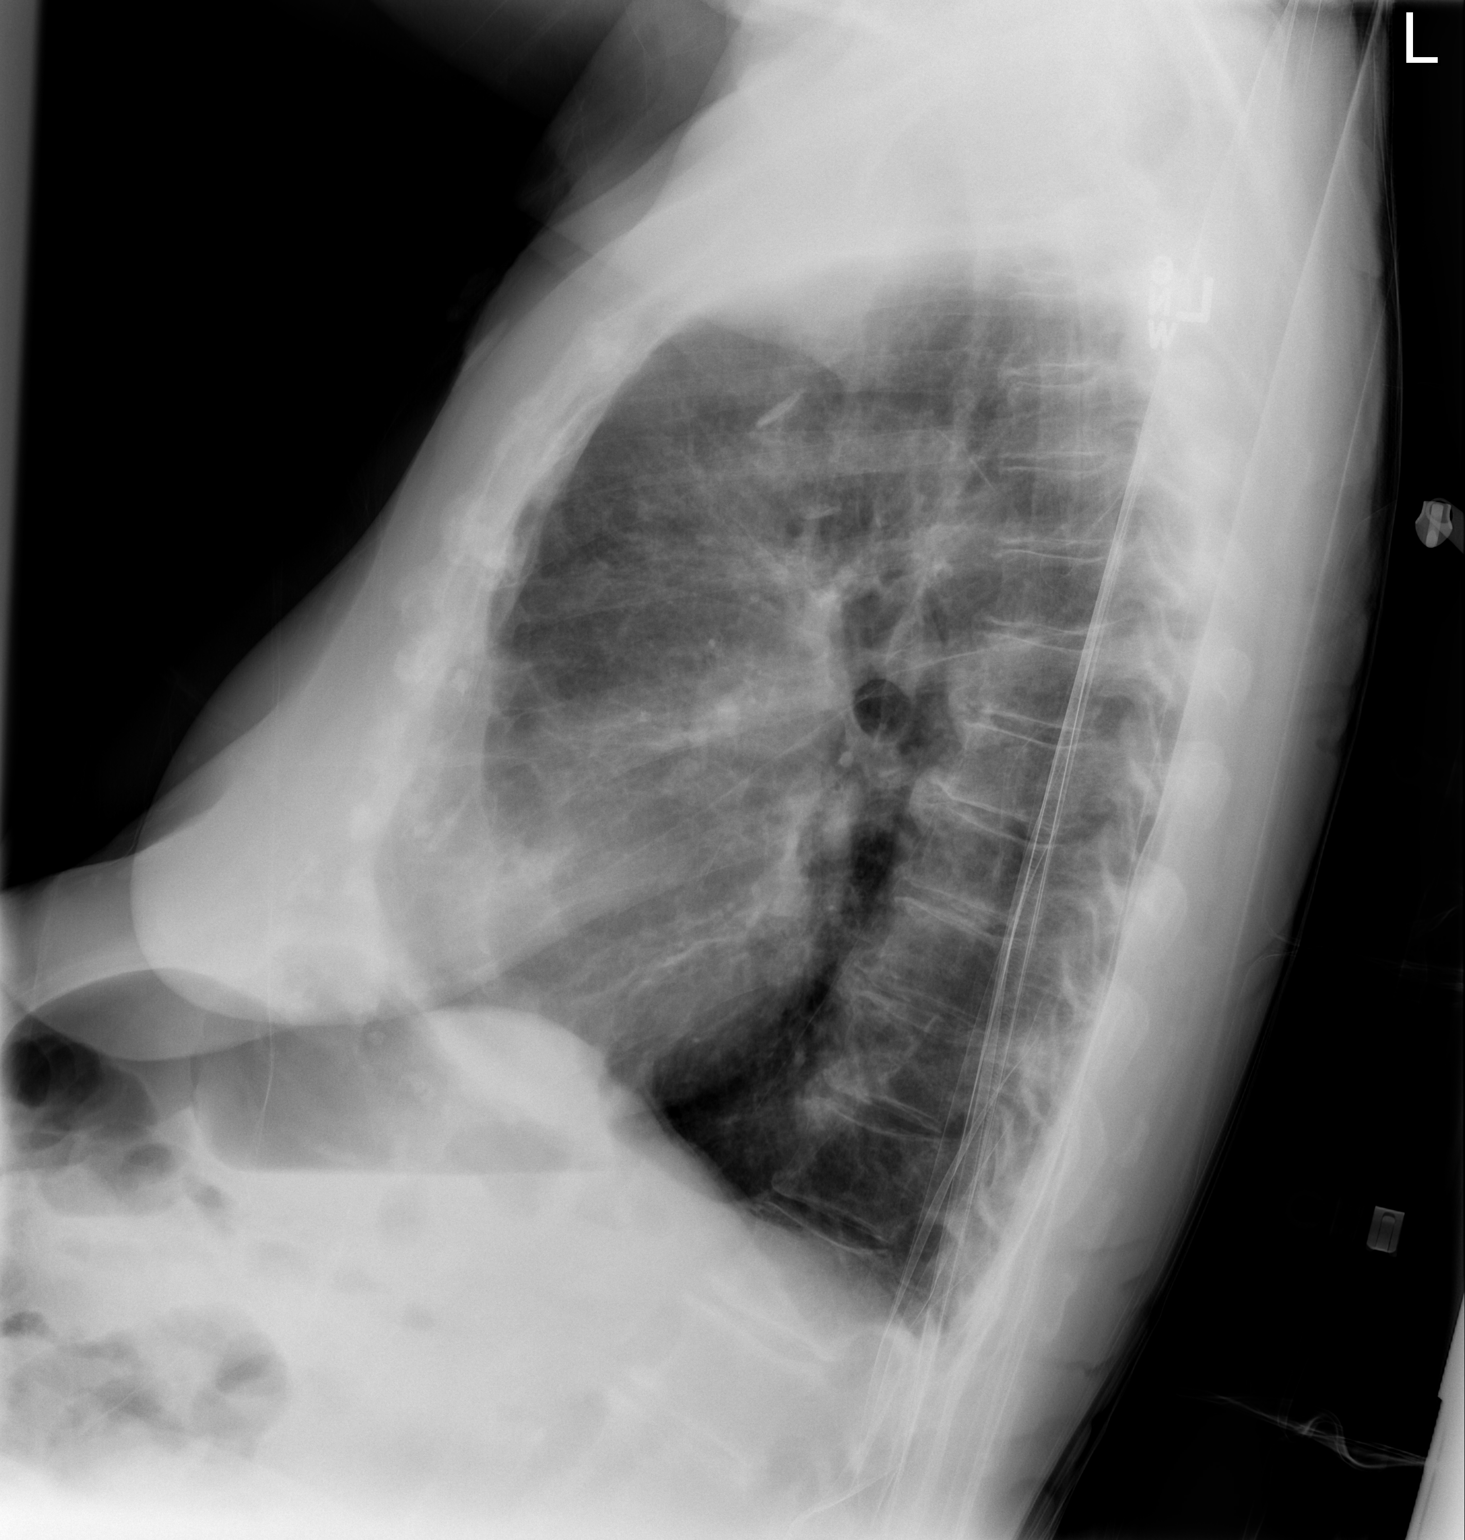

[1 of 1 positions shown; findings below may reference images not displayed]

FINDINGS: Stable cardiac and mediastinal contours with tortuosity of the
thoracic aorta. No consolidative pulmonary opacities. No pleural
effusion or pneumothorax. Regional skeleton is unremarkable.
IMPRESSION: No acute cardiopulmonary process.

## 2014-11-18 DIAGNOSIS — Z1231 Encounter for screening mammogram for malignant neoplasm of breast: Secondary | ICD-10-CM | POA: Diagnosis not present

## 2014-12-01 ENCOUNTER — Telehealth: Payer: Self-pay | Admitting: Internal Medicine

## 2014-12-01 DIAGNOSIS — J449 Chronic obstructive pulmonary disease, unspecified: Secondary | ICD-10-CM

## 2014-12-01 NOTE — Telephone Encounter (Signed)
Patient says that she needs new tubing, filter, mouthpiece sent to [place] for her nebulizer Order entered for supplies Nothing further needed.

## 2014-12-05 DIAGNOSIS — H43813 Vitreous degeneration, bilateral: Secondary | ICD-10-CM | POA: Diagnosis not present

## 2014-12-05 DIAGNOSIS — Z961 Presence of intraocular lens: Secondary | ICD-10-CM | POA: Diagnosis not present

## 2014-12-05 DIAGNOSIS — H04123 Dry eye syndrome of bilateral lacrimal glands: Secondary | ICD-10-CM | POA: Diagnosis not present

## 2014-12-05 DIAGNOSIS — D3131 Benign neoplasm of right choroid: Secondary | ICD-10-CM | POA: Diagnosis not present

## 2014-12-24 DIAGNOSIS — E871 Hypo-osmolality and hyponatremia: Secondary | ICD-10-CM | POA: Diagnosis not present

## 2014-12-24 DIAGNOSIS — J441 Chronic obstructive pulmonary disease with (acute) exacerbation: Secondary | ICD-10-CM | POA: Diagnosis not present

## 2014-12-24 DIAGNOSIS — M8580 Other specified disorders of bone density and structure, unspecified site: Secondary | ICD-10-CM | POA: Diagnosis not present

## 2014-12-24 DIAGNOSIS — E785 Hyperlipidemia, unspecified: Secondary | ICD-10-CM | POA: Diagnosis not present

## 2014-12-24 DIAGNOSIS — Z23 Encounter for immunization: Secondary | ICD-10-CM | POA: Diagnosis not present

## 2014-12-24 DIAGNOSIS — I10 Essential (primary) hypertension: Secondary | ICD-10-CM | POA: Diagnosis not present

## 2014-12-24 DIAGNOSIS — E559 Vitamin D deficiency, unspecified: Secondary | ICD-10-CM | POA: Diagnosis not present

## 2014-12-24 DIAGNOSIS — L03119 Cellulitis of unspecified part of limb: Secondary | ICD-10-CM | POA: Diagnosis not present

## 2015-04-17 ENCOUNTER — Ambulatory Visit (INDEPENDENT_AMBULATORY_CARE_PROVIDER_SITE_OTHER)
Admission: RE | Admit: 2015-04-17 | Discharge: 2015-04-17 | Disposition: A | Discharge status: Home / Self Care | Payer: Medicare Other | Source: Ambulatory Visit | Attending: Internal Medicine | Admitting: Internal Medicine

## 2015-04-17 ENCOUNTER — Encounter: Payer: Self-pay | Admitting: Internal Medicine

## 2015-04-17 ENCOUNTER — Ambulatory Visit (INDEPENDENT_AMBULATORY_CARE_PROVIDER_SITE_OTHER): Payer: Medicare Other | Admitting: Internal Medicine

## 2015-04-17 VITALS — BP 148/72 | HR 93 | Ht 60.0 in | Wt 125.6 lb

## 2015-04-17 DIAGNOSIS — L853 Xerosis cutis: Secondary | ICD-10-CM | POA: Diagnosis not present

## 2015-04-17 DIAGNOSIS — J449 Chronic obstructive pulmonary disease, unspecified: Secondary | ICD-10-CM

## 2015-04-17 MED ORDER — FLUTICASONE FUROATE-VILANTEROL 100-25 MCG/INH IN AEPB: 1.0000 | INHALATION_SPRAY | Freq: Every day | RESPIRATORY_TRACT | Status: DC

## 2015-04-17 NOTE — Patient Instructions (Addendum)
ICD-9-CM ICD-10-CM   1. COPD, severe ([place]) 496 J44.9   2. Dry skin 701.1 L85.3     Possible winter or general disease worsening of copd Dry skin - due to winter  Plan  - cxr 2 view 04/17/2015 - change advair to BREO 1 puff daily at night  - continue spiriva 1 puff daily at morning - albuterol as needed - avoid prednisone at your rquest - flaxseed daily -= apply vaseline jelly or eucerin cream twice daily to sking  followup Will call wiuth cxr results  pft in 3 months 3 months or sooner if needed

## 2015-04-17 NOTE — Progress Notes (Signed)
Subjective:     Patient ID: Sierra Warner, female   DOB: 04-27-1928, 82 y.o.   MRN: [id number]  HPI   OV 02/26/2013   Chief Complaint  Patient presents with  . Pulmonary Consult    switch from MW to MR. Pt c/o having SOB with activity, occasional dry cough, wheezing and chest tightness that is worse with colder weather.   . Medication Problem    pt states Spiriva is expensive, asking for samples.    Last seen by Dr. [name] to [name] 01/14/2013. This is a followup for the same but is also switching providers to Dr. [name] which is myself today she reports an overall COPD stable although this winter 2014/2015 it appears the cold weather is bothering her more and she is more dyspneic than her baseline dyspnea. Dyspnea is exertional and relieved by rest. Distance in effort tolerance not clear. Cough is very minimal. She clearly indicates that she's not in a COPD exacerbation situation without worsening of cough of sputum production.  She gives history of 2-3 COPD exacerbation severe 2014 treated either pulmonary office with antibiotic and prednisone all of her primary care physician. She denies having had pulmonary rehabilitation. She's not had alpha-1 testing.  She wantss improve her quality of life. Currently she is being maintained on nocturnal oxygen which she does not use and along with Spiriva once daily and perform his in budesonide nebulizers with which he is compliant. These medications help her but she finds Spiriva difficult effort especially in the donut hole she wants samples. She's not sure should be interested in research trials     Pulmonary function test 05/02/2011 - [name]. FEV1 0.8 L/51% which is 18%  BD  response. FVC is 1.9/82%. Ratio 42.  DLCO 8.6/57%  -  reports that she quit smoking about 5 years ago. Her smoking use included Cigarettes. She has a 13.5 pack-year smoking history. She has never used smokeless tobacco. - CXR 01/12/12 -  emphysemna, RML scarring, THoracic spondy   REC #COPD - currently clinically stable and not in flare up but we can do thinkgs to get you feeling better - continue spiriva once daily  - continue noctyuranl oxygen  - continue performist and budesonide nebs as before - test alpha 1 genetic test for copd today - [name] will refer you to pulm rehab on basis of moderate copd - think about research trials we discussed - glad you are uptodate with flu shot  - have PREVNAR pneumonia vaccine 02/26/2013 based on fact last pneumonia vaccine (was > 1 year to 20 years ago)   #Followup - do spirometry with [name] in 2 months - 2 months  After doing spirometry   OV 06/03/2013  Chief Complaint  Patient presents with  . COPD    follow-up. Pt states she has been having increased productive cough with clear phlegm, increased SOB and chest tightness and wheezing x 2 weeks.     Followup for COPD. 2 years ago she was [name] but do pulmonary function test shows that she slipped and to [name] despite quitting smoking. Details are below. However, now that spring is here she feels that her allergies have kicked up. Historically that allergy season has been the worst for her. Last 2 weeks she's had increasing cough and mucus production but she does not feel that she is in a COPD flareup. No worsening wheeze or dyspnea. She not interested  in antibiotics or steroids. In addition, she feels that her nebulizer bronchodilator and steroids but extremely inconvenient to her lifestyle and actually makes her cough paradoxically. She loves her Spiriva and Combivent when necessary. She wants change of her nebulizers to an inhaler. She plans to start rehab in summer 2015  In terms of allergy workup: She does not want to be seen by an allergist for the time being  Pulmonary function test 06/03/2013 shows FEV1 0.66 L/42%. FVC 1.48 L or 69%. Ratio 45. DLCO of 8.2/40%. Findings are consistent with  [name]. Above numbers reflect post bronchodilator response which was 0%. In addition FEV1 also represent 140 cc loss of lung function in just under 2 years which is beyond age specific loss. This is despite quitting cigarette smoking 6 years ago  Alpha 1 levesl normla dec 2014 but no phenotyping done   reports that she quit smoking about 6 years ago. Her smoking use included Cigarettes. She has a 13.5 pack-year smoking history. She has never used smokeless tobacco.  #COPD  - progressive lung function loss from feb 2013 through March 2015 but not in flare up - agree that nebulizer is inconvenient for you - change nebs to advair 100/50 1 puff twice daily - continue spiriva    #Allergies  - continue your zyrtec - if this is aproblem, in future I can refer you to allergy specialist  #FOllowup 4 months or sooner if    OV 10/01/2013  Chief Complaint  Patient presents with  . Follow-up    Pt states her breathing has worsened d/t hot weather. C/o increase SOB d/t weather. Denies cough and CP/tightness.    Followup [name]  - She is struggling to 5 inhaler regimen of Spiriva and Advair. Therefore she is has stopped Spiriva and is taking Advair only once a day. She is supplementing this by using her nebulizers at night. She's not happy with this regimen because of cost. At the same time because of this, his symptoms are slightly worse than baseline but she denies she is in flare up. She is also not compliant with using oxygen at night due to inconvenience reasons. At the same time she does not want to give up on the oxygen. She gives a somewhat he does making her more symptomatic with shortness of breath. I have agreed to help her with samples through the summer and then reassess in the fall as to what will be the best balance of cost and effective inhaler regimen     11/26/2013 Follow Up COPD  Returns for COPD follow up .  Does Advair 1 puff in am, Perforomist At  bedtime   Does Spiriva every other day. Seems to do very well for her Wants to avoid Doughnut hole>meds are very expensive.  Overall feels she is doing okay .  No flare in cough, wheezing or shortness of breath No fever, chest pain edema or hemoptysis.  On O2 at bedtime.  >flu shot   12/27/2013 Follow up  Complains over last 2 weeks of increased cough, congestion, wheezing and tightness.  Was seen by PCP last week with rx for Levaquin and Prednisone taper. Starting to feel better, cough and wheezing are less. Has finished her steroid and abx.   Also seen in ER on 10/2  with visual changes , underwent CT head that was neg. CXR w/ no acute process.  Feels she may have gotten some neb med mist  in her eye .  Right sided blurred vision resolved.  Denies f/c/s, n/v/d, hemoptysis, PND, leg swelling.     OV 02/17/2014  Chief Complaint  Patient presents with  . Follow-up    Pt states her breathing is unchanged since last OV with TP. Pt c/o DOE. Pt denies cough and CP/tightness.    Follow-up Gold stage 3I COPD with reversibility ex-smoker   Overall COPD stable without any exacerbations since last visit with nurse practitioner 12/27/2013. She is now in the donut hole and wants samples. She is up-to-date with her vaccines. Her main issue is fatigue at the end of the day and particularly with exertion. She has never attended pulmonary rehabilitation even though I have counseled her to do so. She cites different logistical reasons even though she is retired. I discussed some recent studies within she's not interested   Past medical history reviewed: No issues no changes    08/08/14 Follow up : COPD  Pt returns for follow up .  Overall doing well with no flare of cough , dyspnea or wheeizng .  On Advair in am and budesonide /performist in pm Takes Spiriva every other day.  She uses this regimen to keep costs down.  Does get winded at time with activity.  CXR 12/2013 w/ no acute process.   PVX and Prevnar are utd .  No chest pain ,orthopnea, edema or fever.    OV 10/13/2014  Chief Complaint  Patient presents with  . Follow-up    Pt states her breathing has worsened and she thinks its due to the weather. Pt c/o denies cough and CP/tightness.     Follow-up [name]  She continues on her personal cocktail regimen of Spiriva alternating with long-acting beta agonist/inhale corticosteroid-induced other day. The long-acting beta agonist inhaled cortical steroidal fluctuates between Advair in the morning and nebulizer in the evening. Overall COPD stable but with the recent heat and humidity she's having worsening shortness of breath but no change in cough or sputum production or wheezing or edema or chest pain. She does not want steroidal's.     OV 04/17/2015  Chief Complaint  Patient presents with  . Follow-up    Pt states she feels her breathing has worsened since last OV - pt states she thinks it is due to the cold weather and dry air. Pt c/o coughing after neb meds.    Follow-up [name]  She continues her personal cocktail regimen of Spiriva once daily in the morning and Advair at night. She is not using the nebulizers because it makes her cough and shortness of breath worse. She is feeling the effect of the winter in 2 ways. The dry heat in her house makes her skin itch he did she's complaining of dry skin it is at least moderate in intensity. It is new. In addition since Christmas 2060 and she is having progressive worsening of cough and shortness of breath and chest tightness. Although on the COPD cat score the symptoms go to same and stable. She does not want to try prednisone burst. She does not feel she is in COPD exacerbation. She feels this is general worsening. She is willing to try another inhaler.    CAT COPD Symptom & Quality of Life Score (GSK trademark) 0 is no burden. 5 is highest burden 02/26/2013  04/17/2015   Never Cough -> Cough  all the time 3 3  No phlegm in chest -> Chest is full of phlegm 3 3  No  chest tightness -> Chest feels very tight 3 3  No dyspnea for 1 flight stairs/hill -> Very dyspneic for 1 flight of stairs 3 4  No limitations for ADL at home -> Very limited with ADL at home 2 2  Confident leaving home -> Not at all confident leaving home 0 1  Sleep soundly -> Do not sleep soundly because of lung condition 3 2  Lots of Energy -> No energy at all 3 3  TOTAL Score (max 40)  20 21   has a past medical history of Osteopenia; Atrophic vaginitis; Hemorrhoids; COPD, mild ([place]); and Microscopic hematuria.   reports that she quit smoking about 8 years ago. Her smoking use included Cigarettes. She has a 13.5 pack-year smoking history. She has never used smokeless tobacco.  Past Surgical History  Procedure Laterality Date  . Appendectomy  1933  . Thyroidectomy, partial    . Cataract surgery    . Tonsillectomy    . Cystoscopy      Allergies  Allergen Reactions  . Sulfa Antibiotics Hives and Rash    Immunization History  Administered Date(s) Administered  . Influenza Split 12/14/2011, 12/18/2012  . Influenza Whole 12/20/2010  . Influenza,inj,Quad PF,36+ Mos 11/26/2013, 12/20/2014  . Pneumococcal Conjugate-13 02/26/2013  . Pneumococcal Polysaccharide-23 10/13/2014    Family History  Problem Relation Age of Onset  . Diabetes Mother   . Hypertension Mother   . Heart disease Father   . Hypertension Father   . Heart failure Father      Current outpatient prescriptions:  .  albuterol-ipratropium (COMBIVENT) 18-103 MCG/ACT inhaler, Inhale 1 puff into the lungs every 6 (six) hours as needed. , Disp: , Rfl:  .  budesonide (PULMICORT) 0.25 MG/2ML nebulizer solution, USE ONE VIAL VIA NEBULIZER 2 TIMES DAILY (Patient taking differently: USE ONE VIAL VIA NEBULIZER 1 time a day), Disp: 60 mL, Rfl: 0 .  Calcium Carbonate-Vitamin D (CALCIUM + D PO), Take by mouth 2 (two) times daily.  , Disp: , Rfl:  .   cholecalciferol (VITAMIN D) 1000 UNITS tablet, Take 1,000 Units by mouth daily., Disp: , Rfl:  .  diphenhydrAMINE (BENADRYL) 25 MG tablet, Take 25 mg by mouth every 6 (six) hours as needed for allergies., Disp: , Rfl:  .  Fluticasone-Salmeterol (ADVAIR DISKUS) 100-50 MCG/DOSE AEPB, Inhale 1 puff into the lungs 2 (two) times daily., Disp: 60 each, Rfl: 3 .  losartan-hydrochlorothiazide (HYZAAR) 100-12.5 MG per tablet, , Disp: , Rfl: 1 .  Multiple Vitamins-Minerals (MULTIVITAMIN GUMMIES ADULT PO), Take 1 tablet by mouth daily. , Disp: , Rfl:  .  PERFOROMIST 20 MCG/2ML nebulizer solution, USE ONE VIAL VIA NEBULIZER 2 TIMES DAILY (Patient taking differently: USE ONE VIAL VIA NEBULIZER 1 time a day), Disp: 120 mL, Rfl: 0 .  Polyethylene Glycol 3350 (MIRALAX PO), Take 1 packet by mouth daily. , Disp: , Rfl:  .  Tiotropium Bromide Monohydrate (SPIRIVA RESPIMAT) 2.5 MCG/ACT AERS, Inhale 1 puff into the lungs daily., Disp: 1 Inhaler, Rfl: 3     Review of Systems     Objective:   Physical Exam  Filed Vitals:   04/17/15 1443  BP: 148/72  Pulse: 93  Height: 5' (1.524 m)  Weight: 125 lb 9.6 oz (56.972 kg)  SpO2: 97%        Assessment:       ICD-9-CM ICD-10-CM   1. COPD, severe ([place]) 496 J44.9   2. Dry skin 701.1 L85.3        Plan:  Possible winter or general disease worsening of copd Dry skin - due to winter  Plan  - cxr 2 view 04/17/2015 - change advair to BREO 1 puff daily at night  - continue spiriva 1 puff daily at morning - albuterol as needed - avoid prednisone at your rquest - flaxseed daily -= apply vaseline jelly or eucerin cream twice daily to sking  followup Will call wiuth cxr results  pft in 3 months 3 months or sooner if needed      Dr. Brand Males, M.D., [facility].C.P Pulmonary and Critical Care Medicine Staff Physician [facility] Pager: [phone], If no answer or between  15:00h - 7:00h: call [phone]  04/17/2015 3:13 PM

## 2015-05-01 ENCOUNTER — Telehealth: Payer: Self-pay | Admitting: Internal Medicine

## 2015-05-01 NOTE — Telephone Encounter (Signed)
Per 04/17/15 OV; Plan  - cxr 2 view 04/17/2015 - change advair to BREO 1 puff daily at night   - continue spiriva 1 puff daily at morning - albuterol as needed - avoid prednisone at your rquest - flaxseed daily -= apply vaseline jelly or eucerin cream twice daily to sking  followup Will call wiuth cxr results   pft in 3 months 3 months or sooner if needed --- Called spoke with pt. She reports she can't tell yet if the breo helped or not. Wants another sample. i have left this for pick up. Nothing further needed

## 2015-05-25 DIAGNOSIS — J309 Allergic rhinitis, unspecified: Secondary | ICD-10-CM | POA: Diagnosis not present

## 2015-06-08 DIAGNOSIS — J329 Chronic sinusitis, unspecified: Secondary | ICD-10-CM | POA: Diagnosis not present

## 2015-06-24 DIAGNOSIS — R0789 Other chest pain: Secondary | ICD-10-CM | POA: Diagnosis not present

## 2015-06-24 DIAGNOSIS — E785 Hyperlipidemia, unspecified: Secondary | ICD-10-CM | POA: Diagnosis not present

## 2015-06-24 DIAGNOSIS — M8580 Other specified disorders of bone density and structure, unspecified site: Secondary | ICD-10-CM | POA: Diagnosis not present

## 2015-06-24 DIAGNOSIS — J449 Chronic obstructive pulmonary disease, unspecified: Secondary | ICD-10-CM | POA: Diagnosis not present

## 2015-06-24 DIAGNOSIS — J309 Allergic rhinitis, unspecified: Secondary | ICD-10-CM | POA: Diagnosis not present

## 2015-06-24 DIAGNOSIS — E559 Vitamin D deficiency, unspecified: Secondary | ICD-10-CM | POA: Diagnosis not present

## 2015-06-24 DIAGNOSIS — Z79899 Other long term (current) drug therapy: Secondary | ICD-10-CM | POA: Diagnosis not present

## 2015-06-24 DIAGNOSIS — I10 Essential (primary) hypertension: Secondary | ICD-10-CM | POA: Diagnosis not present

## 2015-06-26 DIAGNOSIS — E559 Vitamin D deficiency, unspecified: Secondary | ICD-10-CM | POA: Diagnosis not present

## 2015-06-26 DIAGNOSIS — I10 Essential (primary) hypertension: Secondary | ICD-10-CM | POA: Diagnosis not present

## 2015-06-26 DIAGNOSIS — R319 Hematuria, unspecified: Secondary | ICD-10-CM | POA: Diagnosis not present

## 2015-06-26 DIAGNOSIS — Z79899 Other long term (current) drug therapy: Secondary | ICD-10-CM | POA: Diagnosis not present

## 2015-06-26 DIAGNOSIS — J449 Chronic obstructive pulmonary disease, unspecified: Secondary | ICD-10-CM | POA: Diagnosis not present

## 2015-06-26 DIAGNOSIS — R0789 Other chest pain: Secondary | ICD-10-CM | POA: Diagnosis not present

## 2015-06-26 DIAGNOSIS — E785 Hyperlipidemia, unspecified: Secondary | ICD-10-CM | POA: Diagnosis not present

## 2015-06-26 DIAGNOSIS — R8299 Other abnormal findings in urine: Secondary | ICD-10-CM | POA: Diagnosis not present

## 2015-07-08 DIAGNOSIS — M8589 Other specified disorders of bone density and structure, multiple sites: Secondary | ICD-10-CM | POA: Diagnosis not present

## 2015-07-17 ENCOUNTER — Telehealth: Payer: Self-pay | Admitting: Internal Medicine

## 2015-07-17 MED ORDER — BUDESONIDE 0.25 MG/2ML IN SUSP: 0.2500 mg | Freq: Two times a day (BID) | RESPIRATORY_TRACT | Status: DC

## 2015-07-17 MED ORDER — FORMOTEROL FUMARATE 20 MCG/2ML IN NEBU: INHALATION_SOLUTION | RESPIRATORY_TRACT | Status: DC

## 2015-07-17 NOTE — Telephone Encounter (Signed)
Called spoke with Carly at [facility]. She states she needs a verbal order for the patient's budesonide and performist. I gave verbal order and documented in epic. She voiced understanding and had no further questions. Nothing further needed.

## 2015-07-17 NOTE — Telephone Encounter (Signed)
[phone] Sierra Warner calling from [place]

## 2015-07-17 NOTE — Telephone Encounter (Signed)
Attempted to call Stanton Kidney at [place], had to leave message for her to call back.

## 2015-07-20 ENCOUNTER — Ambulatory Visit: Payer: Self-pay | Admitting: Internal Medicine

## 2015-07-22 DIAGNOSIS — R0602 Shortness of breath: Secondary | ICD-10-CM | POA: Diagnosis not present

## 2015-07-22 DIAGNOSIS — I10 Essential (primary) hypertension: Secondary | ICD-10-CM | POA: Diagnosis not present

## 2015-07-22 DIAGNOSIS — J432 Centrilobular emphysema: Secondary | ICD-10-CM | POA: Diagnosis not present

## 2015-07-24 ENCOUNTER — Ambulatory Visit: Payer: Self-pay | Admitting: Internal Medicine

## 2015-07-24 ENCOUNTER — Telehealth: Payer: Self-pay | Admitting: Internal Medicine

## 2015-07-24 NOTE — Telephone Encounter (Signed)
Called, spoke with pt.  Pt states she has an appt already scheduled with MR and would like to been seen sooner if possible.  Pt ok with having PFT at hospital followed by OV with MR.  Appt scheduled for June 9 at 2 pm for PFT at [facility] followed by appt with MR at 3:15.  Pt confirmed and is aware previous appts cancelled.  Pt states she's going to discuss past visit with cardiology with MR, but this can wait to discuss during appt.  Pt states nothing further needed at this time.  Will sign off.

## 2015-08-04 DIAGNOSIS — I10 Essential (primary) hypertension: Secondary | ICD-10-CM | POA: Diagnosis not present

## 2015-08-04 DIAGNOSIS — J449 Chronic obstructive pulmonary disease, unspecified: Secondary | ICD-10-CM | POA: Diagnosis not present

## 2015-08-04 DIAGNOSIS — R0602 Shortness of breath: Secondary | ICD-10-CM | POA: Diagnosis not present

## 2015-08-21 DIAGNOSIS — R0602 Shortness of breath: Secondary | ICD-10-CM | POA: Diagnosis not present

## 2015-08-21 DIAGNOSIS — J432 Centrilobular emphysema: Secondary | ICD-10-CM | POA: Diagnosis not present

## 2015-08-21 DIAGNOSIS — I10 Essential (primary) hypertension: Secondary | ICD-10-CM | POA: Diagnosis not present

## 2015-08-28 ENCOUNTER — Ambulatory Visit: Payer: Self-pay | Admitting: Internal Medicine

## 2015-08-28 ENCOUNTER — Ambulatory Visit (HOSPITAL_COMMUNITY)
Admission: RE | Admit: 2015-08-28 | Discharge: 2015-08-28 | Disposition: A | Discharge status: Home / Self Care | Payer: Medicare Other | Source: Ambulatory Visit | Attending: Internal Medicine | Admitting: Internal Medicine

## 2015-08-28 DIAGNOSIS — J449 Chronic obstructive pulmonary disease, unspecified: Secondary | ICD-10-CM | POA: Diagnosis not present

## 2015-08-28 LAB — PULMONARY FUNCTION TEST
DL/VA % PRED: 66 %
DL/VA: 2.91 ml/min/mmHg/L
DLCO unc % pred: 43 %
DLCO unc: 8.71 ml/min/mmHg
FEF 25-75 POST: 0.19 L/s
FEF 25-75 Pre: 0.23 L/sec
FEF2575-%CHANGE-POST: -17 %
FEF2575-%PRED-POST: 19 %
FEF2575-%PRED-PRE: 23 %
FEV1-%CHANGE-POST: -5 %
FEV1-%PRED-PRE: 50 %
FEV1-%Pred-Post: 48 %
FEV1-PRE: 0.75 L
FEV1-Post: 0.72 L
FEV1FVC-%CHANGE-POST: 0 %
FEV1FVC-%PRED-PRE: 50 %
FEV6-%Change-Post: -6 %
FEV6-%Pred-Post: 82 %
FEV6-%Pred-Pre: 88 %
FEV6-POST: 1.56 L
FEV6-Pre: 1.67 L
FEV6FVC-%Change-Post: -1 %
FEV6FVC-%PRED-POST: 85 %
FEV6FVC-%Pred-Pre: 86 %
FVC-%CHANGE-POST: -5 %
FVC-%PRED-POST: 96 %
FVC-%PRED-PRE: 101 %
FVC-POST: 1.95 L
FVC-PRE: 2.06 L
PRE FEV1/FVC RATIO: 37 %
PRE FEV6/FVC RATIO: 81 %
Post FEV1/FVC ratio: 37 %
Post FEV6/FVC ratio: 80 %
RV % pred: 119 %
RV: 2.79 L
TLC % PRED: 105 %
TLC: 4.87 L

## 2015-08-28 MED ORDER — ALBUTEROL SULFATE (2.5 MG/3ML) 0.083% IN NEBU
2.5000 mg | INHALATION_SOLUTION | Freq: Once | RESPIRATORY_TRACT | Status: AC
  Administered 2015-08-28: 2.5 mg via RESPIRATORY_TRACT

## 2015-09-02 ENCOUNTER — Other Ambulatory Visit: Payer: Self-pay | Admitting: Family Medicine

## 2015-09-02 DIAGNOSIS — M8580 Other specified disorders of bone density and structure, unspecified site: Secondary | ICD-10-CM

## 2015-09-21 ENCOUNTER — Encounter: Payer: Self-pay | Admitting: Internal Medicine

## 2015-09-21 ENCOUNTER — Ambulatory Visit (INDEPENDENT_AMBULATORY_CARE_PROVIDER_SITE_OTHER): Payer: Medicare Other | Admitting: Internal Medicine

## 2015-09-21 VITALS — BP 138/70 | HR 84 | Ht 61.0 in | Wt 126.0 lb

## 2015-09-21 DIAGNOSIS — J449 Chronic obstructive pulmonary disease, unspecified: Secondary | ICD-10-CM | POA: Diagnosis not present

## 2015-09-21 NOTE — Patient Instructions (Signed)
ICD-9-CM ICD-10-CM   1. COPD, severe ([place]) 496 J44.9    PFT stable in 2 years Symptom worsening due to heat and difficulty affording inhaler and physical deconditioning and possibly exertional hypoxemia  Plan Continue spiriva in morning and advair at night as before with albuterol as needed  - take sample Continue o2 at night Walk test in room air 09/21/2015 and if desaturates -start exertional o2 at home Refer pulmonary rehab  Flu shot in fall  Folllowup 4-6 months or sooner if needed

## 2015-09-21 NOTE — Addendum Note (Signed)
Addended by: Maryanna Shape A on: 09/21/2015 03:21 PM   Modules accepted: Orders

## 2015-09-21 NOTE — Progress Notes (Signed)
Subjective:     Patient ID: Sierra Warner, female   DOB: 01/23/1929, 82 y.o.   MRN: [id number]  HPI    OV 02/26/2013   Chief Complaint  Patient presents with  . Pulmonary Consult    switch from MW to MR. Pt c/o having SOB with activity, occasional dry cough, wheezing and chest tightness that is worse with colder weather.   . Medication Problem    pt states Spiriva is expensive, asking for samples.    Last seen by Dr. [name] to [name] 01/14/2013. This is a followup for the same but is also switching providers to Dr. [name] which is myself today she reports an overall COPD stable although this winter 2014/2015 it appears the cold weather is bothering her more and she is more dyspneic than her baseline dyspnea. Dyspnea is exertional and relieved by rest. Distance in effort tolerance not clear. Cough is very minimal. She clearly indicates that she's not in a COPD exacerbation situation without worsening of cough of sputum production.  She gives history of 2-3 COPD exacerbation severe 2014 treated either pulmonary office with antibiotic and prednisone all of her primary care physician. She denies having had pulmonary rehabilitation. She's not had alpha-1 testing.  She wantss improve her quality of life. Currently she is being maintained on nocturnal oxygen which she does not use and along with Spiriva once daily and perform his in budesonide nebulizers with which he is compliant. These medications help her but she finds Spiriva difficult effort especially in the donut hole she wants samples. She's not sure should be interested in research trials     Pulmonary function test 05/02/2011 - [name]. FEV1 0.8 L/51% which is 18%  BD  response. FVC is 1.9/82%. Ratio 42.  DLCO 8.6/57%  -  reports that she quit smoking about 5 years ago. Her smoking use included Cigarettes. She has a 13.5 pack-year smoking history. She has never used smokeless tobacco. - CXR 01/12/12 -  emphysemna, RML scarring, THoracic spondy   REC #COPD - currently clinically stable and not in flare up but we can do thinkgs to get you feeling better - continue spiriva once daily  - continue noctyuranl oxygen  - continue performist and budesonide nebs as before - test alpha 1 genetic test for copd today - [name] will refer you to pulm rehab on basis of moderate copd - think about research trials we discussed - glad you are uptodate with flu shot  - have PREVNAR pneumonia vaccine 02/26/2013 based on fact last pneumonia vaccine (was > 1 year to 20 years ago)   #Followup - do spirometry with [name] in 2 months - 2 months  After doing spirometry   OV 06/03/2013  Chief Complaint  Patient presents with  . COPD    follow-up. Pt states she has been having increased productive cough with clear phlegm, increased SOB and chest tightness and wheezing x 2 weeks.     Followup for COPD. 2 years ago she was [name] but do pulmonary function test shows that she slipped and to [name] despite quitting smoking. Details are below. However, now that spring is here she feels that her allergies have kicked up. Historically that allergy season has been the worst for her. Last 2 weeks she's had increasing cough and mucus production but she does not feel that she is in a COPD flareup. No worsening wheeze or dyspnea. She not  interested in antibiotics or steroids. In addition, she feels that her nebulizer bronchodilator and steroids but extremely inconvenient to her lifestyle and actually makes her cough paradoxically. She loves her Spiriva and Combivent when necessary. She wants change of her nebulizers to an inhaler. She plans to start rehab in summer 2015  In terms of allergy workup: She does not want to be seen by an allergist for the time being  Pulmonary function test 06/03/2013 shows FEV1 0.66 L/42%. FVC 1.48 L or 69%. Ratio 45. DLCO of 8.2/40%. Findings are consistent with  [name]. Above numbers reflect post bronchodilator response which was 0%. In addition FEV1 also represent 140 cc loss of lung function in just under 2 years which is beyond age specific loss. This is despite quitting cigarette smoking 6 years ago  Alpha 1 levesl normla dec 2014 but no phenotyping done   reports that she quit smoking about 6 years ago. Her smoking use included Cigarettes. She has a 13.5 pack-year smoking history. She has never used smokeless tobacco.  #COPD  - progressive lung function loss from feb 2013 through March 2015 but not in flare up - agree that nebulizer is inconvenient for you - change nebs to advair 100/50 1 puff twice daily - continue spiriva    #Allergies  - continue your zyrtec - if this is aproblem, in future I can refer you to allergy specialist  #FOllowup 4 months or sooner if    OV 10/01/2013  Chief Complaint  Patient presents with  . Follow-up    Pt states her breathing has worsened d/t hot weather. C/o increase SOB d/t weather. Denies cough and CP/tightness.    Followup [name]  - She is struggling to 5 inhaler regimen of Spiriva and Advair. Therefore she is has stopped Spiriva and is taking Advair only once a day. She is supplementing this by using her nebulizers at night. She's not happy with this regimen because of cost. At the same time because of this, his symptoms are slightly worse than baseline but she denies she is in flare up. She is also not compliant with using oxygen at night due to inconvenience reasons. At the same time she does not want to give up on the oxygen. She gives a somewhat he does making her more symptomatic with shortness of breath. I have agreed to help her with samples through the summer and then reassess in the fall as to what will be the best balance of cost and effective inhaler regimen     11/26/2013 Follow Up COPD  Returns for COPD follow up .  Does Advair 1 puff in am, Perforomist At  bedtime   Does Spiriva every other day. Seems to do very well for her Wants to avoid Doughnut hole>meds are very expensive.  Overall feels she is doing okay .  No flare in cough, wheezing or shortness of breath No fever, chest pain edema or hemoptysis.  On O2 at bedtime.  >flu shot   12/27/2013 Follow up  Complains over last 2 weeks of increased cough, congestion, wheezing and tightness.  Was seen by PCP last week with rx for Levaquin and Prednisone taper. Starting to feel better, cough and wheezing are less. Has finished her steroid and abx.   Also seen in ER on 10/2  with visual changes , underwent CT head that was neg. CXR w/ no acute process.  Feels she may have gotten some neb med mist  in her  eye .  Right sided blurred vision resolved.  Denies f/c/s, n/v/d, hemoptysis, PND, leg swelling.     OV 02/17/2014  Chief Complaint  Patient presents with  . Follow-up    Pt states her breathing is unchanged since last OV with TP. Pt c/o DOE. Pt denies cough and CP/tightness.    Follow-up Gold stage 3I COPD with reversibility ex-smoker   Overall COPD stable without any exacerbations since last visit with nurse practitioner 12/27/2013. She is now in the donut hole and wants samples. She is up-to-date with her vaccines. Her main issue is fatigue at the end of the day and particularly with exertion. She has never attended pulmonary rehabilitation even though I have counseled her to do so. She cites different logistical reasons even though she is retired. I discussed some recent studies within she's not interested   Past medical history reviewed: No issues no changes    08/08/14 Follow up : COPD  Pt returns for follow up .  Overall doing well with no flare of cough , dyspnea or wheeizng .  On Advair in am and budesonide /performist in pm Takes Spiriva every other day.  She uses this regimen to keep costs down.  Does get winded at time with activity.  CXR 12/2013 w/ no acute process.   PVX and Prevnar are utd .  No chest pain ,orthopnea, edema or fever.    OV 10/13/2014  Chief Complaint  Patient presents with  . Follow-up    Pt states her breathing has worsened and she thinks its due to the weather. Pt c/o denies cough and CP/tightness.     Follow-up [name]  She continues on her personal cocktail regimen of Spiriva alternating with long-acting beta agonist/inhale corticosteroid-induced other day. The long-acting beta agonist inhaled cortical steroidal fluctuates between Advair in the morning and nebulizer in the evening. Overall COPD stable but with the recent heat and humidity she's having worsening shortness of breath but no change in cough or sputum production or wheezing or edema or chest pain. She does not want steroidal's.     OV 04/17/2015  Chief Complaint  Patient presents with  . Follow-up    Pt states she feels her breathing has worsened since last OV - pt states she thinks it is due to the cold weather and dry air. Pt c/o coughing after neb meds.    Follow-up [name]  She continues her personal cocktail regimen of Spiriva once daily in the morning and Advair at night. She is not using the nebulizers because it makes her cough and shortness of breath worse. She is feeling the effect of the winter in 2 ways. The dry heat in her house makes her skin itch he did she's complaining of dry skin it is at least moderate in intensity. It is new. In addition since Christmas 2016 and she is having progressive worsening of cough and shortness of breath and chest tightness. Although on the COPD cat score the symptoms go to same and stable. She does not want to try prednisone burst. She does not feel she is in COPD exacerbation. She feels this is general worsening. She is willing to try another inhaler.    has a past medical history of Osteopenia; Atrophic vaginitis; Hemorrhoids; COPD, mild ([place]); and Microscopic hematuria.  OV 09/21/2015  Chief  Complaint  Patient presents with  . Follow-up    PFT results. pt states breathing has worsen w/ any exertion since last  OV feels it's due to the hot weather. c/o increased sob, occ prod cough w/white mucus, occ wheezing.   Follow-up [name]  She continues a personal cocktail regimen of Spiriva in the morning and Advair at night. She will not do nebulizers because it makes her symptoms worse. She is feeling the effect of the summer. Her symptoms are worse. Especially in the last 2 months. COPD cat score is 28. She does not want to do prednisone. She is also having some difficulty affording her inhalers for this is accounting for it aas well. In addition she's always declined to pulmonary rehabilitation and she is becoming more sedentary. This time she is more open to doing it. She does use oxygen at night but she's always been reluctant to use it with exertion but this time she says if she desaturates with exertion she will use it in the daytime. She had pulmonary function test 09/07/2015 this was reviewed and confirmed [name] with an FEV1 of 48% which is similar to 2 years ago without any decline. This no associated chest pain  Of note last time I gave her Brio but she could not afford it.        CAT COPD Symptom & Quality of Life Score (GSK trademark) 0 is no burden. 5 is highest burden 02/26/2013  04/17/2015  09/21/2015   Never Cough -> Cough all the time 3 3 3   No phlegm in chest -> Chest is full of phlegm 3 3 3   No chest tightness -> Chest feels very tight 3 3 3   No dyspnea for 1 flight stairs/hill -> Very dyspneic for 1 flight of stairs 3 4 5   No limitations for ADL at home -> Very limited with ADL at home 2 2 4   Confident leaving home -> Not at all confident leaving home 0 1 2  Sleep soundly -> Do not sleep soundly because of lung condition 3 2 3   Lots of Energy -> No energy at all 3 3 5   TOTAL Score (max 40)  20 21 28       has a past medical history of  Osteopenia; Atrophic vaginitis; Hemorrhoids; COPD, mild ([place]); and Microscopic hematuria.   reports that she quit smoking about 8 years ago. Her smoking use included Cigarettes. She has a 13.5 pack-year smoking history. She has never used smokeless tobacco.  Past Surgical History  Procedure Laterality Date  . Appendectomy  1933  . Thyroidectomy, partial    . Cataract surgery    . Tonsillectomy    . Cystoscopy      Allergies  Allergen Reactions  . Sulfa Antibiotics Hives and Rash    Immunization History  Administered Date(s) Administered  . Influenza Split 12/14/2011, 12/18/2012  . Influenza Whole 12/20/2010  . Influenza,inj,Quad PF,36+ Mos 11/26/2013, 12/20/2014  . Pneumococcal Conjugate-13 02/26/2013  . Pneumococcal Polysaccharide-23 10/13/2014    Family History  Problem Relation Age of Onset  . Diabetes Mother   . Hypertension Mother   . Heart disease Father   . Hypertension Father   . Heart failure Father      Current outpatient prescriptions:  .  albuterol-ipratropium (COMBIVENT) 18-103 MCG/ACT inhaler, Inhale 1 puff into the lungs every 6 (six) hours as needed. , Disp: , Rfl:  .  Calcium Carbonate-Vitamin D (CALCIUM + D PO), Take by mouth 2 (two) times daily.  , Disp: , Rfl:  .  cholecalciferol (VITAMIN D) 1000 UNITS tablet, Take 1,000 Units by mouth  daily., Disp: , Rfl:  .  Fluticasone-Salmeterol (ADVAIR DISKUS) 100-50 MCG/DOSE AEPB, Inhale 1 puff into the lungs 2 (two) times daily., Disp: 60 each, Rfl: 3 .  formoterol (PERFOROMIST) 20 MCG/2ML nebulizer solution, USE ONE VIAL VIA NEBULIZER 2 TIMES DAILY, Disp: 120 mL, Rfl: 3 .  losartan-hydrochlorothiazide (HYZAAR) 100-12.5 MG per tablet, , Disp: , Rfl: 1 .  Multiple Vitamins-Minerals (MULTIVITAMIN GUMMIES ADULT PO), Take 1 tablet by mouth daily. , Disp: , Rfl:  .  Polyethylene Glycol 3350 (MIRALAX PO), Take 1 packet by mouth daily. , Disp: , Rfl:  .  Tiotropium Bromide Monohydrate (SPIRIVA RESPIMAT) 2.5 MCG/ACT  AERS, Inhale 1 puff into the lungs daily., Disp: 1 Inhaler, Rfl: 3 .  budesonide (PULMICORT) 0.25 MG/2ML nebulizer solution, Take 2 mLs (0.25 mg total) by nebulization 2 (two) times daily. (Patient not taking: Reported on 09/21/2015), Disp: 120 mL, Rfl: 3 .  Fluticasone Furoate-Vilanterol (BREO ELLIPTA) 100-25 MCG/INH AEPB, Inhale 1 puff into the lungs daily. (Patient not taking: Reported on 09/21/2015), Disp: 14 each, Rfl: 0     Review of Systems     Objective:   Physical Exam  Constitutional: She is oriented to person, place, and time. She appears well-developed and well-nourished. No distress.  HENT:  Head: Normocephalic and atraumatic.  Right Ear: External ear normal.  Left Ear: External ear normal.  Mouth/Throat: Oropharynx is clear and moist. No oropharyngeal exudate.  Eyes: Conjunctivae and EOM are normal. Pupils are equal, round, and reactive to light. Right eye exhibits no discharge. Left eye exhibits no discharge. No scleral icterus.  Neck: Normal range of motion. Neck supple. No JVD present. No tracheal deviation present. No thyromegaly present.  Cardiovascular: Normal rate, regular rhythm, normal heart sounds and intact distal pulses.  Exam reveals no gallop and no friction rub.   No murmur heard. Pulmonary/Chest: Effort normal and breath sounds normal. No respiratory distress. She has no wheezes. She has no rales. She exhibits no tenderness.  Coarse breath sounds  Abdominal: Soft. Bowel sounds are normal. She exhibits no distension and no mass. There is no tenderness. There is no rebound and no guarding.  Musculoskeletal: Normal range of motion. She exhibits no edema or tenderness.  Lymphadenopathy:    She has no cervical adenopathy.  Neurological: She is alert and oriented to person, place, and time. She has normal reflexes. No cranial nerve deficit. She exhibits normal muscle tone. Coordination normal.  Skin: Skin is warm and dry. No rash noted. She is not diaphoretic. No  erythema. No pallor.  Psychiatric: She has a normal mood and affect. Her behavior is normal. Judgment and thought content normal.  Vitals reviewed.   Filed Vitals:   09/21/15 1422  BP: 138/70  Pulse: 84  Height: 5\' 1"  (1.549 m)  Weight: 126 lb (57.153 kg)  SpO2: 93%        Assessment:       ICD-9-CM ICD-10-CM   1. COPD, severe ([place]) 496 J44.9        Plan:      PFT stable in 2 years Symptom worsening due to heat and difficulty affording inhaler and physical deconditioning and possibly exertional hypoxemia Respect desire not to trake prednisone  Plan Continue spiriva in morning and advair at night as before with albuterol as needed  - take sample Continue o2 at night Walk test in room air 09/21/2015 and if desaturates -start exertional o2 at home Refer pulmonary rehab  Flu shot in fall  Folllowup 4-6 months or sooner  if needed  (> 50% of this 15 min visit spent in face to face counseling or/and coordination of care)  Dr. Brand Males, M.D., [facility].C.P Pulmonary and Critical Care Medicine Staff Physician [place] [facility] Pager: [phone], If no answer or between  15:00h - 7:00h: call [phone]  09/21/2015 3:11 PM

## 2015-09-23 ENCOUNTER — Ambulatory Visit: Payer: Self-pay | Admitting: Internal Medicine

## 2015-10-08 DIAGNOSIS — K219 Gastro-esophageal reflux disease without esophagitis: Secondary | ICD-10-CM | POA: Diagnosis not present

## 2015-10-08 DIAGNOSIS — M8580 Other specified disorders of bone density and structure, unspecified site: Secondary | ICD-10-CM | POA: Diagnosis not present

## 2015-10-26 ENCOUNTER — Telehealth: Payer: Self-pay | Admitting: Internal Medicine

## 2015-10-26 NOTE — Telephone Encounter (Signed)
Called and spoke with pt and she is wanting to know if she can use the anoro instead of the breo.  She stated  That her PCP has samples of the anoro and she is trying to stay out of the donut hole.  MR please advise. Thanks.    Allergies  Allergen Reactions  . Sulfa Antibiotics Hives and Rash

## 2015-10-27 NOTE — Telephone Encounter (Signed)
Her personal regimen was spiriva in AM and advair at night - this has to be dc'ed. Then she can swithc to [facility] daily - welcome to get sample from us/pcp FULP, CAMMIE, MD

## 2015-10-27 NOTE — Telephone Encounter (Signed)
Spoke with pt to make aware of recs.  Nothing further needed.

## 2015-11-28 DIAGNOSIS — Z1231 Encounter for screening mammogram for malignant neoplasm of breast: Secondary | ICD-10-CM | POA: Diagnosis not present

## 2015-11-28 DIAGNOSIS — Z803 Family history of malignant neoplasm of breast: Secondary | ICD-10-CM | POA: Diagnosis not present

## 2015-12-09 DIAGNOSIS — Z961 Presence of intraocular lens: Secondary | ICD-10-CM | POA: Diagnosis not present

## 2015-12-09 DIAGNOSIS — H43813 Vitreous degeneration, bilateral: Secondary | ICD-10-CM | POA: Diagnosis not present

## 2015-12-09 DIAGNOSIS — H5213 Myopia, bilateral: Secondary | ICD-10-CM | POA: Diagnosis not present

## 2015-12-09 DIAGNOSIS — D3131 Benign neoplasm of right choroid: Secondary | ICD-10-CM | POA: Diagnosis not present

## 2015-12-23 ENCOUNTER — Encounter: Payer: Self-pay | Admitting: Internal Medicine

## 2015-12-23 ENCOUNTER — Ambulatory Visit (INDEPENDENT_AMBULATORY_CARE_PROVIDER_SITE_OTHER): Payer: Medicare Other | Admitting: Internal Medicine

## 2015-12-23 VITALS — BP 102/66 | HR 102 | Ht 61.0 in | Wt 128.8 lb

## 2015-12-23 DIAGNOSIS — Z23 Encounter for immunization: Secondary | ICD-10-CM

## 2015-12-23 DIAGNOSIS — J449 Chronic obstructive pulmonary disease, unspecified: Secondary | ICD-10-CM | POA: Diagnosis not present

## 2015-12-23 NOTE — Progress Notes (Signed)
Subjective:     Patient ID: Sierra Warner, female   DOB: 03-Jun-1928, 82 y.o.   MRN: [id number]  HPI   OV 02/26/2013   Chief Complaint  Patient presents with  . Pulmonary Consult    switch from MW to MR. Pt c/o having SOB with activity, occasional dry cough, wheezing and chest tightness that is worse with colder weather.   . Medication Problem    pt states Spiriva is expensive, asking for samples.    Last seen by Dr. Casimiro Needle wert to [name] 01/14/2013. This is a followup for the same but is also switching providers to Dr. Marchelle Gearing which is myself today she reports an overall COPD stable although this winter 2014/2015 it appears the cold weather is bothering her more and she is more dyspneic than her baseline dyspnea. Dyspnea is exertional and relieved by rest. Distance in effort tolerance not clear. Cough is very minimal. She clearly indicates that she's not in a COPD exacerbation situation without worsening of cough of sputum production.  She gives history of 2-3 COPD exacerbation severe 2014 treated either pulmonary office with antibiotic and prednisone all of her primary care physician. She denies having had pulmonary rehabilitation. She's not had alpha-1 testing.  She wantss improve her quality of life. Currently she is being maintained on nocturnal oxygen which she does not use and along with Spiriva once daily and perform his in budesonide nebulizers with which he is compliant. These medications help her but she finds Spiriva difficult effort especially in the donut hole she wants samples. She's not sure should be interested in research trials     Pulmonary function test 05/02/2011 - [name]. FEV1 0.8 L/51% which is 18%  BD  response. FVC is 1.9/82%. Ratio 42.  DLCO 8.6/57%  -  reports that she quit smoking about 5 years ago. Her smoking use included Cigarettes. She has a 13.5 pack-year smoking history. She has never used smokeless tobacco. - CXR 01/12/12 -  emphysemna, RML scarring, THoracic spondy   REC #COPD - currently clinically stable and not in flare up but we can do thinkgs to get you feeling better - continue spiriva once daily  - continue noctyuranl oxygen  - continue performist and budesonide nebs as before - test alpha 1 genetic test for copd today - [name] will refer you to pulm rehab on basis of moderate copd - think about research trials we discussed - glad you are uptodate with flu shot  - have PREVNAR pneumonia vaccine 02/26/2013 based on fact last pneumonia vaccine (was > 1 year to 20 years ago)   #Followup - do spirometry with Jerolyn Shin in 2 months - 2 months  After doing spirometry   OV 06/03/2013  Chief Complaint  Patient presents with  . COPD    follow-up. Pt states she has been having increased productive cough with clear phlegm, increased SOB and chest tightness and wheezing x 2 weeks.     Followup for COPD. 2 years ago she was [name] but do pulmonary function test shows that she slipped and to [name] despite quitting smoking. Details are below. However, now that spring is here she feels that her allergies have kicked up. Historically that allergy season has been the worst for her. Last 2 weeks she's had increasing cough and mucus production but she does not feel that she is in a COPD flareup. No worsening wheeze or dyspnea. She not interested  in antibiotics or steroids. In addition, she feels that her nebulizer bronchodilator and steroids but extremely inconvenient to her lifestyle and actually makes her cough paradoxically. She loves her Spiriva and Combivent when necessary. She wants change of her nebulizers to an inhaler. She plans to start rehab in summer 2015  In terms of allergy workup: She does not want to be seen by an allergist for the time being  Pulmonary function test 06/03/2013 shows FEV1 0.66 L/42%. FVC 1.48 L or 69%. Ratio 45. DLCO of 8.2/40%. Findings are consistent with  [name]. Above numbers reflect post bronchodilator response which was 0%. In addition FEV1 also represent 140 cc loss of lung function in just under 2 years which is beyond age specific loss. This is despite quitting cigarette smoking 6 years ago  Alpha 1 levesl normla dec 2014 but no phenotyping done   reports that she quit smoking about 6 years ago. Her smoking use included Cigarettes. She has a 13.5 pack-year smoking history. She has never used smokeless tobacco.  #COPD  - progressive lung function loss from feb 2013 through March 2015 but not in flare up - agree that nebulizer is inconvenient for you - change nebs to advair 100/50 1 puff twice daily - continue spiriva    #Allergies  - continue your zyrtec - if this is aproblem, in future I can refer you to allergy specialist  #FOllowup 4 months or sooner if    OV 10/01/2013  Chief Complaint  Patient presents with  . Follow-up    Pt states her breathing has worsened d/t hot weather. C/o increase SOB d/t weather. Denies cough and CP/tightness.    Followup [name]  - She is struggling to 5 inhaler regimen of Spiriva and Advair. Therefore she is has stopped Spiriva and is taking Advair only once a day. She is supplementing this by using her nebulizers at night. She's not happy with this regimen because of cost. At the same time because of this, his symptoms are slightly worse than baseline but she denies she is in flare up. She is also not compliant with using oxygen at night due to inconvenience reasons. At the same time she does not want to give up on the oxygen. She gives a somewhat he does making her more symptomatic with shortness of breath. I have agreed to help her with samples through the summer and then reassess in the fall as to what will be the best balance of cost and effective inhaler regimen     11/26/2013 Follow Up COPD  Returns for COPD follow up .  Does Advair 1 puff in am, Perforomist At  bedtime   Does Spiriva every other day. Seems to do very well for her Wants to avoid Doughnut hole>meds are very expensive.  Overall feels she is doing okay .  No flare in cough, wheezing or shortness of breath No fever, chest pain edema or hemoptysis.  On O2 at bedtime.  >flu shot   12/27/2013 Follow up  Complains over last 2 weeks of increased cough, congestion, wheezing and tightness.  Was seen by PCP last week with rx for Levaquin and Prednisone taper. Starting to feel better, cough and wheezing are less. Has finished her steroid and abx.   Also seen in ER on 10/2  with visual changes , underwent CT head that was neg. CXR w/ no acute process.  Feels she may have gotten some neb med mist  in her eye .  Right sided blurred vision resolved.  Denies f/c/s, n/v/d, hemoptysis, PND, leg swelling.     OV 02/17/2014  Chief Complaint  Patient presents with  . Follow-up    Pt states her breathing is unchanged since last OV with TP. Pt c/o DOE. Pt denies cough and CP/tightness.    Follow-up Gold stage 3I COPD with reversibility ex-smoker   Overall COPD stable without any exacerbations since last visit with nurse practitioner 12/27/2013. She is now in the donut hole and wants samples. She is up-to-date with her vaccines. Her main issue is fatigue at the end of the day and particularly with exertion. She has never attended pulmonary rehabilitation even though I have counseled her to do so. She cites different logistical reasons even though she is retired. I discussed some recent studies within she's not interested   Past medical history reviewed: No issues no changes    08/08/14 Follow up : COPD  Pt returns for follow up .  Overall doing well with no flare of cough , dyspnea or wheeizng .  On Advair in am and budesonide /performist in pm Takes Spiriva every other day.  She uses this regimen to keep costs down.  Does get winded at time with activity.  CXR 12/2013 w/ no acute process.   PVX and Prevnar are utd .  No chest pain ,orthopnea, edema or fever.    OV 10/13/2014  Chief Complaint  Patient presents with  . Follow-up    Pt states her breathing has worsened and she thinks its due to the weather. Pt c/o denies cough and CP/tightness.     Follow-up [name]  She continues on her personal cocktail regimen of Spiriva alternating with long-acting beta agonist/inhale corticosteroid-induced other day. The long-acting beta agonist inhaled cortical steroidal fluctuates between Advair in the morning and nebulizer in the evening. Overall COPD stable but with the recent heat and humidity she's having worsening shortness of breath but no change in cough or sputum production or wheezing or edema or chest pain. She does not want steroidal's.     OV 04/17/2015  Chief Complaint  Patient presents with  . Follow-up    Pt states she feels her breathing has worsened since last OV - pt states she thinks it is due to the cold weather and dry air. Pt c/o coughing after neb meds.    Follow-up [name]  She continues her personal cocktail regimen of Spiriva once daily in the morning and Advair at night. She is not using the nebulizers because it makes her cough and shortness of breath worse. She is feeling the effect of the winter in 2 ways. The dry heat in her house makes her skin itch he did she's complaining of dry skin it is at least moderate in intensity. It is new. In addition since Christmas 2016 and she is having progressive worsening of cough and shortness of breath and chest tightness. Although on the COPD cat score the symptoms go to same and stable. She does not want to try prednisone burst. She does not feel she is in COPD exacerbation. She feels this is general worsening. She is willing to try another inhaler.    has a past medical history of Osteopenia; Atrophic vaginitis; Hemorrhoids; COPD, mild ([place]); and Microscopic hematuria.  OV 09/21/2015  Chief  Complaint  Patient presents with  . Follow-up    PFT results. pt states breathing has worsen w/ any exertion since last OV feels it's  due to the hot weather. c/o increased sob, occ prod cough w/white mucus, occ wheezing.   Follow-up [name]  She continues a personal cocktail regimen of Spiriva in the morning and Advair at night. She will not do nebulizers because it makes her symptoms worse. She is feeling the effect of the summer. Her symptoms are worse. Especially in the last 2 months. COPD cat score is 28. She does not want to do prednisone. She is also having some difficulty affording her inhalers for this is accounting for it aas well. In addition she's always declined to pulmonary rehabilitation and she is becoming more sedentary. This time she is more open to doing it. She does use oxygen at night but she's always been reluctant to use it with exertion but this time she says if she desaturates with exertion she will use it in the daytime. She had pulmonary function test 09/07/2015 this was reviewed and confirmed [name] with an FEV1 of 48% which is similar to 2 years ago without any decline. This no associated chest pain  Of note last time I gave her Brio but she could not afford it.     OV 12/23/2015  Chief Complaint  Patient presents with  . Follow-up    Pt states that her breathing is unchanged since last visit. Would like flu vaccine today,  Discuss medications - using Breo, Advair and Spiriva alternating. Pt not using nebulizer meds - pt states that they are too much trouble   Follow-up [name].  Patient presents for routine follow-up. Last visit 09/21/2015. Overall she feels COPD stable. Her main complaint is that she is in the donut hole and is unable to afford her inhalers. She is managed getting samples from different physicians sources. At this point in time she takes Spiriva, Advair, Brio [Brio is essentially Advair], and anoro  but  fortunately she does not take it all in one day. She takes each inhaler 1 day and rotates them. This is a personal cocktail and it is working well for her. She's not doing her nebulizers but it is on the medication list. She is interested in having flu shot today. She wants inhaler samples.      CAT COPD Symptom & Quality of Life Score (GSK trademark) 0 is no burden. 5 is highest burden 02/26/2013  04/17/2015  09/21/2015   Never Cough -> Cough all the time 3 3 3   No phlegm in chest -> Chest is full of phlegm 3 3 3   No chest tightness -> Chest feels very tight 3 3 3   No dyspnea for 1 flight stairs/hill -> Very dyspneic for 1 flight of stairs 3 4 5   No limitations for ADL at home -> Very limited with ADL at home 2 2 4   Confident leaving home -> Not at all confident leaving home 0 1 2  Sleep soundly -> Do not sleep soundly because of lung condition 3 2 3   Lots of Energy -> No energy at all 3 3 5   TOTAL Score (max 40)  20 21 28     Review of Systems     Objective:   Physical Exam  Constitutional: She is oriented to person, place, and time. She appears well-developed and well-nourished. No distress.  HENT:  Head: Normocephalic and atraumatic.  Right Ear: External ear normal.  Left Ear: External ear normal.  Mouth/Throat: Oropharynx is clear and moist. No oropharyngeal exudate.  Eyes: Conjunctivae and EOM are  normal. Pupils are equal, round, and reactive to light. Right eye exhibits no discharge. Left eye exhibits no discharge. No scleral icterus.  Neck: Normal range of motion. Neck supple. No JVD present. No tracheal deviation present. No thyromegaly present.  Cardiovascular: Normal rate, regular rhythm, normal heart sounds and intact distal pulses.  Exam reveals no gallop and no friction rub.   No murmur heard. Pulmonary/Chest: Effort normal and breath sounds normal. No respiratory distress. She has no wheezes. She has no rales. She exhibits no tenderness.  Abdominal: Soft. Bowel sounds  are normal. She exhibits no distension and no mass. There is no tenderness. There is no rebound and no guarding.  Musculoskeletal: Normal range of motion. She exhibits no edema or tenderness.  Lymphadenopathy:    She has no cervical adenopathy.  Neurological: She is alert and oriented to person, place, and time. She has normal reflexes. No cranial nerve deficit. She exhibits normal muscle tone. Coordination normal.  Skin: Skin is warm and dry. No rash noted. She is not diaphoretic. No erythema. No pallor.  Psychiatric: She has a normal mood and affect. Her behavior is normal. Judgment and thought content normal.  Vitals reviewed.   Vitals:   12/23/15 1434  BP: 102/66  Pulse: (!) 102  SpO2: 95%  Weight: 128 lb 12.8 oz (58.4 kg)  Height: 5\' 1"  (1.549 m)   Estimated body mass index is 24.34 kg/m as calculated from the following:   Height as of this encounter: 5\' 1"  (1.549 m).   Weight as of this encounter: 128 lb 12.8 oz (58.4 kg).       Assessment:       ICD-9-CM ICD-10-CM   1. COPD, severe ([place]) 496 J44.9        Plan:      PFT stable   Plan Continue spiriva daily, next day advair, next day anoro, next day breo (your personal cocktail)  - take samples  - see if [facility] will honor discount program for you Continue o2 at night High dose flu shot 12/23/2015  Folllowup 6 months or sooner if needed    Dr. Brand Males, M.D., [facility].C.P Pulmonary and Critical Care Medicine Staff Physician [place] [facility] Pager: [phone], If no answer or between  15:00h - 7:00h: call [phone]  12/23/2015 2:58 PM

## 2015-12-23 NOTE — Patient Instructions (Addendum)
ICD-9-CM ICD-10-CM   1. COPD, severe ([place]) 496 J44.9     PFT stable   Plan Continue spiriva daily, next day advair, next day anoro, next day breo (your personal cocktail)  - take samples  - see if [facility] will honor discount program for you Continue o2 at night High dose flu shot 12/23/2015  Folllowup 6 months or sooner if needed

## 2016-01-01 DIAGNOSIS — R1032 Left lower quadrant pain: Secondary | ICD-10-CM | POA: Diagnosis not present

## 2016-01-01 DIAGNOSIS — J449 Chronic obstructive pulmonary disease, unspecified: Secondary | ICD-10-CM | POA: Diagnosis not present

## 2016-02-22 DIAGNOSIS — R1904 Left lower quadrant abdominal swelling, mass and lump: Secondary | ICD-10-CM | POA: Diagnosis not present

## 2016-04-05 DIAGNOSIS — E559 Vitamin D deficiency, unspecified: Secondary | ICD-10-CM | POA: Diagnosis not present

## 2016-04-05 DIAGNOSIS — K219 Gastro-esophageal reflux disease without esophagitis: Secondary | ICD-10-CM | POA: Diagnosis not present

## 2016-04-05 DIAGNOSIS — M8580 Other specified disorders of bone density and structure, unspecified site: Secondary | ICD-10-CM | POA: Diagnosis not present

## 2016-04-05 DIAGNOSIS — I10 Essential (primary) hypertension: Secondary | ICD-10-CM | POA: Diagnosis not present

## 2016-04-05 DIAGNOSIS — J449 Chronic obstructive pulmonary disease, unspecified: Secondary | ICD-10-CM | POA: Diagnosis not present

## 2016-04-13 DIAGNOSIS — I10 Essential (primary) hypertension: Secondary | ICD-10-CM | POA: Diagnosis not present

## 2016-06-03 ENCOUNTER — Encounter: Payer: Self-pay | Admitting: Adult Health

## 2016-06-03 ENCOUNTER — Ambulatory Visit (INDEPENDENT_AMBULATORY_CARE_PROVIDER_SITE_OTHER): Payer: Medicare Other | Admitting: Adult Health

## 2016-06-03 ENCOUNTER — Ambulatory Visit (INDEPENDENT_AMBULATORY_CARE_PROVIDER_SITE_OTHER)
Admission: RE | Admit: 2016-06-03 | Discharge: 2016-06-03 | Disposition: A | Discharge status: Home / Self Care | Payer: Medicare Other | Source: Ambulatory Visit | Attending: Adult Health | Admitting: Adult Health

## 2016-06-03 VITALS — BP 124/72 | HR 107 | Temp 98.0°F | Ht 61.0 in | Wt 125.6 lb

## 2016-06-03 DIAGNOSIS — R0602 Shortness of breath: Secondary | ICD-10-CM | POA: Diagnosis not present

## 2016-06-03 DIAGNOSIS — J9611 Chronic respiratory failure with hypoxia: Secondary | ICD-10-CM

## 2016-06-03 DIAGNOSIS — J449 Chronic obstructive pulmonary disease, unspecified: Secondary | ICD-10-CM | POA: Diagnosis not present

## 2016-06-03 DIAGNOSIS — R05 Cough: Secondary | ICD-10-CM | POA: Diagnosis not present

## 2016-06-03 MED ORDER — AZITHROMYCIN 250 MG PO TABS: ORAL_TABLET | ORAL | 0 refills | Status: DC

## 2016-06-03 MED ORDER — FLUTICASONE FUROATE-VILANTEROL 100-25 MCG/INH IN AEPB
1.0000 | INHALATION_SPRAY | Freq: Every day | RESPIRATORY_TRACT | 0 refills | Status: AC
Start: 2016-06-03 — End: 2016-06-04

## 2016-06-03 NOTE — Assessment & Plan Note (Signed)
Cont on o2 At bedtime   

## 2016-06-03 NOTE — Patient Instructions (Addendum)
Zpack take as directed. Mucinex DM Twice daily  As needed  Cough/congestion .  Continue on BREO and Spiriva .  Continue on Oxygen 2l/m At bedtime   Check chest xray today .  Please contact office for sooner follow up if symptoms do not improve or worsen or seek emergency care  follow up Dr. [name] as planned next month and As needed

## 2016-06-03 NOTE — Addendum Note (Signed)
Addended by: Parke Poisson E on: 06/03/2016 12:47 PM   Modules accepted: Orders

## 2016-06-03 NOTE — Progress Notes (Signed)
@Patient  ID: Sierra Warner, female    DOB: April 27, 1928, 83 y.o.   MRN: [id number]  Chief Complaint  Patient presents with  . Acute Visit    copd     Referring provider: Antony Blackbird, MD  HPI: 83 year old female former smoker followed for Gold 3 COPD  06/03/2016 Acute OV  Pt presents for an acute office visit. Complains of 2 weeks of cough, congestion w/ thick mucus , increased dyspnea. No fever, hemoptysis , chest pain, orthopnea, edema.  Taking OTC without no help (claritin , robitussin ) . Has sinus cngestion and drainage.  She remains on [place] . Does tell me she alternates the inhalers and takes one every other day.  On O2 At bedtime  .  Appetite is good with no n/v/d.     Allergies  Allergen Reactions  . Sulfa Antibiotics Hives and Rash    Immunization History  Administered Date(s) Administered  . Influenza Split 12/14/2011, 12/18/2012  . Influenza Whole 12/20/2010  . Influenza, High Dose Seasonal PF 12/23/2015  . Influenza,inj,Quad PF,36+ Mos 11/26/2013, 12/20/2014  . Pneumococcal Conjugate-13 02/26/2013  . Pneumococcal Polysaccharide-23 10/13/2014    Past Medical History:  Diagnosis Date  . Atrophic vaginitis   . COPD, mild ([place])   . Hemorrhoids   . Microscopic hematuria   . Osteopenia     Tobacco History: History  Smoking Status  . Former Smoker  . Packs/day: 0.30  . Years: 45.00  . Types: Cigarettes  . Quit date: 03/22/2007  Smokeless Tobacco  . Never Used   Counseling given: Not Answered   Outpatient Encounter Prescriptions as of 06/03/2016  Medication Sig  . albuterol-ipratropium (COMBIVENT) 18-103 MCG/ACT inhaler Inhale 1 puff into the lungs every 6 (six) hours as needed.   . Calcium Carbonate-Vitamin D (CALCIUM + D PO) Take by mouth 2 (two) times daily.    . cholecalciferol (VITAMIN D) 1000 UNITS tablet Take 1,000 Units by mouth daily.  . Fluticasone Furoate-Vilanterol (BREO ELLIPTA) 100-25 MCG/INH AEPB Inhale 1 puff into the lungs  daily.  Marland Kitchen losartan-hydrochlorothiazide (HYZAAR) 100-12.5 MG per tablet   . Multiple Vitamins-Minerals (MULTIVITAMIN GUMMIES ADULT PO) Take 1 tablet by mouth daily.   . Polyethylene Glycol 3350 (MIRALAX PO) Take 1 packet by mouth daily.   . Tiotropium Bromide Monohydrate (SPIRIVA RESPIMAT) 2.5 MCG/ACT AERS Inhale 1 puff into the lungs daily.  . Fluticasone-Salmeterol (ADVAIR DISKUS) 100-50 MCG/DOSE AEPB Inhale 1 puff into the lungs 2 (two) times daily. (Patient not taking: Reported on 06/03/2016)   No facility-administered encounter medications on file as of 06/03/2016.      Review of Systems  Constitutional:   No  weight loss, night sweats,  Fevers, chills,  +fatigue, or  lassitude.  HEENT:   No headaches,  Difficulty swallowing,  Tooth/dental problems, or  Sore throat,                No sneezing, itching, ear ache,  +nasal congestion, post nasal drip,   CV:  No chest pain,  Orthopnea, PND, swelling in lower extremities, anasarca, dizziness, palpitations, syncope.   GI  No heartburn, indigestion, abdominal pain, nausea, vomiting, diarrhea, change in bowel habits, loss of appetite, bloody stools.   Resp:   No chest wall deformity  Skin: no rash or lesions.  GU: no dysuria, change in color of urine, no urgency or frequency.  No flank pain, no hematuria   MS:  No joint pain or swelling.  No decreased range of motion.  No back pain.    Physical Exam  BP 124/72 (BP Location: Left Arm, Patient Position: Sitting, Cuff Size: Normal)   Pulse (!) 107   Temp 98 F (36.7 C) (Oral)   Ht 5\' 1"  (1.549 m)   Wt 125 lb 9.6 oz (57 kg)   SpO2 91%   BMI 23.73 kg/m   GEN: A/Ox3; pleasant , NAD, elderly    HEENT:  [place]/AT,  EACs-clear, TMs-wnl, NOSE-clear, THROAT-clear, no lesions, no postnasal drip or exudate noted.   NECK:  Supple w/ fair ROM; no JVD; normal carotid impulses w/o bruits; no thyromegaly or nodules palpated; no lymphadenopathy.    RESP  Few trace rhonchi , no accessory muscle  use, no dullness to percussion  CARD:  RRR, no m/r/g, no peripheral edema, pulses intact, no cyanosis or clubbing.  GI:   Soft & nt; nml bowel sounds; no organomegaly or masses detected.   Musco: Warm bil, no deformities or joint swelling noted.   Neuro: alert, no focal deficits noted.    Skin: Warm, no lesions or rashes    Lab Results:   Rexene Edison, NP 06/03/2016

## 2016-06-03 NOTE — Assessment & Plan Note (Signed)
Exacerbation  Check cxr today   Plan  Patient Instructions  Zpack take as directed. Mucinex DM Twice daily  As needed  Cough/congestion .  Continue on BREO and Spiriva .  Continue on Oxygen 2l/m At bedtime   Check chest xray today .  Please contact office for sooner follow up if symptoms do not improve or worsen or seek emergency care  follow up Dr. [name] as planned next month and As needed

## 2016-06-06 ENCOUNTER — Telehealth: Payer: Self-pay | Admitting: Internal Medicine

## 2016-06-06 NOTE — Telephone Encounter (Signed)
error 

## 2016-06-07 ENCOUNTER — Emergency Department (HOSPITAL_COMMUNITY): Payer: Medicare Other

## 2016-06-07 ENCOUNTER — Ambulatory Visit (INDEPENDENT_AMBULATORY_CARE_PROVIDER_SITE_OTHER): Payer: Medicare Other | Admitting: Internal Medicine

## 2016-06-07 ENCOUNTER — Encounter (HOSPITAL_COMMUNITY): Payer: Self-pay | Admitting: Emergency Medicine

## 2016-06-07 ENCOUNTER — Emergency Department (HOSPITAL_COMMUNITY)
Admission: EM | Admit: 2016-06-07 | Discharge: 2016-06-07 | Discharge status: Absent without leave | Payer: Medicare Other | Attending: Emergency Medicine | Admitting: Emergency Medicine

## 2016-06-07 ENCOUNTER — Inpatient Hospital Stay
Admission: AD | Admit: 2016-06-07 | Payer: BLUE CROSS/BLUE SHIELD | Source: Ambulatory Visit | Admitting: Internal Medicine

## 2016-06-07 ENCOUNTER — Inpatient Hospital Stay (HOSPITAL_COMMUNITY)
Admission: EM | Admit: 2016-06-07 | Discharge: 2016-06-09 | DRG: 190 | Disposition: A | Discharge status: Home / Self Care | Payer: Medicare Other | Attending: Family Medicine | Admitting: Family Medicine

## 2016-06-07 ENCOUNTER — Encounter: Payer: Self-pay | Admitting: Internal Medicine

## 2016-06-07 DIAGNOSIS — Z8249 Family history of ischemic heart disease and other diseases of the circulatory system: Secondary | ICD-10-CM

## 2016-06-07 DIAGNOSIS — Z87891 Personal history of nicotine dependence: Secondary | ICD-10-CM

## 2016-06-07 DIAGNOSIS — D72829 Elevated white blood cell count, unspecified: Secondary | ICD-10-CM | POA: Diagnosis present

## 2016-06-07 DIAGNOSIS — T380X5A Adverse effect of glucocorticoids and synthetic analogues, initial encounter: Secondary | ICD-10-CM | POA: Diagnosis present

## 2016-06-07 DIAGNOSIS — Z882 Allergy status to sulfonamides status: Secondary | ICD-10-CM | POA: Diagnosis not present

## 2016-06-07 DIAGNOSIS — M8580 Other specified disorders of bone density and structure, unspecified site: Secondary | ICD-10-CM | POA: Diagnosis present

## 2016-06-07 DIAGNOSIS — J441 Chronic obstructive pulmonary disease with (acute) exacerbation: Secondary | ICD-10-CM | POA: Diagnosis not present

## 2016-06-07 DIAGNOSIS — R0602 Shortness of breath: Secondary | ICD-10-CM | POA: Diagnosis not present

## 2016-06-07 DIAGNOSIS — Z833 Family history of diabetes mellitus: Secondary | ICD-10-CM

## 2016-06-07 DIAGNOSIS — Z79899 Other long term (current) drug therapy: Secondary | ICD-10-CM

## 2016-06-07 DIAGNOSIS — I10 Essential (primary) hypertension: Secondary | ICD-10-CM | POA: Diagnosis not present

## 2016-06-07 DIAGNOSIS — Z7951 Long term (current) use of inhaled steroids: Secondary | ICD-10-CM

## 2016-06-07 DIAGNOSIS — J9620 Acute and chronic respiratory failure, unspecified whether with hypoxia or hypercapnia: Secondary | ICD-10-CM

## 2016-06-07 DIAGNOSIS — J9601 Acute respiratory failure with hypoxia: Secondary | ICD-10-CM | POA: Diagnosis not present

## 2016-06-07 DIAGNOSIS — E871 Hypo-osmolality and hyponatremia: Secondary | ICD-10-CM | POA: Diagnosis not present

## 2016-06-07 HISTORY — DX: Chronic obstructive pulmonary disease, unspecified: J44.9

## 2016-06-07 HISTORY — DX: Essential (primary) hypertension: I10

## 2016-06-07 LAB — URINALYSIS, ROUTINE W REFLEX MICROSCOPIC
BILIRUBIN URINE: NEGATIVE
Glucose, UA: NEGATIVE mg/dL
Hgb urine dipstick: NEGATIVE
Ketones, ur: 20 mg/dL — AB
NITRITE: NEGATIVE
Protein, ur: NEGATIVE mg/dL
SPECIFIC GRAVITY, URINE: 1.028 (ref 1.005–1.030)
pH: 5 (ref 5.0–8.0)

## 2016-06-07 LAB — COMPREHENSIVE METABOLIC PANEL
ALBUMIN: 4.1 g/dL (ref 3.5–5.0)
ALK PHOS: 71 U/L (ref 38–126)
ALT: 24 U/L (ref 14–54)
ANION GAP: 9 (ref 5–15)
AST: 34 U/L (ref 15–41)
BUN: 14 mg/dL (ref 6–20)
CALCIUM: 9.2 mg/dL (ref 8.9–10.3)
CO2: 25 mmol/L (ref 22–32)
Chloride: 95 mmol/L — ABNORMAL LOW (ref 101–111)
Creatinine, Ser: 0.67 mg/dL (ref 0.44–1.00)
GFR calc Af Amer: >60 mL/min (ref >60)
GFR calc non Af Amer: >60 mL/min (ref >60)
GLUCOSE: 116 mg/dL — AB (ref 65–99)
Potassium: 4.1 mmol/L (ref 3.5–5.1)
SODIUM: 129 mmol/L — AB (ref 135–145)
Total Bilirubin: 0.8 mg/dL (ref 0.3–1.2)
Total Protein: 7.1 g/dL (ref 6.5–8.1)

## 2016-06-07 LAB — I-STAT TROPONIN, ED: TROPONIN I, POC: 0 ng/mL (ref 0.00–0.08)

## 2016-06-07 LAB — CBC
HEMATOCRIT: 41.1 % (ref 36.0–46.0)
HEMOGLOBIN: 13.8 g/dL (ref 12.0–15.0)
MCH: 29.9 pg (ref 26.0–34.0)
MCHC: 33.6 g/dL (ref 30.0–36.0)
MCV: 89.2 fL (ref 78.0–100.0)
Platelets: 315 10*3/uL (ref 150–400)
RBC: 4.61 MIL/uL (ref 3.87–5.11)
RDW: 13.4 % (ref 11.5–15.5)
WBC: 13.3 10*3/uL — ABNORMAL HIGH (ref 4.0–10.5)

## 2016-06-07 MED ORDER — LORAZEPAM 2 MG/ML IJ SOLN
1.0000 mg | Freq: Once | INTRAMUSCULAR | Status: AC
  Administered 2016-06-07: 1 mg via INTRAVENOUS
  Filled 2016-06-07: qty 1

## 2016-06-07 MED ORDER — MAGNESIUM SULFATE 50 % IJ SOLN: 2.0000 g | Freq: Once | INTRAMUSCULAR | Status: DC

## 2016-06-07 MED ORDER — LEVALBUTEROL HCL 0.63 MG/3ML IN NEBU
0.6300 mg | INHALATION_SOLUTION | Freq: Once | RESPIRATORY_TRACT | Status: AC
  Administered 2016-06-07: 0.63 mg via RESPIRATORY_TRACT

## 2016-06-07 MED ORDER — IPRATROPIUM BROMIDE 0.02 % IN SOLN
RESPIRATORY_TRACT | Status: AC
  Administered 2016-06-07: 0.5 mg
  Filled 2016-06-07: qty 2.5

## 2016-06-07 MED ORDER — IPRATROPIUM-ALBUTEROL 0.5-2.5 (3) MG/3ML IN SOLN
3.0000 mL | Freq: Four times a day (QID) | RESPIRATORY_TRACT | Status: DC
  Administered 2016-06-07 – 2016-06-08 (×5): 3 mL via RESPIRATORY_TRACT
  Filled 2016-06-07 (×5): qty 3

## 2016-06-07 MED ORDER — METHYLPREDNISOLONE SODIUM SUCC 125 MG IJ SOLR
125.0000 mg | Freq: Once | INTRAMUSCULAR | Status: AC
  Administered 2016-06-07: 125 mg via INTRAVENOUS
  Filled 2016-06-07: qty 2

## 2016-06-07 MED ORDER — METHYLPREDNISOLONE ACETATE 80 MG/ML IJ SUSP
80.0000 mg | Freq: Once | INTRAMUSCULAR | Status: AC
  Administered 2016-06-07: 80 mg via INTRAMUSCULAR

## 2016-06-07 MED ORDER — SODIUM CHLORIDE 0.9 % IV SOLN
Freq: Once | INTRAVENOUS | Status: AC
  Administered 2016-06-07: 16:00:00 via INTRAVENOUS

## 2016-06-07 MED ORDER — ALBUTEROL (5 MG/ML) CONTINUOUS INHALATION SOLN
10.0000 mg/h | INHALATION_SOLUTION | Freq: Once | RESPIRATORY_TRACT | Status: AC
  Administered 2016-06-07: 10 mg/h via RESPIRATORY_TRACT
  Filled 2016-06-07: qty 20

## 2016-06-07 MED ORDER — MAGNESIUM SULFATE 2 GM/50ML IV SOLN
2.0000 g | Freq: Once | INTRAVENOUS | Status: AC
  Administered 2016-06-07: 2 g via INTRAVENOUS
  Filled 2016-06-07: qty 50

## 2016-06-07 NOTE — Assessment & Plan Note (Signed)
Acute on chronic respiratory failure secondary to severe COPD exacerbation. Failing outpatient Z-Pak. No lives in the hospital at [facility]. Emergency medical services will be called to transfer to the emergency department. Her car will be remaining at pulmonary office parking lot through the course of admission. She lives alone and admission is definitely recommended

## 2016-06-07 NOTE — Progress Notes (Signed)
Pt seen today 3.20.18 by MR

## 2016-06-07 NOTE — ED Provider Notes (Signed)
[facility] DEPT Provider Note   CSN: [id number] Arrival date & time: 06/07/16  1440     History   Chief Complaint Chief Complaint  Patient presents with  . Shortness of Breath    HPI Sierra Warner is a 83 y.o. female.  HPI   83 year old female with a history of COPD presents today at the request of primary care for hospital admission secondary to COPD exacerbation.  Patient notes symptoms started approximately 1 week ago, was seen by primary care on 06/03/2016 and started on azithromycin.  She followed up in the office today with worsening shortness of breath and continued cough.  In the office she was tachypneic, grunting, unable to complete full sentences but remained at 92% on room air.  She was given a nebulizer of Xopenex, IM solumedrol 80 mg.  No improvements were noted so patient was transferred to the emergency room.  She is a former smoker.    Past Medical History:  Diagnosis Date  . Atrophic vaginitis   . COPD, mild ([place])   . Hemorrhoids   . Microscopic hematuria   . Osteopenia     Patient Active Problem List   Diagnosis Date Noted  . Dry skin 04/17/2015  . COPD, severe ([place]) 02/17/2014  . Environmental and seasonal allergies 06/09/2013  . Acute on chronic respiratory failure ([place]) 06/06/2011  . COPD GOLD II with reversibility 04/04/2011  . Hypertension 04/04/2011  . Senile osteoporosis 10/19/2010    Past Surgical History:  Procedure Laterality Date  . APPENDECTOMY  1933  . CATARACT SURGERY    . CYSTOSCOPY    . THYROIDECTOMY, PARTIAL    . TONSILLECTOMY      OB History    Gravida Para Term Preterm AB Living   3 3 3     2    SAB TAB Ectopic Multiple Live Births                   Home Medications    Prior to Admission medications   Medication Sig Start Date End Date Taking? Authorizing Provider  azithromycin (ZITHROMAX) 250 MG tablet Take 250-500 mg by mouth as directed. Take 2 tablets on Day 1 Then Take 1 tablet BID for Days 2-6. 06/03/16  Yes  Historical Provider, MD  BREO ELLIPTA 100-25 MCG/INH AEPB INL 1 PUFF PO ONCE  A DAY 03/25/16  Yes Historical Provider, MD  Calcium Carbonate-Vitamin D (CALCIUM + D PO) Take by mouth 2 (two) times daily.     Yes Historical Provider, MD  cholecalciferol (VITAMIN D) 1000 UNITS tablet Take 1,000 Units by mouth daily.   Yes Historical Provider, MD  COMBIVENT RESPIMAT 20-100 MCG/ACT AERS respimat Inhale 1 puff into the lungs every 6 (six) hours as needed for wheezing or shortness of breath.  06/05/16  Yes Historical Provider, MD  losartan-hydrochlorothiazide Konrad Penta) 100-12.5 MG per tablet  07/21/14  Yes Historical Provider, MD  Multiple Vitamins-Minerals (MULTIVITAMIN GUMMIES ADULT PO) Take 1 tablet by mouth daily.    Yes Historical Provider, MD  SPIRIVA HANDIHALER 18 MCG inhalation capsule INL THE CONTENTS OF 1 C VIA HANDIHALER PO ONCE D 05/30/16  Yes Historical Provider, MD  Polyethylene Glycol 3350 (MIRALAX PO) Take 1 packet by mouth daily.     Historical Provider, MD  Tiotropium Bromide Monohydrate (SPIRIVA RESPIMAT) 2.5 MCG/ACT AERS Inhale 1 puff into the lungs daily. Patient not taking: Reported on 06/07/2016 08/08/14   Melvenia Needles, NP    Family History Family History  Problem Relation  Age of Onset  . Diabetes Mother   . Hypertension Mother   . Heart disease Father   . Hypertension Father   . Heart failure Father     Social History Social History  Substance Use Topics  . Smoking status: Former Smoker    Packs/day: 0.30    Years: 45.00    Types: Cigarettes    Quit date: 03/22/2007  . Smokeless tobacco: Never Used  . Alcohol use 2.0 oz/week    4 Standard drinks or equivalent per week     Allergies   Sulfa antibiotics   Review of Systems Review of Systems  All other systems reviewed and are negative.    Physical Exam Updated Vital Signs BP (!) 142/83   Pulse (!) 110   Temp 97.9 F (36.6 C) (Oral)   Resp (!) 36   Ht 5\' 1"  (1.549 m)   Wt 57.2 kg   SpO2 98%   BMI 23.81  kg/m   Physical Exam  Constitutional: She is oriented to person, place, and time. She appears well-developed and well-nourished.  HENT:  Head: Normocephalic and atraumatic.  Eyes: Conjunctivae are normal. Pupils are equal, round, and reactive to light. Right eye exhibits no discharge. Left eye exhibits no discharge. No scleral icterus.  Neck: Normal range of motion. No JVD present. No tracheal deviation present.  Pulmonary/Chest: No stridor. She is in respiratory distress. She has wheezes. She exhibits no tenderness.  Neurological: She is alert and oriented to person, place, and time. Coordination normal.  Psychiatric: She has a normal mood and affect. Her behavior is normal. Judgment and thought content normal.  Nursing note and vitals reviewed.    ED Treatments / Results  Labs (all labs ordered are listed, but only abnormal results are displayed) Labs Reviewed  COMPREHENSIVE METABOLIC PANEL - Abnormal; Notable for the following:       Result Value   Sodium 129 (*)    Chloride 95 (*)    Glucose, Bld 116 (*)    All other components within normal limits  CBC - Abnormal; Notable for the following:    WBC 13.3 (*)    All other components within normal limits  URINALYSIS, ROUTINE W REFLEX MICROSCOPIC  I-STAT TROPOININ, ED    EKG  EKG Interpretation  Date/Time:  Tuesday June 07 2016 15:07:42 EDT Ventricular Rate:  112 PR Interval:    QRS Duration: 180 QT Interval:  361 QTC Calculation: 493 R Axis:   84 Text Interpretation:  arifact. no STEMI. nsr Confirmed by Johnney Killian, MD, Jeannie Done [phone]) on 06/07/2016 5:18:39 PM       Radiology Dg Chest 2 View  Result Date: 06/07/2016 CLINICAL DATA:  Shortness of breath. EXAM: CHEST  2 VIEW COMPARISON:  06/03/2016 FINDINGS: The heart size and mediastinal contours are within normal limits. Lungs are hyperinflated and there are coarsened interstitial markings compatible with COPD. Both lungs are clear. The visualized skeletal structures are  unremarkable. IMPRESSION: No active cardiopulmonary disease. Electronically Signed   By: Kerby Moors M.D.   On: 06/07/2016 15:12    Procedures Procedures (including critical care time)  Medications Ordered in ED Medications  ipratropium-albuterol (DUONEB) 0.5-2.5 (3) MG/3ML nebulizer solution 3 mL (not administered)  albuterol (PROVENTIL,VENTOLIN) solution continuous neb (10 mg/hr Nebulization Given 06/07/16 1507)  0.9 %  sodium chloride infusion ( Intravenous New Bag/Given 06/07/16 1607)  ipratropium (ATROVENT) 0.02 % nebulizer solution (0.5 mg  Given 06/07/16 1507)  LORazepam (ATIVAN) injection 1 mg (1 mg Intravenous Given 06/07/16  1552)  magnesium sulfate IVPB 2 g 50 mL (2 g Intravenous New Bag/Given 06/07/16 1607)     Initial Impression / Assessment and Plan / ED Course  I have reviewed the triage vital signs and the nursing notes.  Pertinent labs & imaging results that were available during my care of the patient were reviewed by me and considered in my medical decision making (see chart for details).     Final Clinical Impressions(s) / ED Diagnoses   Final diagnoses:  COPD exacerbation ([place])    Labs: I-STAT troponin, CMP, CBC  Imaging: DG Chest 2 View  Consults: [facility]  Therapeutics: Magnesium, Ativan, albuterol, Atrovent  Discharge Meds:   Assessment/Plan: 83 year old female presents today with respiratory distress secondary to likely COPD exacerbation.  Patient with worsening symptoms over the last week, seen by primary care and sent for hospital admission.  Patient slightly worsened since arriving to the ED, she is placed on BiPAP, given Ativan, magnesium and a continuous neb.  Patient's comfort level improved, though hypoxic episodes while here in the ED. hospitalist service consulted for admission.   New Prescriptions New Prescriptions   No medications on file     Okey Regal, PA-C 06/07/16 2120    Charlesetta Shanks, MD 06/19/16 [phone]

## 2016-06-07 NOTE — ED Notes (Signed)
Patient reports continuous neb is not helping. Patient continues to take off continuous neb and states "I feel worse." PA made aware.

## 2016-06-07 NOTE — ED Notes (Signed)
Respiratory at bedside.

## 2016-06-07 NOTE — Progress Notes (Signed)
Patient placed on 3 L [place]. No distress noted. Patient comfortable and no complaints of SOB. BiPAP on standby in room. RT will continue to monitor patient.

## 2016-06-07 NOTE — ED Notes (Signed)
Bed: WA20 Expected date:  Expected time:  Means of arrival:  Comments: 83 yo from [place]

## 2016-06-07 NOTE — Addendum Note (Signed)
Addended by: Collier Salina on: 06/07/2016 01:30 PM   Modules accepted: Orders

## 2016-06-07 NOTE — Progress Notes (Signed)
Charge RN ED,Terri, called NP asking if pt's level of care could be downgraded. Pt admitted with COPD exacerbation and was previously slated for a step down bed because of Bipap requirement. Pt has been holding in ED for quite a few hours and has been off Bipap normal RR and O2 sat on 3L. Last VS-BP 150s. HR 90-100s. RR 20. SaO2 96% on 3L [place]. NP to bedside. Pt feels a lot better than upon arrival. No SOB except if she is up.  Pt appears well. Able to speak in full sentences without SOB. No wheezing.  Change admit order to tele. ED staff aware. Discussed with pt. KJKG, NP [facility]

## 2016-06-07 NOTE — Patient Instructions (Signed)
Call ems

## 2016-06-07 NOTE — H&P (Signed)
History and Physical  Sierra Warner IZT:[id number] DOB: 1928-12-15 DOA: 06/07/2016   PCP: No PCP Per Patient   Patient coming from: Home  Chief Complaint: dyspnea and cough  HPI:  Sierra Warner is a 83 y.o. female with medical history of COPD and hypertension presents from her pulmonary office with worsening shortness of breath and coughing. The patient has had shortness of breath and coughing with yellow sputum production for 1-2 weeks. She visited her pulmonary office on 06/03/2016. The patient was diagnosed with a COPD exacerbation and started on azithromycin. Unfortunately, her shortness of breath worsened over the next 1-2 days despite some improvement in her sputum production. The patient saw Dr. [name] in the office today. The patient was noted to be tachypnea and grunting with action saturation 92% on room air. She was unable to complete full sentences secondary to shortness of breath. The patient was given a Xopenex nebulizer and IM steroids without improvement. As result, the patient was sent to the hospital for further evaluation. Patient denies any fevers, chills, chest pain, hemoptysis, nausea, vomiting, diarrhea, abdominal pain, dysuria, hematuria.  In the emergency department, the patient afebrile and hemodynamically stable. She was given hour-long albuterol and Atrovent nebulizer. Her respiratory status worsened. She was placed on BiPAP and her respiratory status stabilized. Sodium was 129. The remainder of BMP was unremarkable. WBC was 13.3. EKG shows sinus rhythm without ST or T-wave changes. Chest x-ray showed hyperinflation and coarsened interstitial markings without infiltrate.  Assessment/Plan: Acute respiratory failure with hypoxia -Secondary to COPD exacerbation -Currently stable on BiPAP without increased work of breathing -Wean BiPAP as tolerated  COPD exacerbation -I will order a one-time dose of steroids to be given in the emergency department -Thereafter,  continue IV steroids -Start Pulmicort -Continue DuoNeb every 6 hours -Continue azithromycin  Hypertension -Continue losartan without HCTZ secondary to hyponatremia  Hyponatremia -This appears to be chronic dating back to 2015 -Discontinue HCTZ and recheck BMP  Leukocytosis -UA and urine culture -afebrile and hemodynamically stable -CXR without infiltrate -am CBC      Past Medical History:  Diagnosis Date  . Atrophic vaginitis   . COPD, mild ([place])   . Hemorrhoids   . Microscopic hematuria   . Osteopenia    Past Surgical History:  Procedure Laterality Date  . APPENDECTOMY  1933  . CATARACT SURGERY    . CYSTOSCOPY    . THYROIDECTOMY, PARTIAL    . TONSILLECTOMY     Social History:  reports that she quit smoking about 9 years ago. Her smoking use included Cigarettes. She has a 13.50 pack-year smoking history. She has never used smokeless tobacco. She reports that she drinks about 2.0 oz of alcohol per week . She reports that she does not use drugs.   Family History  Problem Relation Age of Onset  . Diabetes Mother   . Hypertension Mother   . Heart disease Father   . Hypertension Father   . Heart failure Father      Allergies  Allergen Reactions  . Sulfa Antibiotics Hives and Rash     Prior to Admission medications   Medication Sig Start Date End Date Taking? Authorizing Provider  azithromycin (ZITHROMAX) 250 MG tablet Take 250-500 mg by mouth as directed. Take 2 tablets on Day 1 Then Take 1 tablet BID for Days 2-6. 06/03/16  Yes Historical Provider, MD  BREO ELLIPTA 100-25 MCG/INH AEPB INL 1 PUFF PO ONCE  A DAY 03/25/16  Yes Historical  Provider, MD  Calcium Carbonate-Vitamin D (CALCIUM + D PO) Take by mouth 2 (two) times daily.     Yes Historical Provider, MD  cholecalciferol (VITAMIN D) 1000 UNITS tablet Take 1,000 Units by mouth daily.   Yes Historical Provider, MD  COMBIVENT RESPIMAT 20-100 MCG/ACT AERS respimat Inhale 1 puff into the lungs every 6 (six)  hours as needed for wheezing or shortness of breath.  06/05/16  Yes Historical Provider, MD  losartan-hydrochlorothiazide Konrad Penta) 100-12.5 MG per tablet  07/21/14  Yes Historical Provider, MD  Multiple Vitamins-Minerals (MULTIVITAMIN GUMMIES ADULT PO) Take 1 tablet by mouth daily.    Yes Historical Provider, MD  SPIRIVA HANDIHALER 18 MCG inhalation capsule INL THE CONTENTS OF 1 C VIA HANDIHALER PO ONCE D 05/30/16  Yes Historical Provider, MD  Polyethylene Glycol 3350 (MIRALAX PO) Take 1 packet by mouth daily.     Historical Provider, MD  Tiotropium Bromide Monohydrate (SPIRIVA RESPIMAT) 2.5 MCG/ACT AERS Inhale 1 puff into the lungs daily. Patient not taking: Reported on 06/07/2016 08/08/14   Melvenia Needles, NP    Review of Systems:  Constitutional:  No weight loss, night sweats, Fevers, chills, fatigue.  Head&Eyes: No headache.  No vision loss.  No eye pain or scotoma ENT:  No Difficulty swallowing,Tooth/dental problems,Sore throat,  No ear ache, post nasal drip,  Cardio-vascular:  No chest pain, Orthopnea, PND, swelling in lower extremities,  dizziness, palpitations  GI:  No  abdominal pain, nausea, vomiting, diarrhea, loss of appetite, hematochezia, melena, heartburn, indigestion, Resp:   No coughing up of blood .No wheezing.No chest wall deformity  Skin:  no rash or lesions.  GU:  no dysuria, change in color of urine, no urgency or frequency. No flank pain.  Musculoskeletal:  No joint pain or swelling. No decreased range of motion. No back pain.  Psych:  No change in mood or affect. No depression or anxiety. Neurologic: No headache, no dysesthesia, no focal weakness, no vision loss. No syncope  Physical Exam: Vitals:   06/07/16 1507 06/07/16 1525 06/07/16 1609 06/07/16 1630  BP:  (!) 187/86 (!) 142/83   Pulse: (!) 110 (!) 105 (!) 116 (!) 110  Resp: (!) 28 (!) 31 (!) 23 (!) 36  Temp:      TempSrc:      SpO2: 98% 100% 98% 98%  Weight:      Height:       General:  A&O x 3,  NAD, nontoxic, pleasant/cooperative Head/Eye: No conjunctival hemorrhage, no icterus, [place]/AT, No nystagmus ENT:  No icterus,  No thrush, good dentition, no pharyngeal exudate Neck:  No masses, no lymphadenpathy, no bruits CV:  RRR, no rub, no gallop, no S3 Lung:  Diminished breath sounds bilateral. No wheezing. Bilateral rales. Abdomen: soft/NT, +BS, nondistended, no peritoneal signs Ext: No cyanosis, No rashes, No petechiae, No lymphangitis, No edema Neuro: CNII-XII intact, strength 4/5 in bilateral upper and lower extremities, no dysmetria  Labs on Admission:  Basic Metabolic Panel:  Recent Labs Lab 06/07/16 1528  NA 129*  K 4.1  CL 95*  CO2 25  GLUCOSE 116*  BUN 14  CREATININE 0.67  CALCIUM 9.2   Liver Function Tests:  Recent Labs Lab 06/07/16 1528  AST 34  ALT 24  ALKPHOS 71  BILITOT 0.8  PROT 7.1  ALBUMIN 4.1   No results for input(s): LIPASE, AMYLASE in the last 168 hours. No results for input(s): AMMONIA in the last 168 hours. CBC:  Recent Labs Lab 06/07/16 1528  WBC  13.3*  HGB 13.8  HCT 41.1  MCV 89.2  PLT 315   Coagulation Profile: No results for input(s): INR, PROTIME in the last 168 hours. Cardiac Enzymes: No results for input(s): CKTOTAL, CKMB, CKMBINDEX, TROPONINI in the last 168 hours. BNP: Invalid input(s): POCBNP CBG: No results for input(s): GLUCAP in the last 168 hours. Urine analysis:    Component Value Date/Time   COLORURINE YELLOW 12/20/2013 [place] 12/20/2013 2253   LABSPEC 1.013 12/20/2013 2253   PHURINE 6.5 12/20/2013 2253   GLUCOSEU NEGATIVE 12/20/2013 2253   HGBUR MODERATE (A) 12/20/2013 2253   BILIRUBINUR NEGATIVE 12/20/2013 2253   KETONESUR NEGATIVE 12/20/2013 2253   PROTEINUR NEGATIVE 12/20/2013 2253   UROBILINOGEN 0.2 12/20/2013 2253   NITRITE NEGATIVE 12/20/2013 2253   LEUKOCYTESUR TRACE (A) 12/20/2013 2253   Sepsis Labs: @LABRCNTIP (procalcitonin:4,lacticidven:4) )No results found for this or  any previous visit (from the past 240 hour(s)).   Radiological Exams on Admission: Dg Chest 2 View  Result Date: 06/07/2016 CLINICAL DATA:  Shortness of breath. EXAM: CHEST  2 VIEW COMPARISON:  06/03/2016 FINDINGS: The heart size and mediastinal contours are within normal limits. Lungs are hyperinflated and there are coarsened interstitial markings compatible with COPD. Both lungs are clear. The visualized skeletal structures are unremarkable. IMPRESSION: No active cardiopulmonary disease. Electronically Signed   By: Kerby Moors M.D.   On: 06/07/2016 15:12    EKG: Independently reviewed. Sinus tachycardia, no ST-T wave change    Time spent:60 minutes Code Status:   FULL Family Communication:  No Family at bedside Disposition Plan: expect 2-3 day hospitalization Consults called: none DVT Prophylaxis: [place] Lovenox  Charnese Federici, DO  [facility] Pager [phone]  If 7PM-7AM, please contact night-coverage [email or web address] Password [email or web address] 06/07/2016, 5:22 PM

## 2016-06-07 NOTE — Progress Notes (Signed)
RT unable to obtain ABG.PA aware.

## 2016-06-07 NOTE — ED Notes (Signed)
Patient given water

## 2016-06-07 NOTE — ED Notes (Signed)
Pt attempted to give urine sample, but was unable to.  

## 2016-06-07 NOTE — ED Notes (Signed)
PA and MD at bedside. 

## 2016-06-07 NOTE — ED Notes (Signed)
ED Provider at bedside. 

## 2016-06-07 NOTE — ED Triage Notes (Signed)
Per EMS, patient from PCP for increased SOB x1 week. Patient placed on z-pack Friday after seeing PCP and being dx with bronchitis. Breathing treatment with EMS. Patient has labored breathing with rhonchi in all fields. Hx COPD

## 2016-06-07 NOTE — ED Notes (Signed)
Respiratory called for breathing treatment.

## 2016-06-07 NOTE — ED Provider Notes (Cosign Needed)
ERROR.  [place] was not seen. This chart was open and filled with another patient's notes. Patient name wrong on chart.  Correct name and information of patient that was seen here in the ED was corrected.    [place], [place] 06/07/16 1616

## 2016-06-07 NOTE — Progress Notes (Signed)
Subjective:     Patient ID: Sierra Warner, female   DOB: 03-31-1928, 83 y.o.   MRN: [id number]  HPI    OV 06/07/2016  Chief Complaint  Patient presents with  . Acute Visit    no improvement since Friday seeing TP- pt c/o unchanged prod cough with yellow mucus, sob- feels as she is choking on mucus..    83 year old advanced COPD patient who tries to conserve money intake inhalers and her alternate day fashion. She was seen by nurse practitioner on 06/03/2016 for COPD exacerbation and was discharged on Z-Pak. She says since then his sputum is somewhat improved but she continues to be extreme short of breath with continued worsening cough. Symptoms are rated as severe. She says she does not take prednisone because she does not like it but she will if she has to. She prefers parenteral form of steroids. In the office she was noticed to be extremely tachypneic, grunting unable to complete full sentences but saturating 92% on room air but tachycardic. She is extremely reluctant for admission but finally agreed. We gave her nebulizer  Xopenex and IM steroid 80 mg but even after 20 minutes was no improvement. She lives alone. Admission was recommended she reluctantly agreed with her normal meds a [facility]. Therefore [facility] being called to send her to the emergency department.   has a past medical history of Atrophic vaginitis; COPD, mild ([place]); Hemorrhoids; Microscopic hematuria; and Osteopenia.   reports that she quit smoking about 9 years ago. Her smoking use included Cigarettes. She has a 13.50 pack-year smoking history. She has never used smokeless tobacco.  Past Surgical History:  Procedure Laterality Date  . APPENDECTOMY  1933  . CATARACT SURGERY    . CYSTOSCOPY    . THYROIDECTOMY, PARTIAL    . TONSILLECTOMY      Allergies  Allergen Reactions  . Sulfa Antibiotics Hives and Rash    Immunization History  Administered Date(s) Administered  .  Influenza Split 12/14/2011, 12/18/2012  . Influenza Whole 12/20/2010  . Influenza, High Dose Seasonal PF 12/23/2015  . Influenza,inj,Quad PF,36+ Mos 11/26/2013, 12/20/2014  . Pneumococcal Conjugate-13 02/26/2013  . Pneumococcal Polysaccharide-23 10/13/2014    Family History  Problem Relation Age of Onset  . Diabetes Mother   . Hypertension Mother   . Heart disease Father   . Hypertension Father   . Heart failure Father      Current Outpatient Prescriptions:  .  albuterol-ipratropium (COMBIVENT) 18-103 MCG/ACT inhaler, Inhale 1 puff into the lungs every 6 (six) hours as needed. , Disp: , Rfl:  .  Calcium Carbonate-Vitamin D (CALCIUM + D PO), Take by mouth 2 (two) times daily.  , Disp: , Rfl:  .  cholecalciferol (VITAMIN D) 1000 UNITS tablet, Take 1,000 Units by mouth daily., Disp: , Rfl:  .  losartan-hydrochlorothiazide (HYZAAR) 100-12.5 MG per tablet, , Disp: , Rfl: 1 .  Multiple Vitamins-Minerals (MULTIVITAMIN GUMMIES ADULT PO), Take 1 tablet by mouth daily. , Disp: , Rfl:  .  Polyethylene Glycol 3350 (MIRALAX PO), Take 1 packet by mouth daily. , Disp: , Rfl:  .  Tiotropium Bromide Monohydrate (SPIRIVA RESPIMAT) 2.5 MCG/ACT AERS, Inhale 1 puff into the lungs daily., Disp: 1 Inhaler, Rfl: 3    Review of Systems     Objective:   Physical Exam Vitals:   06/07/16 1215  BP: 128/72  Pulse: (!) 106  SpO2: 94%  Weight: 126 lb (57.2 kg)  Height: 5\' 1"  (  1.549 m)    Estimated body mass index is 23.81 kg/m as calculated from the following:   Height as of this encounter: 5\' 1"  (1.549 m).   Weight as of this encounter: 126 lb (57.2 kg).  Frail elderly female looks markedly different from her baseline Tachypnea at least 25/30 times a minute. Mild use of accessory muscles. Unable to complete full sentences. Grunting  tachycardic normal heart sounds regular rate and rhythm Abdomen soft nontender Extremities no cyanosis no edema No joint deformities The skin is intact in the  exposed areas       Assessment:       ICD-9-CM ICD-10-CM   1. Acute on chronic respiratory failure, unspecified whether with hypoxia or hypercapnia (HCC) 518.84 J96.20        Plan:     Acute on chronic respiratory failure (HCC) Acute on chronic respiratory failure secondary to severe COPD exacerbation. Failing outpatient Z-Pak. No [facility] in the hospital at [facility]. Emergency medical services will be called to transfer to the emergency department. Her car will be remaining at pulmonary office parking lot through the course of admission. She lives alone and admission is definitely recommended     > 50% of this > 40 min visit spent in face to face counseling or/and coordination of care    Dr. Brand Males, M.D., [facility].C.P Pulmonary and Critical Care Medicine Staff Physician [place] [facility] Pager: [phone], If no answer or between  15:00h - 7:00h: call [phone]  06/07/2016 1:22 PM

## 2016-06-08 DIAGNOSIS — I10 Essential (primary) hypertension: Secondary | ICD-10-CM

## 2016-06-08 DIAGNOSIS — J9601 Acute respiratory failure with hypoxia: Secondary | ICD-10-CM

## 2016-06-08 DIAGNOSIS — E871 Hypo-osmolality and hyponatremia: Secondary | ICD-10-CM

## 2016-06-08 DIAGNOSIS — J441 Chronic obstructive pulmonary disease with (acute) exacerbation: Principal | ICD-10-CM

## 2016-06-08 LAB — URINALYSIS, COMPLETE (UACMP) WITH MICROSCOPIC
BILIRUBIN URINE: NEGATIVE
Bacteria, UA: NONE SEEN
Glucose, UA: NEGATIVE mg/dL
KETONES UR: NEGATIVE mg/dL
LEUKOCYTES UA: NEGATIVE
Nitrite: NEGATIVE
PROTEIN: NEGATIVE mg/dL
Specific Gravity, Urine: 1.006 (ref 1.005–1.030)
pH: 6 (ref 5.0–8.0)

## 2016-06-08 LAB — BASIC METABOLIC PANEL
Anion gap: 6 (ref 5–15)
BUN: 14 mg/dL (ref 6–20)
CHLORIDE: 96 mmol/L — AB (ref 101–111)
CO2: 27 mmol/L (ref 22–32)
Calcium: 8.7 mg/dL — ABNORMAL LOW (ref 8.9–10.3)
Creatinine, Ser: 0.59 mg/dL (ref 0.44–1.00)
Glucose, Bld: 148 mg/dL — ABNORMAL HIGH (ref 65–99)
POTASSIUM: 4.5 mmol/L (ref 3.5–5.1)
SODIUM: 129 mmol/L — AB (ref 135–145)

## 2016-06-08 LAB — CBC
HCT: 36.4 % (ref 36.0–46.0)
HEMOGLOBIN: 12 g/dL (ref 12.0–15.0)
MCH: 28.8 pg (ref 26.0–34.0)
MCHC: 33 g/dL (ref 30.0–36.0)
MCV: 87.5 fL (ref 78.0–100.0)
PLATELETS: 264 10*3/uL (ref 150–400)
RBC: 4.16 MIL/uL (ref 3.87–5.11)
RDW: 13.3 % (ref 11.5–15.5)
WBC: 6.9 10*3/uL (ref 4.0–10.5)

## 2016-06-08 LAB — MAGNESIUM: MAGNESIUM: 2.4 mg/dL (ref 1.7–2.4)

## 2016-06-08 LAB — PROCALCITONIN: Procalcitonin: 0.1 ng/mL

## 2016-06-08 MED ORDER — BUDESONIDE 0.5 MG/2ML IN SUSP
0.5000 mg | Freq: Two times a day (BID) | RESPIRATORY_TRACT | Status: DC
Start: 2016-06-09 — End: 2016-06-09
  Administered 2016-06-09: 0.5 mg via RESPIRATORY_TRACT
  Filled 2016-06-08 (×2): qty 2

## 2016-06-08 MED ORDER — HYDRALAZINE HCL 20 MG/ML IJ SOLN: 5.0000 mg | Freq: Once | INTRAMUSCULAR | Status: DC | PRN

## 2016-06-08 MED ORDER — VITAMIN D3 25 MCG (1000 UNIT) PO TABS
1000.0000 [IU] | ORAL_TABLET | Freq: Every day | ORAL | Status: DC
  Administered 2016-06-08 – 2016-06-09 (×2): 1000 [IU] via ORAL
  Filled 2016-06-08 (×2): qty 1

## 2016-06-08 MED ORDER — ONDANSETRON HCL 4 MG PO TABS: 4.0000 mg | ORAL_TABLET | Freq: Four times a day (QID) | ORAL | Status: DC | PRN

## 2016-06-08 MED ORDER — IPRATROPIUM-ALBUTEROL 0.5-2.5 (3) MG/3ML IN SOLN
3.0000 mL | Freq: Three times a day (TID) | RESPIRATORY_TRACT | Status: DC
  Administered 2016-06-09: 3 mL via RESPIRATORY_TRACT
  Filled 2016-06-08: qty 3

## 2016-06-08 MED ORDER — BUDESONIDE 0.5 MG/2ML IN SUSP: 0.5000 mg | Freq: Two times a day (BID) | RESPIRATORY_TRACT | Status: DC

## 2016-06-08 MED ORDER — METHYLPREDNISOLONE SODIUM SUCC 40 MG IJ SOLR
40.0000 mg | Freq: Two times a day (BID) | INTRAMUSCULAR | Status: DC
  Administered 2016-06-09 (×2): 40 mg via INTRAVENOUS
  Filled 2016-06-08 (×2): qty 1

## 2016-06-08 MED ORDER — ENOXAPARIN SODIUM 40 MG/0.4ML ~~LOC~~ SOLN
40.0000 mg | SUBCUTANEOUS | Status: DC
  Administered 2016-06-08: 40 mg via SUBCUTANEOUS
  Filled 2016-06-08 (×2): qty 0.4

## 2016-06-08 MED ORDER — ONDANSETRON HCL 4 MG/2ML IJ SOLN: 4.0000 mg | Freq: Four times a day (QID) | INTRAMUSCULAR | Status: DC | PRN

## 2016-06-08 MED ORDER — AZITHROMYCIN 250 MG PO TABS
250.0000 mg | ORAL_TABLET | Freq: Every day | ORAL | Status: DC
  Administered 2016-06-08 – 2016-06-09 (×2): 250 mg via ORAL
  Filled 2016-06-08 (×2): qty 1

## 2016-06-08 MED ORDER — IPRATROPIUM-ALBUTEROL 0.5-2.5 (3) MG/3ML IN SOLN: 3.0000 mL | Freq: Four times a day (QID) | RESPIRATORY_TRACT | Status: DC

## 2016-06-08 MED ORDER — ACETAMINOPHEN 650 MG RE SUPP: 650.0000 mg | Freq: Four times a day (QID) | RECTAL | Status: DC | PRN

## 2016-06-08 MED ORDER — SODIUM CHLORIDE 0.9% FLUSH
3.0000 mL | Freq: Two times a day (BID) | INTRAVENOUS | Status: DC
  Administered 2016-06-08 (×3): 3 mL via INTRAVENOUS

## 2016-06-08 MED ORDER — LOSARTAN POTASSIUM 50 MG PO TABS
100.0000 mg | ORAL_TABLET | Freq: Every day | ORAL | Status: DC
  Administered 2016-06-08 – 2016-06-09 (×2): 100 mg via ORAL
  Filled 2016-06-08 (×2): qty 2

## 2016-06-08 MED ORDER — POLYETHYLENE GLYCOL 3350 17 G PO PACK
17.0000 g | PACK | Freq: Every day | ORAL | Status: DC
  Administered 2016-06-08 – 2016-06-09 (×2): 17 g via ORAL
  Filled 2016-06-08 (×2): qty 1

## 2016-06-08 MED ORDER — ACETAMINOPHEN 325 MG PO TABS
650.0000 mg | ORAL_TABLET | Freq: Four times a day (QID) | ORAL | Status: DC | PRN
  Administered 2016-06-08: 650 mg via ORAL
  Filled 2016-06-08: qty 2

## 2016-06-08 MED ORDER — LORAZEPAM 1 MG PO TABS
1.0000 mg | ORAL_TABLET | Freq: Three times a day (TID) | ORAL | Status: DC | PRN
  Administered 2016-06-08 – 2016-06-09 (×2): 1 mg via ORAL
  Filled 2016-06-08 (×2): qty 1

## 2016-06-08 MED ORDER — LORAZEPAM 0.5 MG PO TABS
0.5000 mg | ORAL_TABLET | Freq: Once | ORAL | Status: AC
  Administered 2016-06-08: 0.5 mg via ORAL
  Filled 2016-06-08: qty 1

## 2016-06-08 MED ORDER — CALCIUM CARBONATE-VITAMIN D 500-200 MG-UNIT PO TABS
1.0000 | ORAL_TABLET | Freq: Two times a day (BID) | ORAL | Status: DC
  Administered 2016-06-08 – 2016-06-09 (×4): 1 via ORAL
  Filled 2016-06-08 (×4): qty 1

## 2016-06-08 MED ORDER — METHYLPREDNISOLONE SODIUM SUCC 125 MG IJ SOLR
60.0000 mg | Freq: Three times a day (TID) | INTRAMUSCULAR | Status: DC
  Administered 2016-06-08 (×2): 60 mg via INTRAVENOUS
  Filled 2016-06-08 (×3): qty 2

## 2016-06-08 NOTE — Progress Notes (Addendum)
PROGRESS NOTE [facility]   Sierra Warner   EXN:[id number] DOB: [birth year]  DOA: 06/07/2016 PCP: No PCP Per Patient   Brief Narrative:  Sierra Warner is a 83 year old female with medical history of COPD and hypertension presented from her pulmonary office with worsening shortness of breath and coughing. Patient was seen 4 days prior to admission and was diagnosed with COPD exacerbation start azithromycin. Unfortunately patient continue with shortness of breath and was seen by Dr. [name] who noted the patient was tachypneic grunting and oxygen saturation 92% in room air. Patient was sent to the ED for further evaluation. In the ED patient was given 1 hour long albuterol treatment in her respiratory status worsened and therefore she was placed in BiPAP.  Subjective: Patient seen and examined. Sitting up in bed. Patient reported her breathing has improved. Continues to require oxygen 2 L nasal cannula. Nursing staff report desaturation to 89% when patient on room air and with activity. No other complaints. No acute events overnight. Patient remains afebrile.  Assessment & Plan: Acute respiratory failure with hypoxia secondary to COPD exacerbation Continue steroid will taper down. Continue nebulizer -Pulmicort and DuoNeb Continue azithromycin, will check pro-calcitonin  Wean O2 as tolerated Check oxygen saturation with ambulation  Hypertension - `BP stable HCTZ was held due to hyponatremia - this seems to be chronic Continue losartan` Monitor BP   Hyponatremia - chronic dating back to 2015 Asymptomatic - continue to monitor Check BMP in the a.m.  Leukocytosis - likely reactive - resolved, expect wbc to increase due to steroid use.  DVT prophylaxis: Lovenox  Code Status: Full  Family Communication: None at the bedside Disposition Plan: Continue to improve likely to be discharged in the next 24 hours.   Consultants:  None  Procedures:    None  Antimicrobials: None  Objective: Vitals:   06/08/16 0534 06/08/16 0934 06/08/16 1425 06/08/16 1500  BP: 123/60   (!) 146/63  Pulse: 92   (!) 112  Resp: 20   18  Temp: 97.7 F (36.5 C)   98.9 F (37.2 C)  TempSrc: Oral   Oral  SpO2: 99% 94% 97% 99%  Weight:      Height:        Intake/Output Summary (Last 24 hours) at 06/08/16 1528 Last data filed at 06/08/16 0500  Gross per 24 hour  Intake              410 ml  Output                0 ml  Net              410 ml   Filed Weights   06/07/16 1443 06/08/16 0028  Weight: 57.2 kg (126 lb) 57.3 kg (126 lb 6.4 oz)    Examination:  General exam: Appears calm and comfortable  HEENT: AC/AT, PERRLA, OP moist and clear Respiratory system: Good air entry, mild expiratory wheezing throughout. Cardiovascular system: S1 & S2 heard, RRR. No JVD, murmurs, rubs or gallops Gastrointestinal system: Abdomen is nondistended, soft and nontender.  Central nervous system: Alert and oriented.  Extremities: No pedal edema.    Skin: No rashes, lesions or ulcers Psychiatry: Mood & affect appropriate.   Data Reviewed: I have personally reviewed following labs and imaging studies  CBC:  Recent Labs Lab 06/07/16 1528 06/08/16 0553  WBC 13.3* 6.9  HGB 13.8 12.0  HCT 41.1 36.4  MCV 89.2 87.5  PLT 315 496   Basic Metabolic  Panel:  Recent Labs Lab 06/07/16 1528 06/08/16 0553  NA 129* 129*  K 4.1 4.5  CL 95* 96*  CO2 25 27  GLUCOSE 116* 148*  BUN 14 14  CREATININE 0.67 0.59  CALCIUM 9.2 8.7*  MG  --  2.4   GFR: Estimated Creatinine Clearance: 38.8 mL/min (by C-G formula based on SCr of 0.59 mg/dL). Liver Function Tests:  Recent Labs Lab 06/07/16 1528  AST 34  ALT 24  ALKPHOS 71  BILITOT 0.8  PROT 7.1  ALBUMIN 4.1   No results for input(s): LIPASE, AMYLASE in the last 168 hours. No results for input(s): AMMONIA in the last 168 hours. Coagulation Profile: No results for input(s): INR, PROTIME in the last 168  hours. Cardiac Enzymes: No results for input(s): CKTOTAL, CKMB, CKMBINDEX, TROPONINI in the last 168 hours. BNP (last 3 results) No results for input(s): PROBNP in the last 8760 hours. HbA1C: No results for input(s): HGBA1C in the last 72 hours. CBG: No results for input(s): GLUCAP in the last 168 hours. Lipid Profile: No results for input(s): CHOL, HDL, LDLCALC, TRIG, CHOLHDL, LDLDIRECT in the last 72 hours. Thyroid Function Tests: No results for input(s): TSH, T4TOTAL, FREET4, T3FREE, THYROIDAB in the last 72 hours. Anemia Panel: No results for input(s): VITAMINB12, FOLATE, FERRITIN, TIBC, IRON, RETICCTPCT in the last 72 hours. Sepsis Labs: No results for input(s): PROCALCITON, LATICACIDVEN in the last 168 hours.  No results found for this or any previous visit (from the past 240 hour(s)).    Radiology Studies: Dg Chest 2 View  Result Date: 06/07/2016 CLINICAL DATA:  Shortness of breath. EXAM: CHEST  2 VIEW COMPARISON:  06/03/2016 FINDINGS: The heart size and mediastinal contours are within normal limits. Lungs are hyperinflated and there are coarsened interstitial markings compatible with COPD. Both lungs are clear. The visualized skeletal structures are unremarkable. IMPRESSION: No active cardiopulmonary disease. Electronically Signed   By: Kerby Moors M.D.   On: 06/07/2016 15:12    Scheduled Meds: . azithromycin  250 mg Oral Daily  . [START ON 06/09/2016] budesonide (PULMICORT) nebulizer solution  0.5 mg Nebulization BID  . calcium-vitamin D  1 tablet Oral BID  . cholecalciferol  1,000 Units Oral Daily  . enoxaparin (LOVENOX) injection  40 mg Subcutaneous Q24H  . ipratropium-albuterol  3 mL Nebulization Q6H  . losartan  100 mg Oral Daily  . methylPREDNISolone (SOLU-MEDROL) injection  60 mg Intravenous Q8H  . polyethylene glycol  17 g Oral Daily  . sodium chloride flush  3 mL Intravenous Q12H   Continuous Infusions:   LOS: 1 day    Chipper Oman, MD Pager: Text Page  via [email or web address]  [phone]  If 7PM-7AM, please contact night-coverage [email or web address] Password [facility] 06/08/2016, 3:28 PM

## 2016-06-08 NOTE — Progress Notes (Signed)
Nutrition Brief Note  Patient identified on the Malnutrition Screening Tool (MST) Report  Patient's weight is stable.   Wt Readings from Last 15 Encounters:  06/08/16 126 lb 6.4 oz (57.3 kg)  06/07/16 126 lb (57.2 kg)  06/03/16 125 lb 9.6 oz (57 kg)  12/23/15 128 lb 12.8 oz (58.4 kg)  09/21/15 126 lb (57.2 kg)  04/17/15 125 lb 9.6 oz (57 kg)  10/13/14 127 lb (57.6 kg)  08/08/14 125 lb 6.4 oz (56.9 kg)  02/17/14 128 lb (58.1 kg)  12/27/13 127 lb 9.6 oz (57.9 kg)  12/20/13 120 lb (54.4 kg)  11/26/13 127 lb 6.4 oz (57.8 kg)  10/01/13 127 lb (57.6 kg)  06/03/13 126 lb (57.2 kg)  02/26/13 126 lb 12.8 oz (57.5 kg)    Body mass index is 23.88 kg/m. Patient meets criteria for normal based on current BMI.   Current diet order is regular. Labs and medications reviewed.   No nutrition interventions warranted at this time. If nutrition issues arise, please consult RD.   Clayton Bibles, MS, RD, LDN Pager: [phone] After Hours Pager: [phone]

## 2016-06-09 LAB — URINE CULTURE

## 2016-06-09 LAB — COMPREHENSIVE METABOLIC PANEL
ALBUMIN: 3.6 g/dL (ref 3.5–5.0)
ALT: 25 U/L (ref 14–54)
AST: 33 U/L (ref 15–41)
Alkaline Phosphatase: 49 U/L (ref 38–126)
Anion gap: 7 (ref 5–15)
BUN: 21 mg/dL — AB (ref 6–20)
CO2: 28 mmol/L (ref 22–32)
CREATININE: 0.75 mg/dL (ref 0.44–1.00)
Calcium: 9 mg/dL (ref 8.9–10.3)
Chloride: 98 mmol/L — ABNORMAL LOW (ref 101–111)
GFR calc Af Amer: >60 mL/min (ref >60)
GFR calc non Af Amer: >60 mL/min (ref >60)
Glucose, Bld: 118 mg/dL — ABNORMAL HIGH (ref 65–99)
POTASSIUM: 4.7 mmol/L (ref 3.5–5.1)
SODIUM: 133 mmol/L — AB (ref 135–145)
Total Bilirubin: 0.6 mg/dL (ref 0.3–1.2)
Total Protein: 6.5 g/dL (ref 6.5–8.1)

## 2016-06-09 LAB — HEMOGLOBIN A1C
Hgb A1c MFr Bld: 5.6 % (ref 4.8–5.6)
Hgb A1c MFr Bld: 5.7 % — ABNORMAL HIGH (ref 4.8–5.6)
MEAN PLASMA GLUCOSE: 114 mg/dL
Mean Plasma Glucose: 117 mg/dL

## 2016-06-09 MED ORDER — IPRATROPIUM-ALBUTEROL 0.5-2.5 (3) MG/3ML IN SOLN
3.0000 mL | Freq: Once | RESPIRATORY_TRACT | Status: AC
  Administered 2016-06-09: 3 mL via RESPIRATORY_TRACT
  Filled 2016-06-09: qty 3

## 2016-06-09 MED ORDER — PREDNISONE 10 MG PO TABS: ORAL_TABLET | ORAL | 0 refills | Status: DC

## 2016-06-09 MED ORDER — LOSARTAN POTASSIUM 100 MG PO TABS: 100.0000 mg | ORAL_TABLET | Freq: Every day | ORAL | 0 refills | Status: DC

## 2016-06-09 NOTE — Progress Notes (Signed)
When going over discharge instructions, patient was verbally refusing to take BREO inhaler because it "doesn't work". Son is offering to pick up prescriptions and pay for them and she is refusing. Pt also will not verbalize back to this RN on how to take prednisone at home. This RN wrote calendar dates and tablet count on discharge paper to help avoid confusion. Patient was instructed to put tablet count on calendar when she got home. She is refusing help from a case manager or family. Pt verbalized understanding of discharge appointments and instructions.

## 2016-06-09 NOTE — Discharge Summary (Addendum)
Physician Discharge Summary  Sierra Warner  WUJ:[id number]  DOB: [birth year]  DOA: 06/07/2016 PCP: No PCP Per Patient  Admit date: 06/07/2016 Discharge date: 06/09/2016  Admitted From: Home  Disposition:  Home  Recommendations for Outpatient Follow-up:  1. Follow up with PCP in 1-2 weeks 2. Please obtain BMP/CBC in one week  Equipment/Devices: O2 2L Robinson qHS PRN   Discharge Condition: Improve  CODE STATUS: FULL  Diet recommendation: Heart Healthy   Brief/Interim Summary: Sierra Warner is a 83 year old female with medical history of COPD and hypertension presented from her pulmonary office with worsening shortness of breath and coughing. Patient was seen 4 days prior to admission and was diagnosed with COPD exacerbation start azithromycin. Unfortunately patient continue with shortness of breath and was seen by Dr. [name] who noted the patient was tachypneic grunting and oxygen saturation 92% in room air. Patient was sent to the ED for further evaluation. In the ED patient was given 1 hour long albuterol treatment in her respiratory status worsened and therefore she was placed in BiPAP with significant improvement. Patient was placed on IV steroid and nebulizer treatment, clinically improved and will be discharge home Pulmonary follow up.   Subjective: Patient seen and examined. Patient slight anxious today. Breathing significantly improved. Off O2 this morning and saturating in the mid 90's. No acute events over night. Patient report could sleep well. Remains afebrile.   Discharge Diagnoses/Hospital Course:  Acute respiratory failure with hypoxia secondary to COPD exacerbation Initially treated with Solumedrol IV - transitioned to Prednisone PO will give taper for 14 days  Continue home inhalers Treated with Azithromycin for 5 days - can d/c pro-calcitonin 0.10 Off O2 - saturating in the upper 90's O2 saturation was monitor with ambulation and was above 89%  Continue O2 q HS as needed   Follow up with Dr [name] in 1-2 weeks   Hypertension - BP has been stable off HCTZ HCTZ was held due to hyponatremia. - hyponatremia improving  Continue losartan 100 mg daily  Monitor BP   Hyponatremia - chronic dating back to 2015 Asymptomatic - continue to monitor - could be related to HCTZ Improving - monitor BMP in 1 week  HCTZ d/ced   Leukocytosis - likely reactive - resolved, expect wbc to increase due to steroid use.  All other chronic medical condition were stable during the hospitalization.   On the day of the discharge the patient's vitals were stable, and no other acute medical condition were reported by patient. Patient was felt safe to be discharge to home   Discharge Instructions  You were cared for by a hospitalist during your hospital stay. If you have any questions about your discharge medications or the care you received while you were in the hospital after you are discharged, you can call the unit and asked to speak with the hospitalist on call if the hospitalist that took care of you is not available. Once you are discharged, your primary care physician will handle any further medical issues. Please note that NO REFILLS for any discharge medications will be authorized once you are discharged, as it is imperative that you return to your primary care physician (or establish a relationship with a primary care physician if you do not have one) for your aftercare needs so that they can reassess your need for medications and monitor your lab values.  Discharge Instructions    Call MD for:  difficulty breathing, headache or visual disturbances    Complete by:  As directed    Call MD for:  extreme fatigue    Complete by:  As directed    Call MD for:  hives    Complete by:  As directed    Call MD for:  persistant dizziness or light-headedness    Complete by:  As directed    Call MD for:  persistant nausea and vomiting    Complete by:  As directed    Call MD for:   redness, tenderness, or signs of infection (pain, swelling, redness, odor or green/yellow discharge around incision site)    Complete by:  As directed    Call MD for:  severe uncontrolled pain    Complete by:  As directed    Call MD for:  temperature >100.4    Complete by:  As directed    Diet - low sodium heart healthy    Complete by:  As directed    Increase activity slowly    Complete by:  As directed      Allergies as of 06/09/2016      Reactions   Sulfa Antibiotics Hives, Rash      Medication List    STOP taking these medications   azithromycin 250 MG tablet Commonly known as:  ZITHROMAX   losartan-hydrochlorothiazide 100-12.5 MG tablet Commonly known as:  HYZAAR     TAKE these medications   BREO ELLIPTA 100-25 MCG/INH Aepb Generic drug:  fluticasone furoate-vilanterol INL 1 PUFF PO ONCE  A DAY   CALCIUM + D PO Take by mouth 2 (two) times daily.   cholecalciferol 1000 units tablet Commonly known as:  VITAMIN D Take 1,000 Units by mouth daily.   COMBIVENT RESPIMAT 20-100 MCG/ACT Aers respimat Generic drug:  Ipratropium-Albuterol Inhale 1 puff into the lungs every 6 (six) hours as needed for wheezing or shortness of breath.   losartan 100 MG tablet Commonly known as:  COZAAR Take 1 tablet (100 mg total) by mouth daily. Start taking on:  06/10/2016   MIRALAX PO Take 1 packet by mouth daily.   MULTIVITAMIN GUMMIES ADULT PO Take 1 tablet by mouth daily.   predniSONE 10 MG tablet Commonly known as:  DELTASONE Take 4 tablets for 3 days; Take 3 tablets for 4 days; Take 2 tablets for 3 days; Take 1 tablet for 4 days   SPIRIVA HANDIHALER 18 MCG inhalation capsule Generic drug:  tiotropium INL THE CONTENTS OF 1 C VIA HANDIHALER PO ONCE D What changed:  Another medication with the same name was removed. Continue taking this medication, and follow the directions you see here.      Follow-up Information    PCP. Schedule an appointment as soon as possible for a  visit in 1 week(s).        RAMASWAMY,MURALI, MD. Schedule an appointment as soon as possible for a visit in 1 month(s).   Specialty:  Pulmonary Disease Contact information: [place] [address] [phone]          Allergies  Allergen Reactions  . Sulfa Antibiotics Hives and Rash    Consultations:  None    Procedures/Studies: Dg Chest 2 View  Result Date: 06/07/2016 CLINICAL DATA:  Shortness of breath. EXAM: CHEST  2 VIEW COMPARISON:  06/03/2016 FINDINGS: The heart size and mediastinal contours are within normal limits. Lungs are hyperinflated and there are coarsened interstitial markings compatible with COPD. Both lungs are clear. The visualized skeletal structures are unremarkable. IMPRESSION: No active cardiopulmonary disease. Electronically Signed  By: Kerby Moors M.D.   On: 06/07/2016 15:12   Dg Chest 2 View  Result Date: 06/03/2016 CLINICAL DATA:  Cough, chest congestion and shortness of breath for the past 10 days. Ex-smoker. COPD. EXAM: CHEST  2 VIEW COMPARISON:  04/17/2015. FINDINGS: Normal sized heart. Tortuous and calcified thoracic aorta. Clear lungs. The lungs remain mildly hyperexpanded with mildly prominent interstitial markings. Thoracic spine degenerative changes and mild scoliosis. IMPRESSION: No acute abnormality. Stable changes of COPD and aortic atherosclerosis. Electronically Signed   By: Claudie Revering M.D.   On: 06/03/2016 13:00    Discharge Exam: Vitals:   06/09/16 1300   BP: (!) 109/57   Pulse: 98   Resp: 18   Temp: 97.6 F (36.4 C)    Vitals:   06/09/16 0949 06/09/16 0954 06/09/16 1300 06/09/16 1319  BP:   (!) 109/57   Pulse:   98   Resp:   18   Temp:   97.6 F (36.4 C)   TempSrc:   Oral   SpO2: 94% 94% 94% 95%  Weight:      Height:       General: Pt is alert, awake, not in acute distress Cardiovascular: RRR, S1/S2 +, no rubs, no gallops Respiratory: CTA, no wheezing or rales  Abdominal: Soft, NT, ND, bowel sounds  + Extremities: no edema, no cyanosis   The results of significant diagnostics from this hospitalization (including imaging, microbiology, ancillary and laboratory) are listed below for reference.     Microbiology: Recent Results (from the past 240 hour(s))  Culture, Urine     Status: Abnormal   Collection Time: 06/08/16  6:16 AM  Result Value Ref Range Status   Specimen Description URINE, RANDOM  Final   Special Requests NONE  Final   Culture MULTIPLE SPECIES PRESENT, SUGGEST RECOLLECTION (A)  Final   Report Status 06/09/2016 FINAL  Final     Labs: BNP (last 3 results) No results for input(s): BNP in the last 8760 hours. Basic Metabolic Panel:  Recent Labs Lab 06/07/16 1528 06/08/16 0553 06/09/16 0527  NA 129* 129* 133*  K 4.1 4.5 4.7  CL 95* 96* 98*  CO2 25 27 28   GLUCOSE 116* 148* 118*  BUN 14 14 21*  CREATININE 0.67 0.59 0.75  CALCIUM 9.2 8.7* 9.0  MG  --  2.4  --    Liver Function Tests:  Recent Labs Lab 06/07/16 1528 06/09/16 0527  AST 34 33  ALT 24 25  ALKPHOS 71 49  BILITOT 0.8 0.6  PROT 7.1 6.5  ALBUMIN 4.1 3.6   CBC:  Recent Labs Lab 06/07/16 1528 06/08/16 0553  WBC 13.3* 6.9  HGB 13.8 12.0  HCT 41.1 36.4  MCV 89.2 87.5  PLT 315 264   Hgb A1c  Recent Labs  06/08/16 0052 06/08/16 0553  HGBA1C 5.7* 5.6   Anemia work up No results for input(s): VITAMINB12, FOLATE, FERRITIN, TIBC, IRON, RETICCTPCT in the last 72 hours. Urinalysis    Component Value Date/Time   COLORURINE STRAW (A) 06/08/2016 0616   APPEARANCEUR CLEAR 06/08/2016 0616   LABSPEC 1.006 06/08/2016 0616   PHURINE 6.0 06/08/2016 0616   GLUCOSEU NEGATIVE 06/08/2016 0616   HGBUR SMALL (A) 06/08/2016 0616   BILIRUBINUR NEGATIVE 06/08/2016 0616   KETONESUR NEGATIVE 06/08/2016 0616   PROTEINUR NEGATIVE 06/08/2016 0616   UROBILINOGEN 0.2 12/20/2013 2253   NITRITE NEGATIVE 06/08/2016 0616   LEUKOCYTESUR NEGATIVE 06/08/2016 0616   Sepsis Labs Invalid input(s):  PROCALCITONIN,  WBC,  [facility] Recent Results (from the past 240 hour(s))  Culture, Urine     Status: Abnormal   Collection Time: 06/08/16  6:16 AM  Result Value Ref Range Status   Specimen Description URINE, RANDOM  Final   Special Requests NONE  Final   Culture MULTIPLE SPECIES PRESENT, SUGGEST RECOLLECTION (A)  Final   Report Status 06/09/2016 FINAL  Final     Time coordinating discharge: 40 minutes  SIGNED:  Chipper Oman, MD  [facility] 06/09/2016, 3:12 PM  Pager please text page via  [email or web address] Password [email or web address]

## 2016-06-09 NOTE — Progress Notes (Signed)
SATURATION QUALIFICATIONS: (This note is used to comply with regulatory documentation for home oxygen)  Patient Saturations on Room Air at Rest = 98%  Patient Saturations on Room Air while Ambulating = 89%  Pt only needs home O2 at night while sleeping. No oxygen needed while ambulating.

## 2016-06-10 ENCOUNTER — Telehealth: Payer: Self-pay | Admitting: Internal Medicine

## 2016-06-10 ENCOUNTER — Encounter (HOSPITAL_COMMUNITY): Payer: Self-pay | Admitting: Emergency Medicine

## 2016-06-10 NOTE — Telephone Encounter (Signed)
Attempted to contact the pt but the line is busy.  Will call back.

## 2016-06-13 ENCOUNTER — Encounter (HOSPITAL_COMMUNITY): Payer: Self-pay

## 2016-06-13 ENCOUNTER — Emergency Department (HOSPITAL_COMMUNITY): Payer: Medicare Other

## 2016-06-13 ENCOUNTER — Inpatient Hospital Stay (HOSPITAL_COMMUNITY)
Admission: EM | Admit: 2016-06-13 | Discharge: 2016-06-18 | DRG: 190 | Disposition: A | Discharge status: Hospice | Payer: Medicare Other | Attending: Nephrology | Admitting: Nephrology

## 2016-06-13 DIAGNOSIS — F419 Anxiety disorder, unspecified: Secondary | ICD-10-CM | POA: Diagnosis present

## 2016-06-13 DIAGNOSIS — J441 Chronic obstructive pulmonary disease with (acute) exacerbation: Secondary | ICD-10-CM | POA: Diagnosis present

## 2016-06-13 DIAGNOSIS — Z7952 Long term (current) use of systemic steroids: Secondary | ICD-10-CM

## 2016-06-13 DIAGNOSIS — J9601 Acute respiratory failure with hypoxia: Secondary | ICD-10-CM | POA: Diagnosis not present

## 2016-06-13 DIAGNOSIS — R0600 Dyspnea, unspecified: Secondary | ICD-10-CM

## 2016-06-13 DIAGNOSIS — Z7189 Other specified counseling: Secondary | ICD-10-CM

## 2016-06-13 DIAGNOSIS — J9621 Acute and chronic respiratory failure with hypoxia: Secondary | ICD-10-CM | POA: Diagnosis not present

## 2016-06-13 DIAGNOSIS — R0602 Shortness of breath: Secondary | ICD-10-CM | POA: Diagnosis not present

## 2016-06-13 DIAGNOSIS — R0902 Hypoxemia: Secondary | ICD-10-CM | POA: Diagnosis not present

## 2016-06-13 DIAGNOSIS — Z66 Do not resuscitate: Secondary | ICD-10-CM | POA: Diagnosis present

## 2016-06-13 DIAGNOSIS — J9622 Acute and chronic respiratory failure with hypercapnia: Secondary | ICD-10-CM

## 2016-06-13 DIAGNOSIS — Z87891 Personal history of nicotine dependence: Secondary | ICD-10-CM

## 2016-06-13 DIAGNOSIS — I10 Essential (primary) hypertension: Secondary | ICD-10-CM | POA: Diagnosis present

## 2016-06-13 DIAGNOSIS — J449 Chronic obstructive pulmonary disease, unspecified: Secondary | ICD-10-CM | POA: Diagnosis present

## 2016-06-13 DIAGNOSIS — J9620 Acute and chronic respiratory failure, unspecified whether with hypoxia or hypercapnia: Secondary | ICD-10-CM | POA: Diagnosis present

## 2016-06-13 DIAGNOSIS — Z79899 Other long term (current) drug therapy: Secondary | ICD-10-CM

## 2016-06-13 DIAGNOSIS — E871 Hypo-osmolality and hyponatremia: Secondary | ICD-10-CM | POA: Diagnosis not present

## 2016-06-13 DIAGNOSIS — Z9981 Dependence on supplemental oxygen: Secondary | ICD-10-CM

## 2016-06-13 DIAGNOSIS — Z515 Encounter for palliative care: Secondary | ICD-10-CM

## 2016-06-13 DIAGNOSIS — Z8249 Family history of ischemic heart disease and other diseases of the circulatory system: Secondary | ICD-10-CM

## 2016-06-13 DIAGNOSIS — Z882 Allergy status to sulfonamides status: Secondary | ICD-10-CM

## 2016-06-13 LAB — BASIC METABOLIC PANEL
Anion gap: 12 (ref 5–15)
BUN: 15 mg/dL (ref 6–20)
CO2: 33 mmol/L — ABNORMAL HIGH (ref 22–32)
Calcium: 9.3 mg/dL (ref 8.9–10.3)
Chloride: 87 mmol/L — ABNORMAL LOW (ref 101–111)
Creatinine, Ser: 0.78 mg/dL (ref 0.44–1.00)
GFR calc Af Amer: >60 mL/min (ref >60)
GFR calc non Af Amer: >60 mL/min (ref >60)
Glucose, Bld: 135 mg/dL — ABNORMAL HIGH (ref 65–99)
Potassium: 4.4 mmol/L (ref 3.5–5.1)
Sodium: 132 mmol/L — ABNORMAL LOW (ref 135–145)

## 2016-06-13 LAB — CBC WITH DIFFERENTIAL/PLATELET
Basophils Absolute: 0 10*3/uL (ref 0.0–0.1)
Basophils Relative: 0 %
Eosinophils Absolute: 0 10*3/uL (ref 0.0–0.7)
Eosinophils Relative: 0 %
HCT: 42 % (ref 36.0–46.0)
Hemoglobin: 14.4 g/dL (ref 12.0–15.0)
Lymphocytes Relative: 4 %
Lymphs Abs: 0.6 10*3/uL — ABNORMAL LOW (ref 0.7–4.0)
MCH: 30.9 pg (ref 26.0–34.0)
MCHC: 34.3 g/dL (ref 30.0–36.0)
MCV: 90.1 fL (ref 78.0–100.0)
Monocytes Absolute: 0.3 10*3/uL (ref 0.1–1.0)
Monocytes Relative: 2 %
Neutro Abs: 13.4 10*3/uL — ABNORMAL HIGH (ref 1.7–7.7)
Neutrophils Relative %: 94 %
Platelets: 319 10*3/uL (ref 150–400)
RBC: 4.66 MIL/uL (ref 3.87–5.11)
RDW: 13.5 % (ref 11.5–15.5)
WBC: 14.4 10*3/uL — ABNORMAL HIGH (ref 4.0–10.5)

## 2016-06-13 LAB — TROPONIN I: Troponin I: <0.03 ng/mL (ref <0.03)

## 2016-06-13 MED ORDER — IPRATROPIUM BROMIDE 0.02 % IN SOLN
0.5000 mg | Freq: Once | RESPIRATORY_TRACT | Status: AC
  Administered 2016-06-13: 0.5 mg via RESPIRATORY_TRACT
  Filled 2016-06-13: qty 2.5

## 2016-06-13 MED ORDER — METHYLPREDNISOLONE SODIUM SUCC 125 MG IJ SOLR
125.0000 mg | Freq: Once | INTRAMUSCULAR | Status: AC
  Administered 2016-06-13: 125 mg via INTRAVENOUS
  Filled 2016-06-13: qty 2

## 2016-06-13 MED ORDER — LORAZEPAM 2 MG/ML IJ SOLN
0.5000 mg | Freq: Once | INTRAMUSCULAR | Status: AC
  Administered 2016-06-13: 0.5 mg via INTRAVENOUS
  Filled 2016-06-13: qty 1

## 2016-06-13 MED ORDER — SODIUM CHLORIDE 0.9 % IV SOLN: 250.0000 mL | INTRAVENOUS | Status: DC | PRN

## 2016-06-13 MED ORDER — PREDNISONE 20 MG PO TABS
40.0000 mg | ORAL_TABLET | Freq: Every day | ORAL | Status: DC
  Administered 2016-06-14 – 2016-06-16 (×3): 40 mg via ORAL
  Filled 2016-06-13 (×3): qty 2

## 2016-06-13 MED ORDER — ORAL CARE MOUTH RINSE
15.0000 mL | Freq: Two times a day (BID) | OROMUCOSAL | Status: DC
  Administered 2016-06-14 – 2016-06-18 (×9): 15 mL via OROMUCOSAL

## 2016-06-13 MED ORDER — ENOXAPARIN SODIUM 40 MG/0.4ML ~~LOC~~ SOLN
40.0000 mg | SUBCUTANEOUS | Status: DC
  Administered 2016-06-13 – 2016-06-17 (×5): 40 mg via SUBCUTANEOUS
  Filled 2016-06-13 (×5): qty 0.4

## 2016-06-13 MED ORDER — POLYETHYLENE GLYCOL 3350 17 G PO PACK
17.0000 g | PACK | Freq: Every day | ORAL | Status: DC
  Administered 2016-06-13 – 2016-06-18 (×6): 17 g via ORAL
  Filled 2016-06-13 (×7): qty 1

## 2016-06-13 MED ORDER — FLUTICASONE FUROATE-VILANTEROL 100-25 MCG/INH IN AEPB
1.0000 | INHALATION_SPRAY | Freq: Every day | RESPIRATORY_TRACT | Status: DC
  Administered 2016-06-14 – 2016-06-18 (×5): 1 via RESPIRATORY_TRACT
  Filled 2016-06-13: qty 28

## 2016-06-13 MED ORDER — DOXYCYCLINE HYCLATE 100 MG PO TABS
100.0000 mg | ORAL_TABLET | Freq: Two times a day (BID) | ORAL | Status: DC
  Administered 2016-06-13 – 2016-06-18 (×10): 100 mg via ORAL
  Filled 2016-06-13 (×10): qty 1

## 2016-06-13 MED ORDER — IPRATROPIUM-ALBUTEROL 0.5-2.5 (3) MG/3ML IN SOLN
3.0000 mL | Freq: Four times a day (QID) | RESPIRATORY_TRACT | Status: DC
  Administered 2016-06-13 – 2016-06-14 (×3): 3 mL via RESPIRATORY_TRACT
  Filled 2016-06-13 (×3): qty 3

## 2016-06-13 MED ORDER — SODIUM CHLORIDE 0.9% FLUSH
3.0000 mL | Freq: Two times a day (BID) | INTRAVENOUS | Status: DC
  Administered 2016-06-13 – 2016-06-17 (×9): 3 mL via INTRAVENOUS

## 2016-06-13 MED ORDER — MORPHINE SULFATE (PF) 4 MG/ML IV SOLN: 4.0000 mg | Freq: Once | INTRAVENOUS | Status: DC

## 2016-06-13 MED ORDER — ACETAMINOPHEN 650 MG RE SUPP: 650.0000 mg | Freq: Four times a day (QID) | RECTAL | Status: DC | PRN

## 2016-06-13 MED ORDER — ALBUTEROL (5 MG/ML) CONTINUOUS INHALATION SOLN
15.0000 mg/h | INHALATION_SOLUTION | RESPIRATORY_TRACT | Status: DC
  Administered 2016-06-13: 15 mg/h via RESPIRATORY_TRACT
  Filled 2016-06-13: qty 20

## 2016-06-13 MED ORDER — ACETAMINOPHEN 325 MG PO TABS
650.0000 mg | ORAL_TABLET | Freq: Four times a day (QID) | ORAL | Status: DC | PRN
  Administered 2016-06-14 – 2016-06-15 (×2): 650 mg via ORAL
  Filled 2016-06-13 (×2): qty 2

## 2016-06-13 MED ORDER — SODIUM CHLORIDE 0.9% FLUSH: 3.0000 mL | INTRAVENOUS | Status: DC | PRN

## 2016-06-13 MED ORDER — LORAZEPAM 2 MG/ML IJ SOLN
0.7500 mg | Freq: Once | INTRAMUSCULAR | Status: AC
  Administered 2016-06-13: 0.75 mg via INTRAVENOUS
  Filled 2016-06-13: qty 1

## 2016-06-13 MED ORDER — BENZONATATE 100 MG PO CAPS
100.0000 mg | ORAL_CAPSULE | Freq: Three times a day (TID) | ORAL | Status: DC | PRN
  Administered 2016-06-15 (×2): 100 mg via ORAL
  Filled 2016-06-13 (×2): qty 1

## 2016-06-13 MED ORDER — DM-GUAIFENESIN ER 30-600 MG PO TB12
1.0000 | ORAL_TABLET | Freq: Two times a day (BID) | ORAL | Status: DC
  Administered 2016-06-13 – 2016-06-17 (×8): 1 via ORAL
  Filled 2016-06-13 (×8): qty 1

## 2016-06-13 MED ORDER — LOSARTAN POTASSIUM 50 MG PO TABS
100.0000 mg | ORAL_TABLET | Freq: Every day | ORAL | Status: DC
  Administered 2016-06-14 – 2016-06-18 (×5): 100 mg via ORAL
  Filled 2016-06-13 (×5): qty 2

## 2016-06-13 MED ORDER — ALBUTEROL SULFATE (2.5 MG/3ML) 0.083% IN NEBU: 2.5000 mg | INHALATION_SOLUTION | RESPIRATORY_TRACT | Status: DC | PRN

## 2016-06-13 NOTE — ED Notes (Signed)
Called PT son he said he is about an hour to two hours away.

## 2016-06-13 NOTE — ED Notes (Addendum)
Changed pt sheet and applied one chux

## 2016-06-13 NOTE — ED Provider Notes (Signed)
[facility] DEPT Provider Note   CSN: [id number] Arrival date & time: 06/13/16  1157  By signing my name below, I, Sonum Patel, attest that this documentation has been prepared under the direction and in the presence of Virgel Manifold, MD. Electronically Signed: Sonum Patel, Scribe. 06/13/16. 12:25 PM.  History   Chief Complaint No chief complaint on file.   The history is provided by the patient and the EMS personnel. No language interpreter was used.     HPI Comments: Sierra Warner is a 83 y.o. female with past medical history of COPD brought in by ambulance, who presents to the Emergency Department complaining of persistent SOB that worsened this morning. She has associated cough productive of yellow sputum, fever, and generalized pain. She was seen by her pulmonologist on 06/07/16 and was advised to go to the ED that day. She was hospitalized but discharged from [facility] on 06/10/06. She has taken a zpac without relief and was started on prednisone on 06/10/16.   Past Medical History:  Diagnosis Date  . Atrophic vaginitis   . COPD (chronic obstructive pulmonary disease) ([place])   . COPD, mild ([place])   . Hemorrhoids   . Hypertension   . Microscopic hematuria   . Osteopenia     Patient Active Problem List   Diagnosis Date Noted  . Acute respiratory failure with hypoxia ([place]) 06/07/2016  . COPD with acute exacerbation ([place]) 06/07/2016  . Hyponatremia 06/07/2016  . COPD exacerbation ([place])   . Dry skin 04/17/2015  . COPD, severe ([place]) 02/17/2014  . Environmental and seasonal allergies 06/09/2013  . Acute on chronic respiratory failure ([place]) 06/06/2011  . COPD GOLD II with reversibility 04/04/2011  . Hypertension 04/04/2011  . Senile osteoporosis 10/19/2010    Past Surgical History:  Procedure Laterality Date  . APPENDECTOMY  1933  . CATARACT SURGERY    . CYSTOSCOPY    . THYROIDECTOMY, PARTIAL    . TONSILLECTOMY      OB History    Gravida Para Term Preterm AB Living   3 3 3  0  0     SAB TAB Ectopic Multiple Live Births   0 0 0           Home Medications    Prior to Admission medications   Medication Sig Start Date End Date Taking? Authorizing Provider  BREO ELLIPTA 100-25 MCG/INH AEPB INL 1 PUFF PO ONCE  A DAY 03/25/16   Historical Provider, MD  Calcium Carbonate-Vitamin D (CALCIUM + D PO) Take by mouth 2 (two) times daily.      Historical Provider, MD  cholecalciferol (VITAMIN D) 1000 UNITS tablet Take 1,000 Units by mouth daily.    Historical Provider, MD  COMBIVENT RESPIMAT 20-100 MCG/ACT AERS respimat Inhale 1 puff into the lungs every 6 (six) hours as needed for wheezing or shortness of breath.  06/05/16   Historical Provider, MD  losartan (COZAAR) 100 MG tablet Take 1 tablet (100 mg total) by mouth daily. 06/10/16   Doreatha Lew, MD  Multiple Vitamins-Minerals (MULTIVITAMIN GUMMIES ADULT PO) Take 1 tablet by mouth daily.     Historical Provider, MD  Polyethylene Glycol 3350 (MIRALAX PO) Take 1 packet by mouth daily.     Historical Provider, MD  predniSONE (DELTASONE) 10 MG tablet Take 4 tablets for 3 days; Take 3 tablets for 4 days; Take 2 tablets for 3 days; Take 1 tablet for 4 days 06/09/16   Doreatha Lew, MD  [facility] HANDIHALER 18 MCG inhalation  capsule INL THE CONTENTS OF 1 C VIA HANDIHALER PO ONCE D 05/30/16   Historical Provider, MD    Family History Family History  Problem Relation Age of Onset  . Diabetes Mother   . Hypertension Mother   . Heart disease Father   . Hypertension Father   . Heart failure Father     Social History Social History  Substance Use Topics  . Smoking status: Former Smoker    Packs/day: 0.30    Years: 45.00    Types: Cigarettes    Quit date: 03/22/2007  . Smokeless tobacco: Never Used  . Alcohol use 2.0 oz/week    4 Standard drinks or equivalent per week     Allergies   Sulfa antibiotics   Review of Systems Review of Systems  A complete 10 system review of systems was obtained and all systems are  negative except as noted in the HPI and PMH.   Physical Exam Updated Vital Signs There were no vitals taken for this visit.  Physical Exam  Constitutional: She is oriented to person, place, and time. She appears well-developed and well-nourished. No distress.  HENT:  Head: Normocephalic and atraumatic.  Eyes: EOM are normal.  Neck: Normal range of motion.  Cardiovascular: Regular rhythm and normal heart sounds.  Tachycardia present.   Pulmonary/Chest: Accessory muscle usage present. Tachypnea noted. She is in respiratory distress. She has decreased breath sounds.  Can speak in short phrases. Accessory muscle usage. Poor air movement. Diminished breath sounds bilaterally. Tachypneic   Abdominal: Soft. She exhibits no distension. There is no tenderness.  Musculoskeletal: Normal range of motion.  Neurological: She is alert and oriented to person, place, and time.  Skin: Skin is warm and dry.  Psychiatric: She has a normal mood and affect. Judgment normal.  Nursing note and vitals reviewed.  ED Treatments / Results  DIAGNOSTIC STUDIES: Oxygen Saturation is 98% on NRB, normal by my interpretation.    COORDINATION OF CARE: 12:07 PM Discussed treatment plan with pt at bedside and pt agreed to plan.   Labs (all labs ordered are listed, but only abnormal results are displayed) Labs Reviewed  CBC WITH DIFFERENTIAL/PLATELET - Abnormal; Notable for the following:       Result Value   WBC 14.4 (*)    Neutro Abs 13.4 (*)    Lymphs Abs 0.6 (*)    All other components within normal limits  BASIC METABOLIC PANEL - Abnormal; Notable for the following:    Sodium 132 (*)    Chloride 87 (*)    CO2 33 (*)    Glucose, Bld 135 (*)    All other components within normal limits  TROPONIN I    EKG  EKG Interpretation  Date/Time:  Monday June 13 2016 12:06:27 EDT Ventricular Rate:  101 PR Interval:    QRS Duration: 139 QT Interval:  339 QTC Calculation: 440 R Axis:   78 Text  Interpretation:  Sinus tachycardia Nonspecific intraventricular conduction delay Inferior infarct, acute Anterior infarct, acute (LAD) Lateral leads are also involved Artifact in lead(s) I II III aVR aVL aVF V1 V2 EKG similar to 06/07/16 Confirmed by LIU MD, DANA [phone]) on 06/14/2016 1:02:33 PM       Radiology Dg Chest Portable 1 View  Result Date: 06/13/2016 CLINICAL DATA:  Shortness of breath and weakness. EXAM: PORTABLE CHEST 1 VIEW COMPARISON:  PA and lateral chest 06/15/2016, 06/03/2016 and 04/17/2015. FINDINGS: The lungs appear emphysematous. Lung volumes are low with mild basilar atelectasis on  the right. No consolidative process, pneumothorax or pleural effusion. Heart size is normal. Aortic atherosclerosis is noted. IMPRESSION: Mild basilar atelectasis on the right in a low volume chest. The lungs appear emphysematous. Atherosclerosis. Electronically Signed   By: Inge Rise M.D.   On: 06/13/2016 12:37    Procedures Procedures (including critical care time)  Medications Ordered in ED Medications  albuterol (PROVENTIL,VENTOLIN) solution continuous neb (0 mg/hr Nebulization Stopped 06/13/16 1320)  ipratropium (ATROVENT) nebulizer solution 0.5 mg (0.5 mg Nebulization Given 06/13/16 1219)  methylPREDNISolone sodium succinate (SOLU-MEDROL) 125 mg/2 mL injection 125 mg (125 mg Intravenous Given 06/13/16 1216)  LORazepam (ATIVAN) injection 0.75 mg (0.75 mg Intravenous Given 06/13/16 1257)  LORazepam (ATIVAN) injection 0.5 mg (0.5 mg Intravenous Given 06/13/16 1429)     Initial Impression / Assessment and Plan / ED Course  I have reviewed the triage vital signs and the nursing notes.  Pertinent labs & imaging results that were available during my care of the patient were reviewed by me and considered in my medical decision making (see chart for details).   Final Clinical Impressions(s) / ED Diagnoses   Final diagnoses:  COPD exacerbation ([place])   87yF with dyspnea. Likely COPD  exacerbation. Probably some anxiety component on top of it. CXR w/o acute abnormality. She was given an extended NEB and steroids. She is still a little tachypneic, but looks much more comfortable. Oxygen titrated down and low 90s on RA in bed. Got her up to ambulate and sats dropped as low as 82% on RA. Will admit.    New Prescriptions New Prescriptions   No medications on file   I personally preformed the services scribed in my presence. The recorded information has been reviewed is accurate. Virgel Manifold, MD.     Virgel Manifold, MD 06/16/16 [phone]

## 2016-06-13 NOTE — H&P (Signed)
History and Physical   Sierra Warner LPF:[id number] DOB: 27-Jan-1929 DOA: 06/13/2016  Referring MD/NP/PA: Wilson Singer EDP PCP: No PCP Per Patient Outpatient Specialists: [name], pulmonology  Patient coming from: Home  Chief Complaint: Dyspnea, productive cough  HPI: Sierra Warner is a 83 y.o. female with a history of tobacco use and COPD, HTN, and hyponatremia who presented to the ED for evaluation of dyspnea. She reports trouble breathing, specifically getting breath out, worsening, constant for the past 4 - 5 days since discharge from [facility] for COPD exacerbation. She was discharged on prednisone taper and nocturnal oxygen, which she has used, but this dyspnea associated with increased yellow sputum production has become severe. On arrival she was afebrile, tachycardic, and tachypneic, 83% on room air, improved to 98% on NRB. Continuous breathing treatments and IV steroids were provided with incomplete improvement. Due to significant desaturation with exertion, Hospitalists were asked to admit.    Review of Systems: Reports subjective fever/chills without measured temperatures, no orthopnea, PND, leg swelling/pain, and per HPI. All others reviewed and are negative.   Past Medical History:  Diagnosis Date  . Atrophic vaginitis   . COPD (chronic obstructive pulmonary disease) ([place])   . COPD, mild ([place])   . Hemorrhoids   . Hypertension   . Microscopic hematuria   . Osteopenia    Past Surgical History:  Procedure Laterality Date  . APPENDECTOMY  1933  . CATARACT SURGERY    . CYSTOSCOPY    . THYROIDECTOMY, PARTIAL    . TONSILLECTOMY     - Remote smoker, lives along in a townhome has considerable family and friends for support near her.   reports that she quit smoking about 9 years ago. Her smoking use included Cigarettes. She has a 13.50 pack-year smoking history. She has never used smokeless tobacco. She reports that she drinks about 2.0 oz of alcohol per week . She reports that she does not use  drugs. Allergies  Allergen Reactions  . Sulfa Antibiotics Hives and Rash   Family History  Problem Relation Age of Onset  . Diabetes Mother   . Hypertension Mother   . Heart disease Father   . Hypertension Father   . Heart failure Father    - Family history otherwise reviewed and not pertinent. Prior to Admission medications   Medication Sig Start Date End Date Taking? Authorizing Provider  BREO ELLIPTA 100-25 MCG/INH AEPB INL 1 PUFF PO ONCE  A DAY 03/25/16  Yes Historical Provider, MD  Calcium Carbonate-Vitamin D (CALCIUM + D PO) Take by mouth 2 (two) times daily.     Yes Historical Provider, MD  cholecalciferol (VITAMIN D) 1000 UNITS tablet Take 1,000 Units by mouth daily.   Yes Historical Provider, MD  COMBIVENT RESPIMAT 20-100 MCG/ACT AERS respimat Inhale 1 puff into the lungs every 6 (six) hours as needed for wheezing or shortness of breath.  06/05/16  Yes Historical Provider, MD  losartan (COZAAR) 100 MG tablet Take 1 tablet (100 mg total) by mouth daily. 06/10/16  Yes Doreatha Lew, MD  Multiple Vitamins-Minerals (MULTIVITAMIN GUMMIES ADULT PO) Take 1 tablet by mouth daily.    Yes Historical Provider, MD  Polyethylene Glycol 3350 (MIRALAX PO) Take 1 packet by mouth daily.    Yes Historical Provider, MD  predniSONE (DELTASONE) 10 MG tablet Take 4 tablets for 3 days; Take 3 tablets for 4 days; Take 2 tablets for 3 days; Take 1 tablet for 4 days 06/09/16  Yes Doreatha Lew, MD  [facility]  HANDIHALER 18 MCG inhalation capsule INHALE THE CONTENTS OF 1 CAP VIA HANDIHALER PO ONCE DAILY 05/30/16  Yes Historical Provider, MD    Physical Exam: Vitals:   06/13/16 1600 06/13/16 1605 06/13/16 1630 06/13/16 1700  BP: (!) 104/54 117/62 (!) 124/54 137/62  Pulse: 94 (!) 104 96 (!) 102  Resp: (!) 26 (!) 21 (!) 29 (!) 21  Temp:      SpO2: 96% 95% 95% 95%  Weight:      Height:       Constitutional: 83 y.o. female in no distress, mildly anxious demeanor Eyes: Lids and conjunctivae normal,  PERRL ENMT: Mucous membranes are moist. Posterior pharynx clear of any exudate or lesions. Fair dentition.  Neck: normal, supple, no masses, no thyromegaly Respiratory: Increased rate of breathing 2L by [place] without accessory muscle use. Clear, diminished breath sounds to auscultation bilaterally Cardiovascular: Regular rate and rhythm, no murmurs, rubs, or gallops. No carotid bruits. No JVD. No LE edema. 2+ pedal pulses. Abdomen: Normoactive bowel sounds. No tenderness, non-distended, and no masses palpated. No hepatosplenomegaly. GU: No indwelling catheter Musculoskeletal: No clubbing / cyanosis. No joint deformity upper and lower extremities. Good ROM, no contractures. Normal muscle tone.  Skin: Warm, dry. No rashes, wounds, no ulcers. Senile purpura, thin skin.   Neurologic: CN II-XII grossly intact. Gait not assessed. Speech normal with short sentences. No focal deficits in motor strength or sensation in all extremities.  Psychiatric: Alert and oriented x3. Normal judgment and insight. Mood anxious with broad affect.   Labs on Admission: I have personally reviewed following labs and imaging studies  CBC:  Recent Labs Lab 06/07/16 1528 06/08/16 0553 06/13/16 1246  WBC 13.3* 6.9 14.4*  NEUTROABS  --   --  13.4*  HGB 13.8 12.0 14.4  HCT 41.1 36.4 42.0  MCV 89.2 87.5 90.1  PLT 315 264 732   Basic Metabolic Panel:  Recent Labs Lab 06/07/16 1528 06/08/16 0553 06/09/16 0527 06/13/16 1246  NA 129* 129* 133* 132*  K 4.1 4.5 4.7 4.4  CL 95* 96* 98* 87*  CO2 25 27 28  33*  GLUCOSE 116* 148* 118* 135*  BUN 14 14 21* 15  CREATININE 0.67 0.59 0.75 0.78  CALCIUM 9.2 8.7* 9.0 9.3  MG  --  2.4  --   --    GFR: Estimated Creatinine Clearance: 40.7 mL/min (by C-G formula based on SCr of 0.78 mg/dL). Liver Function Tests:  Recent Labs Lab 06/07/16 1528 06/09/16 0527  AST 34 33  ALT 24 25  ALKPHOS 71 49  BILITOT 0.8 0.6  PROT 7.1 6.5  ALBUMIN 4.1 3.6   No results for  input(s): LIPASE, AMYLASE in the last 168 hours. No results for input(s): AMMONIA in the last 168 hours. Coagulation Profile: No results for input(s): INR, PROTIME in the last 168 hours. Cardiac Enzymes:  Recent Labs Lab 06/13/16 1246  TROPONINI <0.03   BNP (last 3 results) No results for input(s): PROBNP in the last 8760 hours. HbA1C: No results for input(s): HGBA1C in the last 72 hours. CBG: No results for input(s): GLUCAP in the last 168 hours. Lipid Profile: No results for input(s): CHOL, HDL, LDLCALC, TRIG, CHOLHDL, LDLDIRECT in the last 72 hours. Thyroid Function Tests: No results for input(s): TSH, T4TOTAL, FREET4, T3FREE, THYROIDAB in the last 72 hours. Anemia Panel: No results for input(s): VITAMINB12, FOLATE, FERRITIN, TIBC, IRON, RETICCTPCT in the last 72 hours. Urine analysis:    Component Value Date/Time   COLORURINE STRAW (A) 06/08/2016  [place] 06/08/2016 0616   LABSPEC 1.006 06/08/2016 0616   PHURINE 6.0 06/08/2016 0616   GLUCOSEU NEGATIVE 06/08/2016 0616   HGBUR SMALL (A) 06/08/2016 0616   BILIRUBINUR NEGATIVE 06/08/2016 0616   KETONESUR NEGATIVE 06/08/2016 0616   PROTEINUR NEGATIVE 06/08/2016 0616   UROBILINOGEN 0.2 12/20/2013 2253   NITRITE NEGATIVE 06/08/2016 0616   LEUKOCYTESUR NEGATIVE 06/08/2016 0616    Recent Results (from the past 240 hour(s))  Culture, Urine     Status: Abnormal   Collection Time: 06/08/16  6:16 AM  Result Value Ref Range Status   Specimen Description URINE, RANDOM  Final   Special Requests NONE  Final   Culture MULTIPLE SPECIES PRESENT, SUGGEST RECOLLECTION (A)  Final   Report Status 06/09/2016 FINAL  Final     Radiological Exams on Admission: Dg Chest Portable 1 View  Result Date: 06/13/2016 CLINICAL DATA:  Shortness of breath and weakness. EXAM: PORTABLE CHEST 1 VIEW COMPARISON:  PA and lateral chest 06/15/2016, 06/03/2016 and 04/17/2015. FINDINGS: The lungs appear emphysematous. Lung volumes are low  with mild basilar atelectasis on the right. No consolidative process, pneumothorax or pleural effusion. Heart size is normal. Aortic atherosclerosis is noted. IMPRESSION: Mild basilar atelectasis on the right in a low volume chest. The lungs appear emphysematous. Atherosclerosis. Electronically Signed   By: Inge Rise M.D.   On: 06/13/2016 12:37    EKG: Independently reviewed. Sinus tachycardia, vent rate 101bpm, early repol most prominent in lateral precordial leads, artifact throughout. IVCD was read by computer interpretation, though QRS does not appear widened. Looks very similar to tracing from 12/20/2013 (only one on file without significant artifact).   Assessment/Plan Principal Problem:   COPD exacerbation (HCC) Active Problems:   COPD GOLD II with reversibility   Hypertension   Acute respiratory failure with hypoxia (HCC)   COPD with acute exacerbation (HCC)   Hyponatremia   Acute hypoxemic respiratory failure due to acute COPD exacerbation: Recurrent responsive to breathing treatments in ED, initially requiring NRB. Currently stable on 2L by nasal cannula, though she does not require this at baseline. Recently discharged on prednisone taper now having worsening sputum, subjective fevers. CXR without focal consolidation or pulmonary edema. Euvolemic. ECG stable and nonischemic.  - Start doxycycline for increased, purulent sputum. Has been on azithromycin.  - Duonebs QID, albuterol q1h prn - Continue prednisone, will require taper given extended duration already. - Follow up with pulmonology, Dr. [name] scheduled in 07/06/2016 at 12:00pm.  - Tessalon prn cough   HTN: Chronic, stable.  - Continue ARB  Hyponatremia: Mild, chronic.  - Monitor  DVT prophylaxis: Lovenox  Code Status: Full confirmed at admission  Family Communication: Many at bedside Disposition Plan: Observation, anticipate dispo to home if stable in AM Consults called: None  Admission status: Observation      Vance Gather, MD [facility] Pager [phone]  If 7PM-7AM, please contact night-coverage [email or web address] Password [email or web address] 06/13/2016, 5:22 PM

## 2016-06-13 NOTE — ED Notes (Signed)
Attempted report 

## 2016-06-13 NOTE — ED Triage Notes (Signed)
Pt from home with Shortness of breath. Pt 83%on room air and 98% on NRB.

## 2016-06-13 NOTE — Progress Notes (Signed)
Pt admitted to the unit in [place] room 34. Pt mental status is A&O x 4. Pt oriented to room, staff, and call bell. Skin is intact except where otherwise charted. Full assessment charted in [facility]. Call bell within reach. Visitor guidelines reviewed w/ pt and/or family.

## 2016-06-13 NOTE — Telephone Encounter (Signed)
lmomtcb x 1 for the pt 

## 2016-06-14 DIAGNOSIS — J9601 Acute respiratory failure with hypoxia: Secondary | ICD-10-CM

## 2016-06-14 DIAGNOSIS — Z882 Allergy status to sulfonamides status: Secondary | ICD-10-CM | POA: Diagnosis not present

## 2016-06-14 DIAGNOSIS — Z66 Do not resuscitate: Secondary | ICD-10-CM | POA: Diagnosis present

## 2016-06-14 DIAGNOSIS — J9622 Acute and chronic respiratory failure with hypercapnia: Secondary | ICD-10-CM | POA: Diagnosis not present

## 2016-06-14 DIAGNOSIS — I10 Essential (primary) hypertension: Secondary | ICD-10-CM | POA: Diagnosis not present

## 2016-06-14 DIAGNOSIS — E871 Hypo-osmolality and hyponatremia: Secondary | ICD-10-CM | POA: Diagnosis not present

## 2016-06-14 DIAGNOSIS — Z515 Encounter for palliative care: Secondary | ICD-10-CM | POA: Diagnosis not present

## 2016-06-14 DIAGNOSIS — Z7952 Long term (current) use of systemic steroids: Secondary | ICD-10-CM | POA: Diagnosis not present

## 2016-06-14 DIAGNOSIS — J9620 Acute and chronic respiratory failure, unspecified whether with hypoxia or hypercapnia: Secondary | ICD-10-CM | POA: Diagnosis not present

## 2016-06-14 DIAGNOSIS — R0600 Dyspnea, unspecified: Secondary | ICD-10-CM | POA: Diagnosis not present

## 2016-06-14 DIAGNOSIS — Z9981 Dependence on supplemental oxygen: Secondary | ICD-10-CM | POA: Diagnosis not present

## 2016-06-14 DIAGNOSIS — R0602 Shortness of breath: Secondary | ICD-10-CM | POA: Diagnosis not present

## 2016-06-14 DIAGNOSIS — J449 Chronic obstructive pulmonary disease, unspecified: Secondary | ICD-10-CM

## 2016-06-14 DIAGNOSIS — Z79899 Other long term (current) drug therapy: Secondary | ICD-10-CM | POA: Diagnosis not present

## 2016-06-14 DIAGNOSIS — J441 Chronic obstructive pulmonary disease with (acute) exacerbation: Secondary | ICD-10-CM | POA: Diagnosis not present

## 2016-06-14 DIAGNOSIS — F419 Anxiety disorder, unspecified: Secondary | ICD-10-CM | POA: Diagnosis present

## 2016-06-14 DIAGNOSIS — J9600 Acute respiratory failure, unspecified whether with hypoxia or hypercapnia: Secondary | ICD-10-CM | POA: Diagnosis not present

## 2016-06-14 DIAGNOSIS — Z87891 Personal history of nicotine dependence: Secondary | ICD-10-CM | POA: Diagnosis not present

## 2016-06-14 DIAGNOSIS — J9621 Acute and chronic respiratory failure with hypoxia: Secondary | ICD-10-CM | POA: Diagnosis present

## 2016-06-14 DIAGNOSIS — Z8249 Family history of ischemic heart disease and other diseases of the circulatory system: Secondary | ICD-10-CM | POA: Diagnosis not present

## 2016-06-14 LAB — CBC
HEMATOCRIT: 37.9 % (ref 36.0–46.0)
Hemoglobin: 13 g/dL (ref 12.0–15.0)
MCH: 30.9 pg (ref 26.0–34.0)
MCHC: 34.3 g/dL (ref 30.0–36.0)
MCV: 90 fL (ref 78.0–100.0)
PLATELETS: 260 10*3/uL (ref 150–400)
RBC: 4.21 MIL/uL (ref 3.87–5.11)
RDW: 13.6 % (ref 11.5–15.5)
WBC: 13.6 10*3/uL — AB (ref 4.0–10.5)

## 2016-06-14 LAB — BASIC METABOLIC PANEL
Anion gap: 10 (ref 5–15)
BUN: 16 mg/dL (ref 6–20)
CO2: 27 mmol/L (ref 22–32)
CREATININE: 0.8 mg/dL (ref 0.44–1.00)
Calcium: 8.9 mg/dL (ref 8.9–10.3)
Chloride: 95 mmol/L — ABNORMAL LOW (ref 101–111)
GFR calc Af Amer: >60 mL/min (ref >60)
Glucose, Bld: 107 mg/dL — ABNORMAL HIGH (ref 65–99)
Potassium: 4.4 mmol/L (ref 3.5–5.1)
SODIUM: 132 mmol/L — AB (ref 135–145)

## 2016-06-14 MED ORDER — IPRATROPIUM-ALBUTEROL 0.5-2.5 (3) MG/3ML IN SOLN
3.0000 mL | Freq: Three times a day (TID) | RESPIRATORY_TRACT | Status: DC
  Administered 2016-06-15 – 2016-06-18 (×11): 3 mL via RESPIRATORY_TRACT
  Filled 2016-06-14 (×11): qty 3

## 2016-06-14 MED ORDER — FUROSEMIDE 10 MG/ML IJ SOLN
20.0000 mg | Freq: Once | INTRAMUSCULAR | Status: AC
  Administered 2016-06-14: 20 mg via INTRAVENOUS
  Filled 2016-06-14: qty 2

## 2016-06-14 MED ORDER — ALBUTEROL SULFATE (2.5 MG/3ML) 0.083% IN NEBU
2.5000 mg | INHALATION_SOLUTION | RESPIRATORY_TRACT | Status: DC | PRN
  Administered 2016-06-15: 2.5 mg via RESPIRATORY_TRACT
  Filled 2016-06-14: qty 3

## 2016-06-14 MED ORDER — FUROSEMIDE 10 MG/ML IJ SOLN
20.0000 mg | Freq: Two times a day (BID) | INTRAMUSCULAR | Status: DC
  Administered 2016-06-14 – 2016-06-15 (×2): 20 mg via INTRAVENOUS
  Filled 2016-06-14 (×2): qty 2

## 2016-06-14 MED ORDER — ALPRAZOLAM 0.25 MG PO TABS
0.2500 mg | ORAL_TABLET | Freq: Two times a day (BID) | ORAL | Status: DC | PRN
Start: 2016-06-14 — End: 2016-06-17
  Administered 2016-06-14 – 2016-06-16 (×2): 0.25 mg via ORAL
  Filled 2016-06-14 (×2): qty 1

## 2016-06-14 MED ORDER — POTASSIUM CHLORIDE CRYS ER 20 MEQ PO TBCR
20.0000 meq | EXTENDED_RELEASE_TABLET | Freq: Every day | ORAL | Status: DC
  Administered 2016-06-14 – 2016-06-18 (×5): 20 meq via ORAL
  Filled 2016-06-14 (×5): qty 1

## 2016-06-14 NOTE — Evaluation (Signed)
Physical Therapy Evaluation Patient Details Name: Sierra Warner MRN: [id number] DOB: 10/23/1928 Today's Date: 06/14/2016   History of Present Illness  83 y.o. female with a history of tobacco use and COPD, HTN, and hyponatremia who presented to the ED for evaluation of dyspnea. She reports trouble breathing, specifically getting breath out, worsening, constant for the past 4 - 5 days since discharge from [facility] for COPD exacerbation.  Clinical Impression  Pt presents with decreased balance and activity tolerance secondary to above. PTA pt was I, however, now requires min guard for mobility. Pt with use of accessory muscles for respiration and educated on breathing technique with use of multimodal cues with no evidence of learning. Recommend d/c home with 24 hour supervision and HHPT for safe transition home. Acute PT will follow.      Follow Up Recommendations Home health PT;Supervision/Assistance - 24 hour    Equipment Recommendations  None recommended by PT    Recommendations for Other Services       Precautions / Restrictions Precautions Precautions: Fall Restrictions Weight Bearing Restrictions: No      Mobility  Bed Mobility Overal bed mobility: Modified Independent             General bed mobility comments: increased time  Transfers Overall transfer level: Needs assistance Equipment used: None Transfers: Sit to/from Stand Sit to Stand: Supervision         General transfer comment: supervision for safety. increased time  Ambulation/Gait Ambulation/Gait assistance: Min guard Ambulation Distance (Feet): 200 Feet Assistive device: None Gait Pattern/deviations: Step-through pattern;Decreased stride length;Trunk flexed Gait velocity: decreased Gait velocity interpretation: Below normal speed for age/gender General Gait Details: slower speed. cues for upright posture  Stairs Stairs: Yes Stairs assistance: Min guard Stair Management: One rail Left;Step to  pattern;Forwards Number of Stairs: 2 General stair comments: min guard for safety. increased time and effort  Wheelchair Mobility    Modified Rankin (Stroke Patients Only)       Balance Overall balance assessment: Needs assistance Sitting-balance support: Feet supported;No upper extremity supported Sitting balance-Leahy Scale: Good     Standing balance support: No upper extremity supported Standing balance-Leahy Scale: Fair                               Pertinent Vitals/Pain Pain Assessment: No/denies pain    Home Living Family/patient expects to be discharged to:: Private residence Living Arrangements: Alone   Type of Home: House Home Access: Stairs to enter Entrance Stairs-Rails: Left Entrance Stairs-Number of Steps: 3 Home Layout: One level Home Equipment: None Additional Comments: per pt and son looking to hire 24 hour assist at home during pt recovery to PLOF. pt on O2 at night.     Prior Function Level of Independence: Independent               Hand Dominance   Dominant Hand: Right    Extremity/Trunk Assessment   Upper Extremity Assessment Upper Extremity Assessment: Generalized weakness    Lower Extremity Assessment Lower Extremity Assessment: Generalized weakness    Cervical / Trunk Assessment Cervical / Trunk Assessment: Kyphotic  Communication   Communication: No difficulties  Cognition Arousal/Alertness: Awake/alert Behavior During Therapy: WFL for tasks assessed/performed Overall Cognitive Status: Within Functional Limits for tasks assessed  General Comments General comments (skin integrity, edema, etc.): Pt with accessory muscle use for respiration and required multimodal cues for breathing technique with no evidence of learning.     Exercises     Assessment/Plan    PT Assessment Patient needs continued PT services  PT Problem List Decreased strength;Decreased  balance;Decreased mobility;Decreased activity tolerance       PT Treatment Interventions Gait training;Stair training;Therapeutic activities;Therapeutic exercise;Balance training;Neuromuscular re-education;Patient/family education    PT Goals (Current goals can be found in the Care Plan section)  Acute Rehab PT Goals Patient Stated Goal: go home PT Goal Formulation: With patient Time For Goal Achievement: 06/28/16 Potential to Achieve Goals: Good Additional Goals Additional Goal #1: Pt will demonstrate and verbalize understanding of pursed lip breathing for improved oxygen saturations.     Frequency Min 3X/week   Barriers to discharge Decreased caregiver support lives alone    Co-evaluation               End of Session Equipment Utilized During Treatment: Gait belt;Oxygen (2L O2 throughout session) Activity Tolerance: Patient tolerated treatment well Patient left: in chair;with call bell/phone within reach Nurse Communication: Mobility status PT Visit Diagnosis: Unsteadiness on feet (R26.81);Difficulty in walking, not elsewhere classified (R26.2);Muscle weakness (generalized) (M62.81)    Time: 9774-1423 PT Time Calculation (min) (ACUTE ONLY): 19 min   Charges:   PT Evaluation $PT Eval Moderate Complexity: 1 Procedure     PT G Codes:   PT G-Codes **NOT FOR INPATIENT CLASS** Functional Assessment Tool Used: Clinical judgement Functional Limitation: Mobility: Walking and moving around Mobility: Walking and Moving Around Current Status (T5320): At least 20 percent but less than 40 percent impaired, limited or restricted Mobility: Walking and Moving Around Goal Status [phone]): At least 1 percent but less than 20 percent impaired, limited or restricted    Tracie Harrier, SPT Acute Rehab SPT [place] 06/14/2016, 1:31 PM

## 2016-06-14 NOTE — Progress Notes (Signed)
  PROGRESS NOTE  Sierra Warner  TDS:[id number] DOB: 07-01-1928 DOA: 06/13/2016 PCP: [facility] Specialists: [name], pulmonology   Brief Narrative: Sierra Warner is a 83 y.o. female with a history of tobacco use and COPD, HTN, and hyponatremia who presented to the ED for evaluation of dyspnea. She reports trouble breathing, specifically getting breath out, worsening, constant for the past 4 - 5 days since discharge from [facility] for COPD exacerbation. She was discharged on prednisone taper and nocturnal oxygen, which she has used, but this dyspnea associated with increased yellow sputum production has become severe. On arrival she was afebrile, tachycardic, and tachypneic, 83% on room air, improved to 98% on NRB. Continuous breathing treatments and IV steroids were provided with incomplete improvement. Due to significant desaturation with exertion, Hospitalists were asked to admit. She has remained stable overnight.   Assessment & Plan: Acute hypoxemic respiratory failure due to acute COPD exacerbation: Recurrent, recently discharged for same, discharged on prednisone taper presents with fevers, worsening sputum. Responsive to breathing treatments in ED, initially requiring NRB. Currently stable on 2L by nasal cannula. CXR without focal consolidation or pulmonary edema. Euvolemic. ECG stable and nonischemic.  - Continue doxycycline (3/26 >> ) Has been on azithromycin without improvement. - Duonebs QID, albuterol q1h prn - Continue prednisone, will require taper given extended duration already. - I've discussed with Dr. Lamonte Sakai for pulmonology consult today. Will defer to their assessment regarding dispo. Has f/u with Dr. [name] scheduled 07/06/2016 at 12:00pm, but may need closer follow up.  - Lives alone, will get PT/OT evaluation today in case of discharge in PM.   HTN: Chronic, stable.  - Continue ARB  Hyponatremia: Mild, chronic. Stable.  - Monitor  DVT prophylaxis: Lovenox Code Status:  Full Family Communication: None at bedside Disposition Plan: [place] home today unless further work up or inpatient treatment decided upon by pulmonology.  Consultants: Pulmonology  Subjective: Pt with stable dyspnea only on exertion currently. Slept well. No chest pain. Wants to go home.   Objective: BP 136/69 (BP Location: Left Arm)   Pulse 90   Temp 97.6 F (36.4 C) (Oral)   Resp 18   Ht 5\' 2"  (1.575 m)   Wt 57.2 kg (126 lb)   SpO2 98%   BMI 23.05 kg/m   General exam: Anxious 83yo F in no distress Respiratory system: Non-labored breathing on 2L by [place], breathless toward the end of longer sentences. Diminished but clear to auscultation bilaterally.  Cardiovascular system: Regular rate and rhythm. No murmur, rub, or gallop. No JVD, and no pedal edema. Gastrointestinal system: Abdomen soft, non-tender, non-distended, with normoactive bowel sounds. No organomegaly or masses felt. Central nervous system: Alert and oriented. No focal neurological deficits. Extremities: Warm, no deformities Skin: No rashes. Diffuse ecchymoses, mostly on forearms. Psychiatry: Judgement and insight appear normal. Mood anxious & affect congruent.  Radiology Studies: Dg Chest Portable 1 View 06/13/2016 Mild basilar atelectasis on the right in a low volume chest. The lungs appear emphysematous. Atherosclerosis.   Vance Gather, MD [facility] Pager [phone]  If 7PM-7AM, please contact night-coverage [email or web address] Password [facility] 06/14/2016, 9:35 AM

## 2016-06-14 NOTE — Consult Note (Signed)
Name: Sierra Warner MRN: [id number] DOB: 1928-04-21    ADMISSION DATE:  06/13/2016 CONSULTATION DATE:  06/13/16  REFERRING MD :  Bonner Puna  CHIEF COMPLAINT:  SOB   HISTORY OF PRESENT ILLNESS:  Sierra Warner is a 83 y.o. female with a PMH as outlined below including nocturnal O2 dependent COPD, followed by Dr. [name].  She presented to ED 3/26 with SOB and productive cough.  Symptoms had been present for 4 - 5 days since discharge from [facility] for AECOPD. She had started having cough with intermittent yellow sputum production.  In ED, was found to have repeat AECOPD and was admitted and treated as such.  In AM 3/27, PCCM was asked to see to assist with disposition.  She is apparently stable for discharge but since she had bounce back so soon after prior discharge, [facility] MD felt that she should be seen by PCCM first.  Of note, she sees Dr. [name] in the office (last visit 06/07/16 and had direct admission at that time). She is on nocturnal O2 daily and is also on Breo and Spiriva, but per office notes, she alternates each every other day in an effort to save money. PFT's from June 2017:  FEV1 0.75 (50% pred), ratio 37% pred post.  PAST MEDICAL HISTORY :   has a past medical history of Atrophic vaginitis; COPD (chronic obstructive pulmonary disease) ([place]); COPD, mild ([place]); Hemorrhoids; Hypertension; Microscopic hematuria; and Osteopenia.  has a past surgical history that includes Appendectomy (2536); Thyroidectomy, partial; CATARACT SURGERY; Tonsillectomy; and Cystoscopy. Prior to Admission medications   Medication Sig Start Date End Date Taking? Authorizing Provider  BREO ELLIPTA 100-25 MCG/INH AEPB INL 1 PUFF PO ONCE  A DAY 03/25/16  Yes Historical Provider, MD  Calcium Carbonate-Vitamin D (CALCIUM + D PO) Take by mouth 2 (two) times daily.     Yes Historical Provider, MD  cholecalciferol (VITAMIN D) 1000 UNITS tablet Take 1,000 Units by mouth daily.   Yes Historical Provider, MD  COMBIVENT RESPIMAT  20-100 MCG/ACT AERS respimat Inhale 1 puff into the lungs every 6 (six) hours as needed for wheezing or shortness of breath.  06/05/16  Yes Historical Provider, MD  losartan (COZAAR) 100 MG tablet Take 1 tablet (100 mg total) by mouth daily. 06/10/16  Yes Doreatha Lew, MD  Multiple Vitamins-Minerals (MULTIVITAMIN GUMMIES ADULT PO) Take 1 tablet by mouth daily.    Yes Historical Provider, MD  Polyethylene Glycol 3350 (MIRALAX PO) Take 1 packet by mouth daily.    Yes Historical Provider, MD  predniSONE (DELTASONE) 10 MG tablet Take 4 tablets for 3 days; Take 3 tablets for 4 days; Take 2 tablets for 3 days; Take 1 tablet for 4 days 06/09/16  Yes Doreatha Lew, MD  SPIRIVA HANDIHALER 18 MCG inhalation capsule INHALE THE CONTENTS OF 1 CAP VIA HANDIHALER PO ONCE DAILY 05/30/16  Yes Historical Provider, MD   Allergies  Allergen Reactions  . Sulfa Antibiotics Hives and Rash    FAMILY HISTORY:  family history includes Diabetes in her mother; Heart disease in her father; Heart failure in her father; Hypertension in her father and mother. SOCIAL HISTORY:  reports that she quit smoking about 9 years ago. Her smoking use included Cigarettes. She has a 13.50 pack-year smoking history. She has never used smokeless tobacco. She reports that she drinks about 2.0 oz of alcohol per week . She reports that she does not use drugs.  REVIEW OF SYSTEMS:   All negative; except for those  that are bolded, which indicate positives.  Constitutional: weight loss, weight gain, night sweats, fevers, chills, fatigue, weakness.  HEENT: headaches, sore throat, sneezing, nasal congestion, post nasal drip, difficulty swallowing, tooth/dental problems, visual complaints, visual changes, ear aches. Neuro: difficulty with speech, weakness, numbness, ataxia. CV:  chest pain, orthopnea, PND, swelling in lower extremities, dizziness, palpitations, syncope.  Resp: cough, hemoptysis, dyspnea, wheezing. GI: heartburn,  indigestion, abdominal pain, nausea, vomiting, diarrhea, constipation, change in bowel habits, loss of appetite, hematemesis, melena, hematochezia.  GU: dysuria, change in color of urine, urgency or frequency, flank pain, hematuria. MSK: joint pain or swelling, decreased range of motion. Psych: change in mood or affect, depression, anxiety, suicidal ideations, homicidal ideations. Skin: rash, itching, bruising.   SUBJECTIVE:  Continues to have dyspnea especially with exertion.  Does have occasional orthopnea but not that frequently.  VITAL SIGNS: Temp:  [97.6 F (36.4 C)-98.8 F (37.1 C)] 97.6 F (36.4 C) (03/27 0555) Pulse Rate:  [90-114] 90 (03/27 0555) Resp:  [15-35] 18 (03/27 0555) BP: (104-160)/(48-78) 136/69 (03/27 0555) SpO2:  [93 %-100 %] 98 % (03/27 0555) Weight:  [57.2 kg (126 lb)] 57.2 kg (126 lb) (03/26 1204)  PHYSICAL EXAMINATION: General: Elderly female, chronically ill appearing, sitting in chair, in NAD. Neuro: A&O x 3, non-focal.  HEENT: Sierra Warner/AT. PERRL, sclerae anicteric. 2L [place] in place. Cardiovascular: RRR, no M/R/G.  Lungs: Respirations even and unlabored.  CTA bilaterally, No W/R/R. Abdomen: BS x 4, soft, NT/ND.  Musculoskeletal: No gross deformities, no edema.  Skin: Intact, warm, no rashes.   Recent Labs Lab 06/09/16 0527 06/13/16 1246 06/14/16 0644  NA 133* 132* 132*  K 4.7 4.4 4.4  CL 98* 87* 95*  CO2 28 33* 27  BUN 21* 15 16  CREATININE 0.75 0.78 0.80  GLUCOSE 118* 135* 107*    Recent Labs Lab 06/08/16 0553 06/13/16 1246 06/14/16 0644  HGB 12.0 14.4 13.0  HCT 36.4 42.0 37.9  WBC 6.9 14.4* 13.6*  PLT 264 319 260   Dg Chest Portable 1 View  Result Date: 06/13/2016 CLINICAL DATA:  Shortness of breath and weakness. EXAM: PORTABLE CHEST 1 VIEW COMPARISON:  PA and lateral chest 06/15/2016, 06/03/2016 and 04/17/2015. FINDINGS: The lungs appear emphysematous. Lung volumes are low with mild basilar atelectasis on the right. No consolidative  process, pneumothorax or pleural effusion. Heart size is normal. Aortic atherosclerosis is noted. IMPRESSION: Mild basilar atelectasis on the right in a low volume chest. The lungs appear emphysematous. Atherosclerosis. Electronically Signed   By: Inge Rise M.D.   On: 06/13/2016 12:37    STUDIES:  CXR 3/26 > mild bibasilar atx.  Pulm vascular congestion.  SIGNIFICANT EVENTS  3/26 > admit. 3/27 > PCCM consult.  ASSESSMENT / PLAN:  AECOPD - this is 2nd admission in past week for same.  She is on nocturnal O2 and breo / spiriva as outpatient; however, she supposedly alternates inhalers every other day in an effort to save money.  In light of her severe COPD (FEV1/FVC 37% and FEV1 0.75), this clearly is not helping her situation.  Furthermore, suspect that she definitely has some component of anxiety that is driving her symptoms. Former smoker. Plan: Continue supplemental O2 as needed to maintain SpO2 > 92%. Recommend ambulatory desat study prior to d/c to assess for home O2 needs. Continue BD's.  I stressed importance of compliance with daily dosing (may need to switch to AirDuo / Atrovent or other more affordable option vs Breo / Spiriva). Continue PO steroids, would  start gradual taper to off. Continue empiric doxy. Continue mucinex. Low dose xanax PRN. CXR intermittently. F/u with pulmonology 1 week after discharge.  ? Volume overload - her CXR appears to have some central vascular congestion and echo from 2017 revealed G1DD with LVEDV 47.  She is also chronically hyponatremic; therefore, suspect she has a component of chronic volume overload. Plan: Would start lasix 20mg  BID for now and assess response. Assess echo. Strict I/O's.  Rest per primary team.  Would not discharge yet until gets echo and volume status optimized.   Montey Hora, [place] [facility] Pager: [phone]  or [phone] 06/14/2016, 11:22 AM  Attending Note:  I  have examined patient, reviewed labs, studies and notes. I have discussed the case with [place], and I agree with the data and plans as amended above.   83 year old woman with a history of very severe obstructive lung disease based on spirometry that I reviewed from 08/28/15. She also has a history of grade 1 diastolic dysfunction on echocardiogram. She is followed in our office by Dr. [name]. She was treated recently for an acute exacerbation with antibiotics and steroids. She has not improved to her usual baseline, is readmitted now for persistent exertional dyspnea. She denies any significant cough, wheeze, chest discomfort or tightness. On my evaluation she is sitting up in a chair, wearing oxygen, is awake alert and interactive. Her lungs are very distant but no wheezes or crackles. Heart is regular, abdominal exam is benign. She does not have any significant peripheral edema. I believe that her current dyspnea is multifactorial, likely due to her recent exacerbation and failure to return to her previous baseline (yet, if ever). Also believe there is a component of mild pulmonary vascular congestion due to her diastolic dysfunction. This is acute on chronic decline and I believe that there is some room to bring about some improvement - she needs better bronchodilator compliance because she currently alternates her Brio and Spiriva due to cost. It may be necessary to change her to an alternative. Also believe she may benefit from some empiric diuresis and we will enact this. Finally she may need oxygen during the day. She needs a walking oximetry and then discussion regarding good compliance with exertion. I do believe that these interventions will help her current symptoms. It must be noted that it's possible that she does not return to her previous baseline. This is commonly seen after an acute exacerbation. We will continue to follow her closely. Of note she has undertaken discussions with her family and w  me regarding CODE STATUS. She would not want to be intubated or to undergo CPR. I can put these orders on the hospital chart. I believe she needs to have a durable DO NOT RESUSCITATE for outpatient.Baltazar Apo, MD, PhD 06/14/2016, 1:33 PM [place] [facility] [phone] or if no answer [phone]

## 2016-06-14 NOTE — Telephone Encounter (Signed)
lmomtcb x 2  

## 2016-06-15 ENCOUNTER — Inpatient Hospital Stay (HOSPITAL_COMMUNITY): Payer: Medicare Other

## 2016-06-15 ENCOUNTER — Telehealth: Payer: Self-pay | Admitting: Internal Medicine

## 2016-06-15 DIAGNOSIS — R0602 Shortness of breath: Secondary | ICD-10-CM

## 2016-06-15 DIAGNOSIS — J9622 Acute and chronic respiratory failure with hypercapnia: Principal | ICD-10-CM

## 2016-06-15 DIAGNOSIS — J9621 Acute and chronic respiratory failure with hypoxia: Secondary | ICD-10-CM

## 2016-06-15 DIAGNOSIS — R0600 Dyspnea, unspecified: Secondary | ICD-10-CM

## 2016-06-15 LAB — ECHOCARDIOGRAM COMPLETE
Height: 62 in
WEIGHTICAEL: 2016 [oz_av]

## 2016-06-15 MED ORDER — MORPHINE SULFATE 10 MG/5ML PO SOLN: 4.0000 mg | Freq: Once | ORAL | Status: DC

## 2016-06-15 MED ORDER — MORPHINE SULFATE (CONCENTRATE) 10 MG/0.5ML PO SOLN
4.0000 mg | Freq: Once | ORAL | Status: AC
  Administered 2016-06-15: 4 mg via ORAL
  Filled 2016-06-15: qty 0.5

## 2016-06-15 MED ORDER — FUROSEMIDE 20 MG PO TABS
20.0000 mg | ORAL_TABLET | Freq: Every day | ORAL | Status: DC
  Administered 2016-06-15 – 2016-06-16 (×2): 20 mg via ORAL
  Filled 2016-06-15 (×2): qty 1

## 2016-06-15 NOTE — Telephone Encounter (Signed)
Spoke with pt's daughter Kennyth Lose (dpr on file), pt is currently admitted and is unsure of her current dx or treatment plan.  Pt's daughter Kennyth Lose is requesting a call back from MR for some clarification.  Tacy Learn [phone]  MR please advise.  Thanks.

## 2016-06-15 NOTE — Assessment & Plan Note (Signed)
Continue abx and sterods - unimproved

## 2016-06-15 NOTE — Telephone Encounter (Signed)
Pt is currently admitted into the hospital.  Will sign off of message.

## 2016-06-15 NOTE — Consult Note (Signed)
Name: Sierra Warner MRN: [id number] DOB: 1928/05/12    ADMISSION DATE:  06/13/2016 CONSULTATION DATE:  06/13/16  REFERRING MD :  Bonner Puna  CHIEF COMPLAINT:  SOB   Sierra Warner is a 83 y.o. female with a PMH as outlined below including nocturnal O2 dependent COPD, followed by Dr. [name].  She presented to ED 3/26 with SOB and productive cough.  Symptoms had been present for 4 - 5 days since discharge from [facility] for AECOPD. She had started having cough with intermittent yellow sputum production.  In ED, was found to have repeat AECOPD and was admitted and treated as such.  In AM 3/27, PCCM was asked to see to assist with disposition.  She is apparently stable for discharge but since she had bounce back so soon after prior discharge, [facility] MD felt that she should be seen by PCCM first.  Of note, she sees Dr. [name] in the office (last visit 06/07/16 and had direct admission at that time). She is on nocturnal O2 daily and is also on Breo and Spiriva, but per office notes, she alternates each every other day in an effort to save money. PFT's from June 2017:  FEV1 0.75 (50% pred), ratio 37% pred post.   SUBJECTIVE/OVERNIGHT/INTERVAL HX 3/28  Readmit immedately after going home. Feels not at bsseline but wants to go home. c/o class 4 dysponea. Now DNR  VITAL SIGNS: Temp:  [97.6 F (36.4 C)-97.7 F (36.5 C)] 97.7 F (36.5 C) (03/28 0454) Pulse Rate:  [82-112] 82 (03/28 0454) Resp:  [0-18] 16 (03/28 0454) BP: (124-145)/(48-66) 145/66 (03/28 0454) SpO2:  [92 %-100 %] 92 % (03/28 0956)    EXAM  General Appearance:    Looks chronically unwell  Head:    Normocephalic, without obvious abnormality, atraumatic  Eyes:    PERRL - yes, conjunctiva/corneas - clear      Ears:    Normal external ear canals, both ears  Nose:   NG tube - no  Throat:  ETT TUBE - no , OG tube - no  Neck:   Supple,  No enlargement/tenderness/nodules     Lungs:     Clear to auscultation bilaterally except occ wheeze  but very labored and significant purse lip - seeoms to be a new baseline past > 1 weeks  Chest wall:    No deformity  Heart:    S1 and S2 normal, no murmur, CVP - no.  Pressors - no  Abdomen:     Soft, no masses, no organomegaly  Genitalia:    Not done  Rectal:   not done  Extremities:   Extremities- intact     Skin:   Intact in exposed areas .      Neurologic:   Sedation - none -> RASS - +1 . Moves all 4s - yes. CAM-ICU - neg . Orientation - x3+       LABS  PULMONARY No results for input(s): PHART, PCO2ART, PO2ART, HCO3, TCO2, O2SAT in the last 168 hours.  Invalid input(s): PCO2, PO2  CBC  Recent Labs Lab 06/13/16 1246 06/14/16 0644  HGB 14.4 13.0  HCT 42.0 37.9  WBC 14.4* 13.6*  PLT 319 260    COAGULATION No results for input(s): INR in the last 168 hours.  CARDIAC   Recent Labs Lab 06/13/16 1246  TROPONINI <0.03   No results for input(s): PROBNP in the last 168 hours.   CHEMISTRY  Recent Labs Lab 06/09/16 0527 06/13/16 1246 06/14/16 0644  NA  133* 132* 132*  K 4.7 4.4 4.4  CL 98* 87* 95*  CO2 28 33* 27  GLUCOSE 118* 135* 107*  BUN 21* 15 16  CREATININE 0.75 0.78 0.80  CALCIUM 9.0 9.3 8.9   Estimated Creatinine Clearance: 40.7 mL/min (by C-G formula based on SCr of 0.8 mg/dL).   LIVER  Recent Labs Lab 06/09/16 0527  AST 33  ALT 25  ALKPHOS 49  BILITOT 0.6  PROT 6.5  ALBUMIN 3.6     INFECTIOUS  Recent Labs Lab 06/08/16 1558  PROCALCITON 0.10     ENDOCRINE CBG (last 3)  No results for input(s): GLUCAP in the last 72 hours.       IMAGING x48h  - image(s) personally visualized  -   highlighted in bold Dg Chest Portable 1 View  Result Date: 06/13/2016 CLINICAL DATA:  Shortness of breath and weakness. EXAM: PORTABLE CHEST 1 VIEW COMPARISON:  PA and lateral chest 06/15/2016, 06/03/2016 and 04/17/2015. FINDINGS: The lungs appear emphysematous. Lung volumes are low with mild basilar atelectasis on the right. No  consolidative process, pneumothorax or pleural effusion. Heart size is normal. Aortic atherosclerosis is noted. IMPRESSION: Mild basilar atelectasis on the right in a low volume chest. The lungs appear emphysematous. Atherosclerosis. Electronically Signed   By: Inge Rise M.D.   On: 06/13/2016 12:37       ASSESSMENT and PLAN  Acute on chronic respiratory failure (HCC) unimprovved  Plan Continue o2 and nebs and sterods  COPD with acute exacerbation (HCC) Continue abx and sterods - unimproved  Dyspnea Class 4 and is refractory despite 2 admits. She is probaly end stage. ANd anxiety Rx not helping  Plan strt oral morphine x 1 dose (risk, benefit and counseled); she is willing to try  COPD, severe ([place]) end stage 4 as of now      FAMILY  - Updates: 06/15/2016 --> patient updated  - Inter-disciplinary family meet or Palliative Care meeting due by:  DAy 7. Current LOS is LOS 1 days  CODE STATUS    Code Status Orders        Start     Ordered   06/14/16 1342  Do not attempt resuscitation (DNR)  Continuous    Question Answer Comment  In the event of cardiac or respiratory ARREST Do not call a "code blue"   In the event of cardiac or respiratory ARREST Do not perform Intubation, CPR, defibrillation or ACLS   In the event of cardiac or respiratory ARREST Use medication by any route, position, wound care, and other measures to relive pain and suffering. May use oxygen, suction and manual treatment of airway obstruction as needed for comfort.      06/14/16 1341    Code Status History    Date Active Date Inactive Code Status Order ID Comments User Context   06/13/2016  4:46 PM 06/14/2016  1:41 PM Full Code [id number]  Patrecia Pour, MD ED   06/08/2016 12:18 AM 06/09/2016  7:29 PM Full Code [id number]  Orson Eva, MD Inpatient    Advance Directive Documentation     Most Recent Value  Type of Advance Directive  Healthcare Power of Attorney  Pre-existing out of facility DNR  order (yellow form or pink MOST form)  -  "MOST" Form in Place?  -        DISPO kep in admission status     Dr. Brand Males, M.D., [facility].C.P Pulmonary and Critical Care Medicine Staff Physician [place]  Pulmonary and Critical Care Pager: [phone], If no answer or between  15:00h - 7:00h: call [phone]  06/15/2016 11:35 AM

## 2016-06-15 NOTE — Progress Notes (Signed)
PROGRESS NOTE    Sierra Warner  SJG:[id number] DOB: 25-Oct-1928 DOA: 06/13/2016 PCP: Eloise Levels, NP   Brief Narrative: 83 y.o.femalewith a history of tobacco use and COPD, HTN, and hyponatremia who presented to the ED for evaluation of dyspnea. She reports trouble breathing, specifically getting breath out, worsening, constant for the past 4 - 5 days since discharge from [facility] for COPD exacerbation. She was discharged on prednisone taper and nocturnal oxygen, which she has used, but this dyspnea associated with increased yellow sputum production has become severe. On arrival she was afebrile, tachycardic, and tachypneic, 83% on room air, improved to 98% on NRB.  Assessment & Plan:  # Acute on chronic hypoxic respiratory failure due to acute COPD exacerbation: - Patient is still short of breath and requiring about 2 L of oxygen. -I'll continue doxycycline, duoneb, prednisone 40 mg orally and Mucinex. Patient needs slow tapering of prednisone on discharge with pulmonary follow-up. -We will do oxygen qualification test with ambulation. Patient likely needs to go home with continuous oxygen. -Pulmonary evaluation appreciated. The patient was placed on Lasix by pulmonary. Echocardiogram with EF of 60-65%. I will discontinue IV Lasix and switched to oral low-dose Lasix daily. Patient does not have CHF. -PT, OT evaluation  #Hypertension: Monitor blood pressure. Currently on Lasix and losartan.  #Hyponatremia: Serum sodium level stable. I'll repeat lab in the morning. Potassium level acceptable.   Principal Problem:   COPD exacerbation ([place]) Active Problems:   COPD GOLD II with reversibility   Hypertension   Acute on chronic respiratory failure (HCC)   COPD, severe (HCC)   COPD with acute exacerbation (HCC)   Hyponatremia   Dyspnea  DVT prophylaxis: Lovenox subcutaneous Code Status: DO NOT RESUSCITATE Family Communication: No family present at bedside Disposition Plan: Likely discharge  home with home care in 1-2 days.    Consultants:   Pulmonary  Procedures: Echo Antimicrobials: Doxycycline  Subjective: Patient was seen and examined at bedside. She was short of breath but reported feeling better than yesterday. Denied chest pain, nausea or vomiting.  Objective: Vitals:   06/14/16 2110 06/15/16 0454 06/15/16 0956 06/15/16 1559  BP: (!) 124/52 (!) 145/66  (!) 138/54  Pulse: (!) 101 82  (!) 103  Resp: 18 16  17   Temp: 97.7 F (36.5 C) 97.7 F (36.5 C)  98.9 F (37.2 C)  TempSrc: Oral Oral    SpO2: 98% 100% 92% 97%  Weight:      Height:        Intake/Output Summary (Last 24 hours) at 06/15/16 1722 Last data filed at 06/15/16 6546  Gross per 24 hour  Intake              243 ml  Output              300 ml  Net              -57 ml   Filed Weights   06/13/16 1204  Weight: 57.2 kg (126 lb)    Examination:  General exam: Appears calm and comfortable  Respiratory system: Bilateral diffuse expiratory wheeze. Mildly increased effort of breathing Cardiovascular system: S1 & S2 heard, RRR.  No pedal edema. Gastrointestinal system: Abdomen is nondistended, soft and nontender. Normal bowel sounds heard. Central nervous system: Alert and oriented. No focal neurological deficits. Extremities: Symmetric 5 x 5 power. Skin: No rashes, lesions or ulcers Psychiatry: Judgement and insight appear normal. Mood & affect appropriate.     Data Reviewed: I  have personally reviewed following labs and imaging studies  CBC:  Recent Labs Lab 06/13/16 1246 06/14/16 0644  WBC 14.4* 13.6*  NEUTROABS 13.4*  --   HGB 14.4 13.0  HCT 42.0 37.9  MCV 90.1 90.0  PLT 319 121   Basic Metabolic Panel:  Recent Labs Lab 06/09/16 0527 06/13/16 1246 06/14/16 0644  NA 133* 132* 132*  K 4.7 4.4 4.4  CL 98* 87* 95*  CO2 28 33* 27  GLUCOSE 118* 135* 107*  BUN 21* 15 16  CREATININE 0.75 0.78 0.80  CALCIUM 9.0 9.3 8.9   GFR: Estimated Creatinine Clearance: 40.7 mL/min  (by C-G formula based on SCr of 0.8 mg/dL). Liver Function Tests:  Recent Labs Lab 06/09/16 0527  AST 33  ALT 25  ALKPHOS 49  BILITOT 0.6  PROT 6.5  ALBUMIN 3.6   No results for input(s): LIPASE, AMYLASE in the last 168 hours. No results for input(s): AMMONIA in the last 168 hours. Coagulation Profile: No results for input(s): INR, PROTIME in the last 168 hours. Cardiac Enzymes:  Recent Labs Lab 06/13/16 1246  TROPONINI <0.03   BNP (last 3 results) No results for input(s): PROBNP in the last 8760 hours. HbA1C: No results for input(s): HGBA1C in the last 72 hours. CBG: No results for input(s): GLUCAP in the last 168 hours. Lipid Profile: No results for input(s): CHOL, HDL, LDLCALC, TRIG, CHOLHDL, LDLDIRECT in the last 72 hours. Thyroid Function Tests: No results for input(s): TSH, T4TOTAL, FREET4, T3FREE, THYROIDAB in the last 72 hours. Anemia Panel: No results for input(s): VITAMINB12, FOLATE, FERRITIN, TIBC, IRON, RETICCTPCT in the last 72 hours. Sepsis Labs: No results for input(s): PROCALCITON, LATICACIDVEN in the last 168 hours.  Recent Results (from the past 240 hour(s))  Culture, Urine     Status: Abnormal   Collection Time: 06/08/16  6:16 AM  Result Value Ref Range Status   Specimen Description URINE, RANDOM  Final   Special Requests NONE  Final   Culture MULTIPLE SPECIES PRESENT, SUGGEST RECOLLECTION (A)  Final   Report Status 06/09/2016 FINAL  Final         Radiology Studies: No results found.      Scheduled Meds: . dextromethorphan-guaiFENesin  1 tablet Oral BID  . doxycycline  100 mg Oral Q12H  . enoxaparin (LOVENOX) injection  40 mg Subcutaneous Q24H  . fluticasone furoate-vilanterol  1 puff Inhalation Daily  . furosemide  20 mg Intravenous Q12H  . ipratropium-albuterol  3 mL Nebulization TID  . losartan  100 mg Oral Daily  . mouth rinse  15 mL Mouth Rinse BID  . polyethylene glycol  17 g Oral Daily  . potassium chloride  20 mEq Oral  Daily  . predniSONE  40 mg Oral Q breakfast  . sodium chloride flush  3 mL Intravenous Q12H   Continuous Infusions:   LOS: 1 day    Khalon Cansler Tanna Furry, MD [facility] Pager [phone]  If 7PM-7AM, please contact night-coverage [email or web address] Password [email or web address] 06/15/2016, 5:22 PM

## 2016-06-15 NOTE — Evaluation (Signed)
Occupational Therapy Evaluation Patient Details Name: Sierra Warner MRN: [id number] DOB: Oct 04, 1928 Today's Date: 06/15/2016    History of Present Illness 83 y.o. female with a history of tobacco use and COPD, HTN, and hyponatremia who presented to the ED for evaluation of dyspnea. She reports trouble breathing, specifically getting breath out, worsening, constant for the past 4 - 5 days since discharge from [facility] for COPD exacerbation.   Clinical Impression   Pt reports she was independent with ADL PTA. Currently pt overall min guard for functional mobility, supervision for ADL, and min assist for LB ADL. Pt presenting with decreased activity tolerance, SOB with activity, impaired standing balance, and deconditioning impacting her independence and safety with ADL and functional mobility. SpO2 >93% on RA throughout session; DOE 3/4. Pt planning to d/c home alone with intermittent supervision from family. Recommending HHOT for follow up to maximize independence and safety with ADL and functional mobility upon return home. Pt would benefit from continued skilled OT to address established goals.    Follow Up Recommendations  Home health OT;Supervision - Intermittent    Equipment Recommendations  None recommended by OT    Recommendations for Other Services       Precautions / Restrictions Precautions Precautions: Fall Precaution Comments: watch O2 Restrictions Weight Bearing Restrictions: No      Mobility Bed Mobility Overal bed mobility: Modified Independent             General bed mobility comments: increased time  Transfers Overall transfer level: Needs assistance Equipment used: None Transfers: Sit to/from Stand Sit to Stand: Supervision         General transfer comment: supervision for safety. increased time    Balance Overall balance assessment: Needs assistance Sitting-balance support: Feet supported;No upper extremity supported Sitting balance-Leahy Scale: Good     Standing balance support: No upper extremity supported;During functional activity Standing balance-Leahy Scale: Fair                             ADL either performed or assessed with clinical judgement   ADL Overall ADL's : Needs assistance/impaired Eating/Feeding: Independent;Sitting   Grooming: Supervision/safety;Sitting   Upper Body Bathing: Supervision/ safety;Sitting   Lower Body Bathing: Minimal assistance;Sit to/from stand   Upper Body Dressing : Supervision/safety;Sitting   Lower Body Dressing: Minimal assistance;Sit to/from stand   Toilet Transfer: Min guard;Ambulation;Regular Glass blower/designer Details (indicate cue type and reason): Simulated by sit to stand from EOB with functional mobility in room         Functional mobility during ADLs: Min guard General ADL Comments: SpO2 >93% on RA throughout activity. DOE 3/4. Educated on breathing strategies.     Vision         Perception     Praxis      Pertinent Vitals/Pain Pain Assessment: No/denies pain     Hand Dominance Right   Extremity/Trunk Assessment Upper Extremity Assessment Upper Extremity Assessment: Overall WFL for tasks assessed   Lower Extremity Assessment Lower Extremity Assessment: Defer to PT evaluation   Cervical / Trunk Assessment Cervical / Trunk Assessment: Kyphotic   Communication Communication Communication: No difficulties   Cognition Arousal/Alertness: Awake/alert Behavior During Therapy: WFL for tasks assessed/performed Overall Cognitive Status: Within Functional Limits for tasks assessed  General Comments       Exercises     Shoulder Instructions      Home Living Family/patient expects to be discharged to:: Private residence Living Arrangements: Alone Available Help at Discharge: Family;Available PRN/intermittently Type of Home: House Home Access: Stairs to enter [employer] of  Steps: 3 Entrance Stairs-Rails: Left Home Layout: Two level;Able to live on main level with bedroom/bathroom     Bathroom Shower/Tub: Occupational psychologist: Standard     Home Equipment: Shower seat - built in          Prior Functioning/Environment Level of Independence: Independent                 OT Problem List: Decreased activity tolerance;Impaired balance (sitting and/or standing);Decreased knowledge of use of DME or AE;Cardiopulmonary status limiting activity      OT Treatment/Interventions: Self-care/ADL training;Energy conservation;DME and/or AE instruction;Therapeutic activities;Patient/family education;Balance training    OT Goals(Current goals can be found in the care plan section) Acute Rehab OT Goals Patient Stated Goal: go home OT Goal Formulation: With patient Time For Goal Achievement: 06/29/16 Potential to Achieve Goals: Good ADL Goals Pt Will Perform Grooming: with modified independence;standing (x3 activities) Pt Will Perform Upper Body Dressing: with modified independence;sitting;standing Pt Will Perform Lower Body Dressing: with modified independence;sit to/from stand Pt Will Transfer to Toilet: with modified independence;ambulating;regular height toilet Pt Will Perform Toileting - Clothing Manipulation and hygiene: with modified independence;sit to/from stand Pt Will Perform Tub/Shower Transfer: Shower transfer;with modified independence;ambulating;shower seat Additional ADL Goal #1: Pt will independently verbalize 3 energy conservation strategies and use during ADL.  OT Frequency: Min 2X/week   Barriers to D/C: Decreased caregiver support  pt lives alone       Co-evaluation              End of Session Equipment Utilized During Treatment: Oxygen Nurse Communication: Mobility status;Other (comment) (SpO2 with activity)  Activity Tolerance: Patient tolerated treatment well Patient left: in bed;with call bell/phone within  reach  OT Visit Diagnosis: Unsteadiness on feet (R26.81)                Time: 6195-0932 OT Time Calculation (min): 17 min Charges:  OT General Charges $OT Visit: 1 Procedure OT Evaluation $OT Eval Moderate Complexity: 1 Procedure G-Codes:     Ardyn Forge A. Ulice Brilliant, M.S., OTR/L Pager: [phone] 06/15/2016, 4:25 PM

## 2016-06-15 NOTE — Care Management Note (Signed)
Case Management Note  Patient Details  Name: Sierra Warner MRN: [id number] Date of Birth: 1928-10-24  Subjective/Objective:       Admitted with COPD exacerbation. From home alone.            PCP: Eloise Levels  Action/Plan: Plan is to d/c to home with home health services when medically stable.  Expected Discharge Date:                  Expected Discharge Plan:  [name]  In-House Referral:     Discharge planning Services  CM Consult  Post Acute Care Choice:    Choice offered to:  Patient  DME Arranged:    DME Agency:     HH Arranged:  PT, RN [place] Agency:  [place] (referral made to [place] @ [phone]  MD orders. CM requested orders from MD.  Status of Service:  Completed, signed off  If discussed at Long Length of Stay Meetings, dates discussed:    Additional Comments:  Sharin Mons, RN 06/15/2016, 3:38 PM

## 2016-06-15 NOTE — Assessment & Plan Note (Addendum)
Class 4 and is refractory despite 2 admits. She is probaly end stage. ANd anxiety Rx not helping. Morho0ne yesterday did not help  Plan repoeat oral morphine 06/16/2016 x 5mg  x 1 dose - she is ol trying again

## 2016-06-15 NOTE — Assessment & Plan Note (Signed)
end stage 4 as of now

## 2016-06-15 NOTE — Consult Note (Signed)
   [facility] CM Inpatient Consult   06/15/2016  Sierra Warner [birth year] [id number]    Patient screened for [facility] Care Management program services as Primary Care was not initially listed. Spoke with patient who indicated she goes to [facility].   Transition of care will be conducted by Primary Care Provider office. Patient's Primary Care MD is Eloise Levels, NP with [facility]. Confirmed with [facility] office as well.  Bedside visit with Sierra Warner reveals that she lives alone and states she usually drives. However, she thinks driving to MD appointments post hospitalization may be challenging for her. States her brother may be able to assist with transportation. Sierra Warner also indicates she will have home health as well.   Potential needs identified are transportation to MD appointments. Will send notification sent to Provider office contact personnel.  Made inpatient RNCM aware of the above. Due to Primary Care office doing transition of care post discharge and the fact that patient states her brother can possibly take her to her MD appointments, [facility] will not follow at this time.  Made inpatient RNCM aware of the above. Please contact for further questions:  Marthenia Rolling, [place], RN,BSN [facility] Liaison [phone]

## 2016-06-15 NOTE — Progress Notes (Signed)
Nutrition Brief Note  RD consulted to assess nutrition requirements/status per COPD GOLD protocol.   Wt Readings from Last 15 Encounters:  06/13/16 126 lb (57.2 kg)  06/08/16 126 lb 6.4 oz (57.3 kg)  06/07/16 126 lb (57.2 kg)  06/03/16 125 lb 9.6 oz (57 kg)  12/23/15 128 lb 12.8 oz (58.4 kg)  09/21/15 126 lb (57.2 kg)  04/17/15 125 lb 9.6 oz (57 kg)  10/13/14 127 lb (57.6 kg)  08/08/14 125 lb 6.4 oz (56.9 kg)  02/17/14 128 lb (58.1 kg)  12/27/13 127 lb 9.6 oz (57.9 kg)  12/20/13 120 lb (54.4 kg)  11/26/13 127 lb 6.4 oz (57.8 kg)  10/01/13 127 lb (57.6 kg)  06/03/13 126 lb (57.2 kg)   Sierra Warner is a 83 y.o. female with a history of tobacco use and COPD, HTN, and hyponatremia who presented to the ED for evaluation of dyspnea.  Spoke with pt, who reports fair appetite. She shares she generally consumes 3 meals per day. Frequent foods are varied and include eggs, yogurt, and belvita biscuits.   Pt reports UBW is around 125#. She reports she weighed around 115# PTA, but reveals "I think my scales are wrong".   Nutrition-Focused physical exam completed. Findings are mild fat depletion, no muscle depletion, and mild edema.   Body mass index is 23.05 kg/m. Patient meets criteria for normal weight range based on current BMI.   Current diet order is Heart Healthy, patient is consuming approximately 100% of meals at this time. Labs and medications reviewed.   No nutrition interventions warranted at this time. If nutrition issues arise, please consult RD.   Hakim Minniefield A. Jimmye Norman, RD, LDN, CDE Pager: [phone] After hours Pager: [phone]

## 2016-06-15 NOTE — Assessment & Plan Note (Addendum)
unimproved  Plan Get CTA chest Continue o2 and nebs and steroids

## 2016-06-16 ENCOUNTER — Inpatient Hospital Stay (HOSPITAL_COMMUNITY): Payer: Medicare Other

## 2016-06-16 ENCOUNTER — Encounter (HOSPITAL_COMMUNITY): Payer: Self-pay | Admitting: Radiology

## 2016-06-16 DIAGNOSIS — Z7189 Other specified counseling: Secondary | ICD-10-CM

## 2016-06-16 LAB — BASIC METABOLIC PANEL
ANION GAP: 8 (ref 5–15)
BUN: 17 mg/dL (ref 6–20)
CHLORIDE: 94 mmol/L — AB (ref 101–111)
CO2: 32 mmol/L (ref 22–32)
Calcium: 8.9 mg/dL (ref 8.9–10.3)
Creatinine, Ser: 0.76 mg/dL (ref 0.44–1.00)
GFR calc Af Amer: >60 mL/min (ref >60)
GFR calc non Af Amer: >60 mL/min (ref >60)
Glucose, Bld: 85 mg/dL (ref 65–99)
POTASSIUM: 4.3 mmol/L (ref 3.5–5.1)
SODIUM: 134 mmol/L — AB (ref 135–145)

## 2016-06-16 MED ORDER — PREDNISONE 20 MG PO TABS
40.0000 mg | ORAL_TABLET | Freq: Every day | ORAL | Status: DC
  Administered 2016-06-17 – 2016-06-18 (×2): 40 mg via ORAL
  Filled 2016-06-16 (×2): qty 2

## 2016-06-16 MED ORDER — MORPHINE SULFATE (CONCENTRATE) 10 MG/0.5ML PO SOLN
5.0000 mg | Freq: Once | ORAL | Status: AC
  Administered 2016-06-16: 5 mg via ORAL
  Filled 2016-06-16: qty 0.5

## 2016-06-16 MED ORDER — IOPAMIDOL (ISOVUE-370) INJECTION 76%
INTRAVENOUS | Status: AC
  Administered 2016-06-16: 100 mL
  Filled 2016-06-16: qty 100

## 2016-06-16 NOTE — Progress Notes (Signed)
PROGRESS NOTE    Sierra Warner  BCW:[id number] DOB: 01/17/1929 DOA: 06/13/2016 PCP: Eloise Levels, NP   Brief Narrative: 83 y.o.femalewith a history of tobacco use and COPD, HTN, and hyponatremia who presented to the ED for evaluation of dyspnea. She reports trouble breathing, specifically getting breath out, worsening, constant for the past 4 - 5 days since discharge from [facility] for COPD exacerbation. She was discharged on prednisone taper and nocturnal oxygen, which she has used, but this dyspnea associated with increased yellow sputum production has become severe. On arrival she was afebrile, tachycardic, and tachypneic, 83% on room air, improved to 98% on NRB.  Assessment & Plan:  # Acute on chronic hypoxic respiratory failure due to acute COPD exacerbation: - Patient is still short of breath and dyspneic, requiring about 2 liters of oxygen.  -continue doxycycline, duoneb, prednisone 40 mg orally and Mucinex.  -discussed with Dr. [name] who recommended palliative care consult for dyspnea and goals of care discussion. Pulmonary ordered oral morphine for the management of dyspnea. Patient likely has end stage COPD. Ordered CT chest. Continue current care.  -We will do oxygen qualification test with ambulation.  -Pulmonary evaluation appreciated. The patient was placed on Lasix by pulmonary. Echocardiogram with EF of 60-65%. Dc lasix as patient is euvolumic and has low BP today. Patient does not have CHF. -PT, OT evaluation. May benefit from home care services.  #Hypertension: Monitor blood pressure. On losartan. Dc lasix. Low BP today  #Hyponatremia: Serum sodium level 134. Potassium level acceptable. Continue to monitor.    Principal Problem:   COPD exacerbation ([place]) Active Problems:   COPD GOLD II with reversibility   Hypertension   Acute on chronic respiratory failure (HCC)   COPD, severe (HCC)   COPD with acute exacerbation (HCC)   Hyponatremia   Dyspnea   Goals of care,  counseling/discussion  DVT prophylaxis: Lovenox subcutaneous Code Status: DO NOT RESUSCITATE Family Communication: No family present at bedside Disposition Plan: Likely discharge home with home care in 1-2 days.  Consultants:   Pulmonary  Procedures: Echo Antimicrobials: Doxycycline  Subjective: Patient was seen and examined at bedside. Patient is still has shortness of breath and dyspnea. Denied chest pain, nausea or vomiting. Objective: Vitals:   06/16/16 1012 06/16/16 1013 06/16/16 1315 06/16/16 1419  BP:  (!) 128/57  (!) 96/58  Pulse:  74  (!) 109  Resp:    18  Temp:    98.7 F (37.1 C)  TempSrc:    Oral  SpO2: 91% 91% 94% 96%  Weight:      Height:        Intake/Output Summary (Last 24 hours) at 06/16/16 1511 Last data filed at 06/16/16 1421  Gross per 24 hour  Intake              360 ml  Output                0 ml  Net              360 ml   Filed Weights   06/13/16 1204  Weight: 57.2 kg (126 lb)    Examination:  General exam: Sitting on bed, able to speak full sentences Respiratory system: Bilateral mild expiratory wheeze, increased respiratory effort Cardiovascular system: S1 & S2 heard, RRR.  No pedal edema. Gastrointestinal system: Abdomen is nondistended, soft and nontender. Normal bowel sounds heard. Central nervous system: Alert and oriented. No focal neurological deficits. Extremities: Symmetric 5 x 5 power. Skin:  No rashes, lesions or ulcers Psychiatry: Judgement and insight appear normal. Mood & affect appropriate.     Data Reviewed: I have personally reviewed following labs and imaging studies  CBC:  Recent Labs Lab 06/13/16 1246 06/14/16 0644  WBC 14.4* 13.6*  NEUTROABS 13.4*  --   HGB 14.4 13.0  HCT 42.0 37.9  MCV 90.1 90.0  PLT 319 144   Basic Metabolic Panel:  Recent Labs Lab 06/13/16 1246 06/14/16 0644 06/16/16 0632  NA 132* 132* 134*  K 4.4 4.4 4.3  CL 87* 95* 94*  CO2 33* 27 32  GLUCOSE 135* 107* 85  BUN 15 16 17     CREATININE 0.78 0.80 0.76  CALCIUM 9.3 8.9 8.9   GFR: Estimated Creatinine Clearance: 40.7 mL/min (by C-G formula based on SCr of 0.76 mg/dL). Liver Function Tests: No results for input(s): AST, ALT, ALKPHOS, BILITOT, PROT, ALBUMIN in the last 168 hours. No results for input(s): LIPASE, AMYLASE in the last 168 hours. No results for input(s): AMMONIA in the last 168 hours. Coagulation Profile: No results for input(s): INR, PROTIME in the last 168 hours. Cardiac Enzymes:  Recent Labs Lab 06/13/16 1246  TROPONINI <0.03   BNP (last 3 results) No results for input(s): PROBNP in the last 8760 hours. HbA1C: No results for input(s): HGBA1C in the last 72 hours. CBG: No results for input(s): GLUCAP in the last 168 hours. Lipid Profile: No results for input(s): CHOL, HDL, LDLCALC, TRIG, CHOLHDL, LDLDIRECT in the last 72 hours. Thyroid Function Tests: No results for input(s): TSH, T4TOTAL, FREET4, T3FREE, THYROIDAB in the last 72 hours. Anemia Panel: No results for input(s): VITAMINB12, FOLATE, FERRITIN, TIBC, IRON, RETICCTPCT in the last 72 hours. Sepsis Labs: No results for input(s): PROCALCITON, LATICACIDVEN in the last 168 hours.  Recent Results (from the past 240 hour(s))  Culture, Urine     Status: Abnormal   Collection Time: 06/08/16  6:16 AM  Result Value Ref Range Status   Specimen Description URINE, RANDOM  Final   Special Requests NONE  Final   Culture MULTIPLE SPECIES PRESENT, SUGGEST RECOLLECTION (A)  Final   Report Status 06/09/2016 FINAL  Final         Radiology Studies: No results found.      Scheduled Meds: . dextromethorphan-guaiFENesin  1 tablet Oral BID  . doxycycline  100 mg Oral Q12H  . enoxaparin (LOVENOX) injection  40 mg Subcutaneous Q24H  . fluticasone furoate-vilanterol  1 puff Inhalation Daily  . furosemide  20 mg Oral Daily  . ipratropium-albuterol  3 mL Nebulization TID  . losartan  100 mg Oral Daily  . mouth rinse  15 mL Mouth Rinse  BID  . morphine CONCENTRATE  5 mg Oral Once  . polyethylene glycol  17 g Oral Daily  . potassium chloride  20 mEq Oral Daily  . predniSONE  40 mg Oral Q breakfast  . sodium chloride flush  3 mL Intravenous Q12H   Continuous Infusions:   LOS: 2 days    Sierra Warner Tanna Furry, MD [facility] Pager [phone]  If 7PM-7AM, please contact night-coverage [email or web address] Password [email or web address] 06/16/2016, 3:11 PM

## 2016-06-16 NOTE — Assessment & Plan Note (Signed)
She has referactory dyspnea with decodnitioning and advanced copd. Eligibile for hospice but she declined given connortation. Advised atleast SNF rehab but declined. Daughter says she is stubborn. Advised atleast home with home PT and home help but after CTA. Daughter is agreeable . Daughter alos thinks talking to palliative care for goals of care and dyspnea relief good idea but will talk to patient to get consent

## 2016-06-16 NOTE — Progress Notes (Signed)
Name: Sierra Warner MRN: [id number] DOB: 26-Aug-1928    ADMISSION DATE:  06/13/2016 CONSULTATION DATE:  06/13/16  REFERRING MD :  Bonner Puna  CHIEF COMPLAINT:  SOB   brief CHARLESTINE ROOKSTOOL is a 83 y.o. female with a PMH as outlined below including nocturnal O2 dependent COPD, followed by Dr. [name].  She presented to ED 3/26 with SOB and productive cough.  Symptoms had been present for 4 - 5 days since discharge from [facility] for AECOPD. She had started having cough with intermittent yellow sputum production.  In ED, was found to have repeat AECOPD and was admitted and treated as such.  In AM 3/27, PCCM was asked to see to assist with disposition.  She is apparently stable for discharge but since she had bounce back so soon after prior discharge, [facility] MD felt that she should be seen by PCCM first.  Of note, she sees Dr. [name] in the office (last visit 06/07/16 and had direct admission at that time). She is on nocturnal O2 daily and is also on Breo and Spiriva, but per office notes, she alternates each every other day in an effort to save money. PFT's from June 2017:  FEV1 0.75 (50% pred), ratio 37% pred post.   3/28  Readmit immedately after going home. Feels not at bsseline but wants to go home. c/o class 4 dysponea. Now DNR   SUBJECTIVE/OVERNIGHT/INTERVAL HX 3/29 - still dyspneic. Morphine didd not help but after much thought she wants to try it again. Also wants to go home alone. Discussed with daugther who is frustrated that patient is very stubborn and not wlling to have goals of care and agrees needs for extra layer of support either SNF rehab or home with PT or home hospice. Patient does not want home hospice   However, admits to fatigue  VITAL SIGNS: Temp:  [97.5 F (36.4 C)-98.9 F (37.2 C)] 97.5 F (36.4 C) (03/29 0531) Pulse Rate:  [74-104] 74 (03/29 1013) Resp:  [17-18] 18 (03/29 0531) BP: (124-143)/(47-57) 128/57 (03/29 1013) SpO2:  [91 %-99 %] 94 % (03/29  1315)    EXAM  General Appearance:    Looks deconditioned. In bed  Head:    Normocephalic, without obvious abnormality, atraumatic  Eyes:    PERRL - yes, conjunctiva/corneas - clear      Ears:    Normal external ear canals, both ears  Nose:   NG tube - no  Throat:  ETT TUBE - no , OG tube - no  Neck:   Supple,  No enlargement/tenderness/nodules     Lungs:     Clear to auscultation bilaterally, LABORD. PURSE LIP +  Chest wall:    No deformity  Heart:    S1 and S2 normal, no murmur, CVP - no.  Pressors - no  Abdomen:     Soft, no masses, no organomegaly  Genitalia:    Not done  Rectal:   not done  Extremities:   Extremities- intactt     Skin:   Intact in exposed areas .      Neurologic:   Sedation - none -> RASS - o . Moves all 4s - yes. CAM-ICU - neg . Orientation - x 3 +       LABS  PULMONARY No results for input(s): PHART, PCO2ART, PO2ART, HCO3, TCO2, O2SAT in the last 168 hours.  Invalid input(s): PCO2, PO2  CBC  Recent Labs Lab 06/13/16 1246 06/14/16 0644  HGB 14.4 13.0  HCT 42.0 37.9  WBC 14.4* 13.6*  PLT 319 260    COAGULATION No results for input(s): INR in the last 168 hours.  CARDIAC   Recent Labs Lab 06/13/16 1246  TROPONINI <0.03   No results for input(s): PROBNP in the last 168 hours.   CHEMISTRY  Recent Labs Lab 06/13/16 1246 06/14/16 0644 06/16/16 0632  NA 132* 132* 134*  K 4.4 4.4 4.3  CL 87* 95* 94*  CO2 33* 27 32  GLUCOSE 135* 107* 85  BUN 15 16 17   CREATININE 0.78 0.80 0.76  CALCIUM 9.3 8.9 8.9   Estimated Creatinine Clearance: 40.7 mL/min (by C-G formula based on SCr of 0.76 mg/dL).   LIVER No results for input(s): AST, ALT, ALKPHOS, BILITOT, PROT, ALBUMIN, INR in the last 168 hours.   INFECTIOUS No results for input(s): LATICACIDVEN, PROCALCITON in the last 168 hours.   ENDOCRINE CBG (last 3)  No results for input(s): GLUCAP in the last 72 hours.       IMAGING x48h  - image(s) personally visualized  -    highlighted in bold No results found.     ASSESSMENT and PLAN  Acute on chronic respiratory failure (HCC) unimproved  Plan Get CTA chest Continue o2 and nebs and steroids  COPD with acute exacerbation ([place]) Continue abx and sterods - unimproved  Dyspnea Class 4 and is refractory despite 2 admits. She is probaly end stage. ANd anxiety Rx not helping. Morho0ne yesterday did not help  Plan repoeat oral morphine 06/16/2016 x 5mg  x 1 dose - she is ol trying again  COPD, severe ([place]) end stage 4 as of now  Goals of care, counseling/discussion She has referactory dyspnea with decodnitioning and advanced copd. Eligibile for hospice but she declined given connortation. Advised atleast SNF rehab but declined. Daughter says she is stubborn. Advised atleast home with home PT and home help but after CTA. Daughter is agreeable . Daughter alos thinks talking to palliative care for goals of care and dyspnea relief good idea but will talk to patient to get consent        FAMILY  - Updates: 06/16/2016 --> patient and duahter separtely  - Inter-disciplinary family meet or Palliative Care meeting due by:  DAy 7. Current LOS is LOS 2 days  CODE STATUS    Code Status Orders        Start     Ordered   06/14/16 1342  Do not attempt resuscitation (DNR)  Continuous    Question Answer Comment  In the event of cardiac or respiratory ARREST Do not call a "code blue"   In the event of cardiac or respiratory ARREST Do not perform Intubation, CPR, defibrillation or ACLS   In the event of cardiac or respiratory ARREST Use medication by any route, position, wound care, and other measures to relive pain and suffering. May use oxygen, suction and manual treatment of airway obstruction as needed for comfort.      06/14/16 1341    Code Status History    Date Active Date Inactive Code Status Order ID Comments User Context   06/13/2016  4:46 PM 06/14/2016  1:41 PM Full Code [id number]  Patrecia Pour,  MD ED   06/08/2016 12:18 AM 06/09/2016  7:29 PM Full Code [id number]  Orson Eva, MD Inpatient    Advance Directive Documentation     Most Recent Value  Type of Advance Directive  Healthcare Power of Attorney  Pre-existing out of facility DNR order (yellow form or pink MOST  form)  -  "MOST" Form in Place?  -        DISPO Keep med surg. No dc 06/16/2016 . Get CTA. Advise pall care consult for goals.   PCCM will seee again 06/20/16 - callf or questinos    Dr. Brand Males, M.D., [facility].C.P Pulmonary and Critical Care Medicine Staff Physician [place] [facility] Pager: [phone], If no answer or between  15:00h - 7:00h: call [phone]  06/16/2016 1:59 PM

## 2016-06-16 NOTE — Progress Notes (Signed)
qPhysical Therapy Treatment Patient Details Name: Sierra Warner MRN: [id number] DOB: October 14, 1928 Today's Date: 06/16/2016    History of Present Illness 83 y.o. female with a history of tobacco use and COPD, HTN, and hyponatremia who presented to the ED for evaluation of dyspnea. She reports trouble breathing, specifically getting breath out, worsening, constant for the past 4 - 5 days since discharge from [facility] for COPD exacerbation.    PT Comments    Pt performed increased gait with cues for pacing and pursed lip breathing.  Pt required x2 seated rest breaks to improve O2 saturations.  Tx performed on RA with saturations ranging from 86%-95%.  Pt educated on use of incentive spirometer and frequency.   Plan next session to issue HEP and review use of incentive spirometer.    Follow Up Recommendations  Home health PT;Supervision/Assistance - 24 hour     Equipment Recommendations  None recommended by PT    Recommendations for Other Services       Precautions / Restrictions Precautions Precautions: Fall Precaution Comments: watch O2 Restrictions Weight Bearing Restrictions: No    Mobility  Bed Mobility               General bed mobility comments: Pt standing in room on arrival.    Transfers Overall transfer level: Modified independent     Sit to Stand: Modified independent (Device/Increase time)         General transfer comment: Pt standing in room on arrival but performed several sit to stand transfers with MOD I.   Ambulation/Gait Ambulation/Gait assistance: Supervision Ambulation Distance (Feet): 100 Feet (x2 + 20 ft)   Gait Pattern/deviations: Shuffle;Step-through pattern;Trunk flexed Gait velocity: decreased Gait velocity interpretation: Below normal speed for age/gender General Gait Details: Pt shuffles feet when walking and requires cues for foot clearance.  Pt also remains to require cues for upper trunk control, pacing and pursed lip breathing.      Stairs Stairs: Yes   Stair Management: One rail Left;Alternating pattern Number of Stairs: 5 General stair comments: Cues for sequencing and safety when descending/ascending stairs.  Pt with noticeable DOE during stair training.  SPO2 on RA decreased to 86%.  Pt required seated rest break after stair training and sats improved to 91% on RA.    Wheelchair Mobility    Modified Rankin (Stroke Patients Only)       Balance     Sitting balance-Leahy Scale: Normal       Standing balance-Leahy Scale: Good                              Cognition Arousal/Alertness: Awake/alert Behavior During Therapy: WFL for tasks assessed/performed Overall Cognitive Status: Within Functional Limits for tasks assessed                                        Exercises Other Exercises Other Exercises: Incentive Spirometer 1x10 reps, quality: 542ml-750ml.  Max VCs for correct technique.  pt educated on frequency to improve lung function.      General Comments        Pertinent Vitals/Pain Pain Assessment: No/denies pain    Home Living                      Prior Function  PT Goals (current goals can now be found in the care plan section) Acute Rehab PT Goals Patient Stated Goal: go home Additional Goals Additional Goal #1: Pt will demonstrate and verbalize understanding of pursed lip breathing for improved oxygen saturations.  Progress towards PT goals: Progressing toward goals    Frequency    Min 3X/week      PT Plan Current plan remains appropriate    Co-evaluation             End of Session Equipment Utilized During Treatment: Gait belt Activity Tolerance: Patient tolerated treatment well Patient left: with call bell/phone within reach;in bed Nurse Communication: Mobility status PT Visit Diagnosis: Unsteadiness on feet (R26.81);Difficulty in walking, not elsewhere classified (R26.2);Muscle weakness (generalized)  (M62.81)     Time: 1194-1740 PT Time Calculation (min) (ACUTE ONLY): 23 min  Charges:  $Gait Training: 8-22 mins $Therapeutic Exercise: 8-22 mins                    G Codes:       Governor Rooks, PTA pager [phone]    Cristela Blue 06/16/2016, 6:52 PM

## 2016-06-16 NOTE — Progress Notes (Signed)
Occupational Therapy Treatment Patient Details Name: Sierra Warner MRN: [id number] DOB: 03-11-1929 Today's Date: 06/16/2016    History of present illness 83 y.o. female with a history of tobacco use and COPD, HTN, and hyponatremia who presented to the ED for evaluation of dyspnea. She reports trouble breathing, specifically getting breath out, worsening, constant for the past 4 - 5 days since discharge from [facility] for COPD exacerbation.   OT comments  Pt progressing towards acute OT goals. Focus of session was grooming tasks standing at sink and education on energy conservation strategies for ADLs. Pt refusing supplemental O2 while at sink but agreeable to reapply once back in bed. Dyspnea noted. Pt verbalizing internal frustrations at overall situation and needing additional supports at home, using supplemental O2, etc. D/c plan remains appropriate.    Follow Up Recommendations  Home health OT;Supervision - Intermittent    Equipment Recommendations  None recommended by OT    Recommendations for Other Services      Precautions / Restrictions Precautions Precautions: Fall Precaution Comments: watch O2 Restrictions Weight Bearing Restrictions: No       Mobility Bed Mobility                  Transfers Overall transfer level: Needs assistance Equipment used: None Transfers: Sit to/from Stand Sit to Stand: Supervision              Balance                                           ADL either performed or assessed with clinical judgement   ADL Overall ADL's : Needs assistance/impaired     Grooming: Supervision/safety;Standing;Wash/dry face;Wash/dry hands;Oral care Grooming Details (indicate cue type and reason): Pt utilizing sink for external support. Dyspnea noted. Pt refusing [place] during OOB/mobility.                  Toilet Transfer: Supervision/safety;Ambulation;Min guard;BSC           Functional mobility during ADLs: Min  guard General ADL Comments: Educated on energy conservation strategies for ADL management. Discussed breathing strategies and to follow MD recommendation regarding supplemental O2 at home. Pt initially saying she would not use O2 at home but at end of session verbalized she would be agreeable to wearable supplemental O2. "The kind you put over your shoulder."     Vision       Perception     Praxis      Cognition Arousal/Alertness: Awake/alert Behavior During Therapy: WFL for tasks assessed/performed Overall Cognitive Status: Within Functional Limits for tasks assessed                                          Exercises     Shoulder Instructions       General Comments      Pertinent Vitals/ Pain       Pain Assessment: No/denies pain  Home Living                                          Prior Functioning/Environment              Frequency  Min 2X/week  Progress Toward Goals  OT Goals(current goals can now be found in the care plan section)     Acute Rehab OT Goals Patient Stated Goal: go home OT Goal Formulation: With patient Time For Goal Achievement: 06/29/16 Potential to Achieve Goals: Good ADL Goals Pt Will Perform Grooming: with modified independence;standing Pt Will Perform Upper Body Dressing: with modified independence;sitting;standing Pt Will Perform Lower Body Dressing: with modified independence;sit to/from stand Pt Will Transfer to Toilet: with modified independence;ambulating;regular height toilet Pt Will Perform Toileting - Clothing Manipulation and hygiene: with modified independence;sit to/from stand Pt Will Perform Tub/Shower Transfer: Shower transfer;with modified independence;ambulating;shower seat Additional ADL Goal #1: Pt will independently verbalize 3 energy conservation strategies and use during ADL.  Plan Discharge plan remains appropriate    Co-evaluation                 End of  Session Equipment Utilized During Treatment: Oxygen  OT Visit Diagnosis: Unsteadiness on feet (R26.81)   Activity Tolerance Patient tolerated treatment well   Patient Left in bed;with call bell/phone within reach   Nurse Communication          Time: 6812-7517 OT Time Calculation (min): 16 min  Charges: OT General Charges $OT Visit: 1 Procedure OT Treatments $Self Care/Home Management : 8-22 mins     Sierra Warner 06/16/2016, 2:18 PM

## 2016-06-17 DIAGNOSIS — Z515 Encounter for palliative care: Secondary | ICD-10-CM

## 2016-06-17 DIAGNOSIS — J9622 Acute and chronic respiratory failure with hypercapnia: Secondary | ICD-10-CM

## 2016-06-17 MED ORDER — HYDROCODONE-HOMATROPINE 5-1.5 MG/5ML PO SYRP: 5.0000 mL | ORAL_SOLUTION | Freq: Four times a day (QID) | ORAL | Status: DC | PRN

## 2016-06-17 MED ORDER — GUAIFENESIN ER 600 MG PO TB12
600.0000 mg | ORAL_TABLET | Freq: Two times a day (BID) | ORAL | Status: DC
  Administered 2016-06-17 – 2016-06-18 (×3): 600 mg via ORAL
  Filled 2016-06-17 (×3): qty 1

## 2016-06-17 MED ORDER — ALPRAZOLAM 0.25 MG PO TABS
0.2500 mg | ORAL_TABLET | Freq: Three times a day (TID) | ORAL | Status: DC | PRN
  Administered 2016-06-17: 0.25 mg via ORAL
  Filled 2016-06-17 (×2): qty 1

## 2016-06-17 NOTE — Telephone Encounter (Signed)
patieh going home from hospital 06/17/2016 with home hospice. Pls give fu in 2-3 weeks with app  Dr. Brand Males, M.D., [facility].C.P Pulmonary and Critical Care Medicine Staff Physician [place] [facility] Pager: [phone], If no answer or between  15:00h - 7:00h: call [phone]  06/17/2016 3:35 PM

## 2016-06-17 NOTE — Care Management Note (Signed)
Case Management Note  Patient Details  Name: DESHAUNA CAYSON MRN: [id number] Date of Birth: Apr 09, 1928  Subjective/Objective:                 Spoke with patient at the bedside. She states that she would like to go home with hospice after speaking with palliative this afternoon. She states that she has home oxygen PRN/ Night use through [facility]. She was provided choice and would like to use [facility], referral made to [place] at [phone]. Patient states that she has two children that live in [place]/ [place] and a brother in [place]. Brother can provide transportation home. Patient denies need for DME (other than potential O2).    Action/Plan:   Expected Discharge Date:                  Expected Discharge Plan:  Start Referral:     Discharge planning Services  CM Consult  Post Acute Care Choice:    Choice offered to:  Patient  DME Arranged:    DME Agency:     HH Arranged:    [place] Agency:  [facility] [place] (referral made to Butch Penny @ [phone])  Status of Service:  Completed, signed off  If discussed at [place] of Stay Meetings, dates discussed:    Additional Comments:  Carles Collet, RN 06/17/2016, 4:59 PM

## 2016-06-17 NOTE — Consult Note (Signed)
Consultation Note Date: 06/17/2016   Patient Name: Sierra Warner  DOB: Feb 18, 1929  MRN: [id number]  Age / Sex: 83 y.o., female  PCP: Eloise Levels, NP Referring Physician: Rosita Fire, MD  Reason for Consultation: Establishing goals of care, Hospice Evaluation and Psychosocial/spiritual support  HPI/Patient Profile: 83 y.o. female  with past medical history of COPD on oxygen at home at night, hypertension, admitted on 06/13/2016 with COPD exacerbation. Patient was just discharged on a prednisone taper and nocturnal oxygen which she has been compliant with. She now presents with worsening dyspnea productive cough with yellow sputum, and satting 83% on room air. She was placed on a nonrebreather and improved to 98%.   Clinical Assessment and Goals of Care: Patient is alert and oriented 4. Her goal is to return home and to "live my life the way I want to, or die". She is rattled by how sick she has become so quickly. She reported prior "to all this happening", I was doing fine. She now sees herself as being unable to drive due to her own grocery shopping and is worried about living independently. Apparently she has had to stop beforehand she is on a waiting list at to assisted living facilities in town. She states she has a daughter that lives in [place] and a son that lives in [place].  Patient at this point can speak for herself. Her healthcare proxy would be her 2 children    SUMMARY OF RECOMMENDATIONS   Continue DO NOT RESUSCITATE/DO NOT INTUBATE Home with hospice care in the home Introduced the concept of managing acute dyspnea with opioids Patient is verbalizing feeling "panicky". Reviewed patient's med list with patient and recommended she use her Xanax as well as treating underlying dyspnea with morphine concentrate Code Status/Advance Care Planning:  DNR    Symptom Management:   Dyspnea:  Continue targeted pulmonary treatments such as oxygen nebulizer treatments and prednisone. Reiterated the usage of opioids for severe shortness of breath; continue with morphine concentrate  Anxiety: Patient is anxious today. Xanax was beneficial at bedtime in helping her to rest and subsequently go to sleep. Continue this at Xanax 0.25 twice daily as needed, or even 3 times daily as needed  Palliative Prophylaxis:   Aspiration, Bowel Regimen, Delirium Protocol, Frequent Pain Assessment, Oral Care and Turn Reposition  Additional Recommendations (Limitations, Scope, Preferences):  No Artificial Feeding, No Chemotherapy, No Radiation, No Surgical Procedures and No Tracheostomy  Psycho-social/Spiritual:   Desire for further Chaplaincy support:no  Additional Recommendations: Education on Hospice  Prognosis:   < 6 months in the setting of end-stage COPD, now requiring oxygen around-the-clock, decrease in functional status and that she can no longer drive, grocery shop, and is only able to walk to the bathroom and then has to stop at the sink and rest because her dyspnea upon exertion is so severe  Discharge Planning: Home with Hospice      Primary Diagnoses: Present on Admission: . COPD exacerbation ([place]) . (Resolved) Acute respiratory failure with hypoxia ([place]) .  COPD GOLD II with reversibility . Hypertension . Hyponatremia . COPD with acute exacerbation ([place]) . (Resolved) Acute on chronic respiratory failure with hypoxia ([place]) . Acute on chronic respiratory failure ([place]) . COPD, severe ([place])   I have reviewed the medical record, interviewed the patient and family, and examined the patient. The following aspects are pertinent.  Past Medical History:  Diagnosis Date  . Atrophic vaginitis   . COPD (chronic obstructive pulmonary disease) ([place])   . COPD, mild ([place])   . Hemorrhoids   . Hypertension   . Microscopic hematuria   . Osteopenia    Social History   Social History   . Marital status: Widowed    Spouse name: N/A  . Number of children: 2  . Years of education: N/A   Occupational History  . Retired     Psychologist, occupational   Social History Main Topics  . Smoking status: Former Smoker    Packs/day: 0.30    Years: 45.00    Types: Cigarettes    Quit date: 03/22/2007  . Smokeless tobacco: Never Used  . Alcohol use 2.0 oz/week    4 Standard drinks or equivalent per week  . Drug use: No  . Sexual activity: No   Other Topics Concern  . None   Social History Narrative   ** Merged History Encounter **       Family History  Problem Relation Age of Onset  . Diabetes Mother   . Hypertension Mother   . Heart disease Father   . Hypertension Father   . Heart failure Father    Scheduled Meds: . dextromethorphan-guaiFENesin  1 tablet Oral BID  . doxycycline  100 mg Oral Q12H  . enoxaparin (LOVENOX) injection  40 mg Subcutaneous Q24H  . fluticasone furoate-vilanterol  1 puff Inhalation Daily  . ipratropium-albuterol  3 mL Nebulization TID  . losartan  100 mg Oral Daily  . mouth rinse  15 mL Mouth Rinse BID  . polyethylene glycol  17 g Oral Daily  . potassium chloride  20 mEq Oral Daily  . predniSONE  40 mg Oral Q breakfast  . sodium chloride flush  3 mL Intravenous Q12H   Continuous Infusions: PRN Meds:.sodium chloride, acetaminophen **OR** acetaminophen, albuterol, ALPRAZolam, benzonatate, sodium chloride flush Medications Prior to Admission:  Prior to Admission medications   Medication Sig Start Date End Date Taking? Authorizing Provider  BREO ELLIPTA 100-25 MCG/INH AEPB INL 1 PUFF PO ONCE  A DAY 03/25/16  Yes Historical Provider, MD  Calcium Carbonate-Vitamin D (CALCIUM + D PO) Take by mouth 2 (two) times daily.     Yes Historical Provider, MD  cholecalciferol (VITAMIN D) 1000 UNITS tablet Take 1,000 Units by mouth daily.   Yes Historical Provider, MD  COMBIVENT RESPIMAT 20-100 MCG/ACT AERS respimat Inhale 1 puff into the lungs every 6 (six) hours as  needed for wheezing or shortness of breath.  06/05/16  Yes Historical Provider, MD  losartan (COZAAR) 100 MG tablet Take 1 tablet (100 mg total) by mouth daily. 06/10/16  Yes Doreatha Lew, MD  Multiple Vitamins-Minerals (MULTIVITAMIN GUMMIES ADULT PO) Take 1 tablet by mouth daily.    Yes Historical Provider, MD  Polyethylene Glycol 3350 (MIRALAX PO) Take 1 packet by mouth daily.    Yes Historical Provider, MD  predniSONE (DELTASONE) 10 MG tablet Take 4 tablets for 3 days; Take 3 tablets for 4 days; Take 2 tablets for 3 days; Take 1 tablet for 4 days 06/09/16  Yes Chipper Oman  Elon Jester, MD  SPIRIVA HANDIHALER 18 MCG inhalation capsule INHALE THE CONTENTS OF 1 CAP VIA HANDIHALER PO ONCE DAILY 05/30/16  Yes Historical Provider, MD   Allergies  Allergen Reactions  . Sulfa Antibiotics Hives and Rash   Review of Systems  Unable to perform ROS: Severe respiratory distress    Physical Exam  Constitutional: She is oriented to person, place, and time. She appears well-developed.  HENT:  Head: Normocephalic and atraumatic.  Cardiovascular: Normal rate.   Pulmonary/Chest:  Increased work of breathing Productive cough  Abdominal: Soft.  Musculoskeletal: Normal range of motion.  Neurological: She is alert and oriented to person, place, and time.  Skin: Skin is warm and dry.  Psychiatric: She has a normal mood and affect.  Nursing note and vitals reviewed.   Vital Signs: BP 135/69 (BP Location: Left Arm)   Pulse 78   Temp 97.4 F (36.3 C) (Oral)   Resp 16   Ht 5\' 2"  (1.575 m)   Wt 57.2 kg (126 lb)   SpO2 100%   BMI 23.05 kg/m  Pain Assessment: No/denies pain   Pain Score: 0-No pain   SpO2: SpO2: 100 % O2 Device:SpO2: 100 % O2 Flow Rate: .O2 Flow Rate (L/min): 2 L/min  IO: Intake/output summary:  Intake/Output Summary (Last 24 hours) at 06/17/16 1307 Last data filed at 06/16/16 1421  Gross per 24 hour  Intake              360 ml  Output                0 ml  Net              360  ml    LBM: Last BM Date: 06/13/16 Baseline Weight: Weight: 57.2 kg (126 lb) Most recent weight: Weight: 57.2 kg (126 lb)     Palliative Assessment/Data:   Flowsheet Rows     Most Recent Value  Intake Tab  Referral Department  Hospitalist  Unit at Time of Referral  Med/Surg Unit  Palliative Care Primary Diagnosis  Pulmonary  Date Notified  06/16/16  Palliative Care Type  New Palliative care  Reason for referral  Clarify Goals of Care, Counsel Regarding Hospice  Date of Admission  06/12/16  Date first seen by Palliative Care  06/17/16  # of days Palliative referral response time  1 Day(s)  # of days IP prior to Palliative referral  4  Clinical Assessment  Palliative Performance Scale Score  40%  Pain Max last 24 hours  0  Pain Min Last 24 hours  0  Dyspnea Max Last 24 Hours  7  Dyspnea Min Last 24 hours  4  Nausea Max Last 24 Hours  0  Nausea Min Last 24 Hours  0  Anxiety Max Last 24 Hours  6  Anxiety Min Last 24 Hours  2  Other Max Last 24 Hours  0  Psychosocial & Spiritual Assessment  Palliative Care Outcomes  Patient/Family meeting held?  Yes  Who was at the meeting?  pt  Palliative Care Outcomes  Clarified goals of care, Transitioned to hospice  Patient/Family wishes: Interventions discontinued/not started   Mechanical Ventilation, BiPAP, Hemodialysis, Transfusion, Vasopressors, Trach, Tube feedings/TPN, PEG      Time In: 1030 Time Out: 1140 Time Total: 70 min Greater than 50%  of this time was spent counseling and coordinating care related to the above assessment and plan. Staffed with Dr. Carolin Sicks  Signed by: Dory Horn, NP  Please contact Palliative Medicine Team phone at [phone] for questions and concerns.  For individual provider: See Shea Evans

## 2016-06-17 NOTE — Progress Notes (Signed)
PROGRESS NOTE    Sierra Warner  YSA:[id number] DOB: 1928-12-25 DOA: 06/13/2016 PCP: Eloise Levels, NP   Brief Narrative: 83 y.o.femalewith a history of tobacco use and COPD, HTN, and hyponatremia who presented to the ED for evaluation of dyspnea. She reports trouble breathing, specifically getting breath out, worsening, constant for the past 4 - 5 days since discharge from [facility] for COPD exacerbation. She was discharged on prednisone taper and nocturnal oxygen, which she has used, but this dyspnea associated with increased yellow sputum production has become severe. On arrival she was afebrile, tachycardic, and tachypneic, 83% on room air, improved to 98% on NRB.  Assessment & Plan:  # Acute on chronic hypoxic respiratory failure due to acute COPD exacerbation: - Patient is still short of breath and dyspneic, requiring about 2 liters of oxygen. Palliative care consult requested and discussed with them in detail. I had a long discussion with the patient this morning and she agreed with the palliative care evaluation and hospice care. After discussion of the palliative care medications were adjusted including addition of hycodan, mucinex and increase the frequency of Xanax as needed. -I discussed with Dr.Ramaswamy today, he agreed with the plan and recommended to discharge with prednisone tapering for 2 weeks. I also discussed with the case manager regarding home care services and hospice care arrangement. Continue to monitor. -continue doxycycline, duoneb, prednisone 40 mg orally and Mucinex.  - CT chest no PE.  - Echocardiogram with EF of 60-65%. Patient does not have CHF. -PT, OT evaluation.   #Hypertension: Monitor blood pressure. On losartan.   #Hyponatremia: Serum sodium level 134. Potassium level acceptable. Continue to monitor.    Principal Problem:   COPD exacerbation ([place]) Active Problems:   COPD GOLD II with reversibility   Hypertension   Acute on chronic respiratory failure  ([facility])   COPD, severe ([facility])   COPD with acute exacerbation ([facility])   Hyponatremia   Dyspnea   Goals of care, counseling/discussion   Palliative care encounter  DVT prophylaxis: Lovenox subcutaneous Code Status: DO NOT RESUSCITATE Family Communication: No family present at bedside Disposition Plan: Likely discharge home with home care in 1-2 days.  Consultants:   Pulmonary  Palliative care  Procedures: Echo Antimicrobials: Doxycycline  Subjective: Patient was seen and examined at bedside. Patient was still short of breath and dyspneic. Denied chest pain, nausea or vomiting.  . Objective: Vitals:   06/17/16 0629 06/17/16 0805 06/17/16 1327 06/17/16 1349  BP: 135/69   (!) 139/56  Pulse: 78   (!) 109  Resp: 16   (!) 24  Temp: 97.4 F (36.3 C)   97.9 F (36.6 C)  TempSrc: Oral   Oral  SpO2: 100% 100% 93% 96%  Weight:      Height:       No intake or output data in the 24 hours ending 06/17/16 1535 Filed Weights   06/13/16 1204  Weight: 57.2 kg (126 lb)    Examination:  General exam: Sitting on bed with increased respiratory effort Respiratory system: Bilateral mild expiratory wheeze, no crackle Cardiovascular system: S1 & S2 heard, RRR.  No pedal edema. Gastrointestinal system: Abdomen is nondistended, soft and nontender. Normal bowel sounds heard. Central nervous system: Alert and oriented. No focal neurological deficits. Extremities: Symmetric 5 x 5 power. Skin: No rashes, lesions or ulcers Psychiatry: Judgement and insight appear normal. Mood & affect appropriate.     Data Reviewed: I have personally reviewed following labs and imaging studies  CBC:  Recent  Labs Lab 06/13/16 1246 06/14/16 0644  WBC 14.4* 13.6*  NEUTROABS 13.4*  --   HGB 14.4 13.0  HCT 42.0 37.9  MCV 90.1 90.0  PLT 319 384   Basic Metabolic Panel:  Recent Labs Lab 06/13/16 1246 06/14/16 0644 06/16/16 0632  NA 132* 132* 134*  K 4.4 4.4 4.3  CL 87* 95* 94*  CO2 33* 27 32    GLUCOSE 135* 107* 85  BUN 15 16 17   CREATININE 0.78 0.80 0.76  CALCIUM 9.3 8.9 8.9   GFR: Estimated Creatinine Clearance: 40.7 mL/min (by C-G formula based on SCr of 0.76 mg/dL). Liver Function Tests: No results for input(s): AST, ALT, ALKPHOS, BILITOT, PROT, ALBUMIN in the last 168 hours. No results for input(s): LIPASE, AMYLASE in the last 168 hours. No results for input(s): AMMONIA in the last 168 hours. Coagulation Profile: No results for input(s): INR, PROTIME in the last 168 hours. Cardiac Enzymes:  Recent Labs Lab 06/13/16 1246  TROPONINI <0.03   BNP (last 3 results) No results for input(s): PROBNP in the last 8760 hours. HbA1C: No results for input(s): HGBA1C in the last 72 hours. CBG: No results for input(s): GLUCAP in the last 168 hours. Lipid Profile: No results for input(s): CHOL, HDL, LDLCALC, TRIG, CHOLHDL, LDLDIRECT in the last 72 hours. Thyroid Function Tests: No results for input(s): TSH, T4TOTAL, FREET4, T3FREE, THYROIDAB in the last 72 hours. Anemia Panel: No results for input(s): VITAMINB12, FOLATE, FERRITIN, TIBC, IRON, RETICCTPCT in the last 72 hours. Sepsis Labs: No results for input(s): PROCALCITON, LATICACIDVEN in the last 168 hours.  Recent Results (from the past 240 hour(s))  Culture, Urine     Status: Abnormal   Collection Time: 06/08/16  6:16 AM  Result Value Ref Range Status   Specimen Description URINE, RANDOM  Final   Special Requests NONE  Final   Culture MULTIPLE SPECIES PRESENT, SUGGEST RECOLLECTION (A)  Final   Report Status 06/09/2016 FINAL  Final         Radiology Studies: Ct Angio Chest Pe W Or Wo Contrast  Result Date: 06/16/2016 CLINICAL DATA:  Acute on chronic respiratory failure with hypoxia and hypercapnia. EXAM: CT ANGIOGRAPHY CHEST WITH CONTRAST TECHNIQUE: Multidetector CT imaging of the chest was performed using the standard protocol during bolus administration of intravenous contrast. Multiplanar CT image  reconstructions and MIPs were obtained to evaluate the vascular anatomy. CONTRAST:  100 cc Isovue 370 IV COMPARISON:  None. FINDINGS: Cardiovascular: Aortic atherosclerosis without aneurysm or dissection. Normal takeoff of the great vessels with minimal atherosclerosis of the left subclavian artery origin. No large central pulmonary embolus. Heart is normal in size. Minimal pericardial effusion or thickening anteriorly measuring up to 3 mm. Mediastinum/Nodes: No enlarged mediastinal, hilar, or axillary lymph nodes. Thyroid gland, trachea, and esophagus demonstrate no significant findings. Lungs/Pleura: No pulmonary consolidations. Mucus noted in the distal right main stem bronchus extending into the right lower lobe bronchi. Mild peribronchial thickening noted in the right lower lobe likely reflecting bronchitic change. Minimal mucous impaction to the right lower lobe lateral segment. No effusion or pneumothorax. Upper Abdomen: No acute abnormality. Partially visualized left adrenal gland is slightly full in appearance without definite mass. Musculoskeletal: No chest wall abnormality. No acute or significant osseous findings. Degenerative changes are seen along the dorsal spine. Review of the MIP images confirms the above findings. IMPRESSION: 1. No acute pulmonary embolus. Aortic atherosclerosis without aneurysm. 2. Right lower lobe bronchitic change with mucus noted in the distal right main stem bronchus  extending into the right lower lobe bronchi. Minimal mucus inspissation/impaction to the lateral segment of right lower lobe. Electronically Signed   By: Ashley Royalty M.D.   On: 06/16/2016 20:21        Scheduled Meds: . doxycycline  100 mg Oral Q12H  . enoxaparin (LOVENOX) injection  40 mg Subcutaneous Q24H  . fluticasone furoate-vilanterol  1 puff Inhalation Daily  . guaiFENesin  600 mg Oral BID  . ipratropium-albuterol  3 mL Nebulization TID  . losartan  100 mg Oral Daily  . mouth rinse  15 mL Mouth  Rinse BID  . polyethylene glycol  17 g Oral Daily  . potassium chloride  20 mEq Oral Daily  . predniSONE  40 mg Oral Q breakfast  . sodium chloride flush  3 mL Intravenous Q12H   Continuous Infusions:   LOS: 3 days    Dron Tanna Furry, MD [facility] Pager [phone]  If 7PM-7AM, please contact night-coverage [email or web address] Password [email or web address] 06/17/2016, 3:35 PM

## 2016-06-17 NOTE — Progress Notes (Signed)
Notified by Edwin Dada of family request for Hospice and [place] services at home after discharge. Chart and patient information currently under review to confirm hospice eligibility.  Spoke with patient, at bedside to initiate education related to hospice philosophy, services and team approach to care. Patient verbalized understanding of the information provided. Per discussion, plan is for discharge to home by personal vehicle with brother tomorrow.   Please send signed completed DNR form home with patient. Patient will need prescriptions for discharge comfort medications.  DME needs discussed and patient requested portable O2 for delivery to the home today. Notified [facility] who will contact Hassan Rowan, sister-in law to arrange time for delivery.  [facility] aware of the above. Completed discharge summary will need to be faxed to [facility] at [phone] when final. Please notify [facility] when patient is ready to leave unit at discharge-call [phone]. [facility] information and contact numbers have been given topatientduring visit.  Please call with any questions. Thank Rhae Lerner RN, [facility] Liaison [phone]

## 2016-06-17 NOTE — Care Management Important Message (Signed)
Important Message  Patient Details  Name: Sierra Warner MRN: [id number] Date of Birth: 03-15-1929   Medicare Important Message Given:  Yes    Nathen May 06/17/2016, 10:57 AM

## 2016-06-18 DIAGNOSIS — R0600 Dyspnea, unspecified: Secondary | ICD-10-CM

## 2016-06-18 MED ORDER — HYDROCODONE-HOMATROPINE 5-1.5 MG/5ML PO SYRP: 5.0000 mL | ORAL_SOLUTION | Freq: Four times a day (QID) | ORAL | 0 refills | Status: DC | PRN

## 2016-06-18 MED ORDER — ALPRAZOLAM 0.25 MG PO TABS: 0.2500 mg | ORAL_TABLET | Freq: Three times a day (TID) | ORAL | 0 refills | Status: AC | PRN

## 2016-06-18 MED ORDER — GUAIFENESIN ER 600 MG PO TB12: 600.0000 mg | ORAL_TABLET | Freq: Two times a day (BID) | ORAL | 0 refills | Status: DC

## 2016-06-18 MED ORDER — PREDNISONE 10 MG PO TABS: ORAL_TABLET | ORAL | 0 refills | Status: DC

## 2016-06-18 NOTE — Progress Notes (Signed)
[facility] to be D/C'd home per MD order.  Discussed with the patient and all questions fully answered.  VSS, Skin clean, dry and intact without evidence of skin break down, no evidence of skin tears noted. IV catheter discontinued intact. Site without signs and symptoms of complications. Dressing and pressure applied.  An After Visit Summary was printed and given to the patient. Patient received prescription.  D/c education completed with patient/family including follow up instructions, medication list, d/c activities limitations if indicated, with other d/c instructions as indicated by MD - patient able to verbalize understanding, all questions fully answered.   Patient instructed to return to ED, call 911, or call MD for any changes in condition.   Patient escorted via [place], and D/C home via private auto.  Milas Hock 06/18/2016 3:45 PM

## 2016-06-18 NOTE — Progress Notes (Signed)
06/18/2016  Pt is refusing any safety measures to prevent falls. Pt is A&OX4, and will not allow any safety measures to be activated such as placing a chiar behind her as she is washing up at the sink, or bed alarm being turned on while in bed.

## 2016-06-18 NOTE — Discharge Summary (Signed)
Physician Discharge Summary  Sierra Warner CWU:[id number] DOB: 08/26/1928 DOA: 06/13/2016  PCP: Eloise Levels, NP  Admit date: 06/13/2016 Discharge date: 06/18/2016  Admitted From:Home Disposition:home with home care and hospice.  Recommendations for Outpatient Follow-up:  1. Follow up with PCP in 1-2 weeks  Home Health:yes Equipment/Devices:no Discharge Condition:hospice CODE STATUS:DNR Diet recommendation:heart healthy.  Brief/Interim Summary: 83 y.o.femalewith a history of tobacco use and COPD, HTN, and hyponatremia who presented to the ED for evaluation of dyspnea. She reports trouble breathing, specifically getting breath out, worsening, constant for the past 4 - 5 days since discharge from [facility] for COPD exacerbation. She was discharged on prednisone taper and nocturnal oxygen, which she has used, but this dyspnea associated with increased yellow sputum production has become severe. On arrival she was afebrile, tachycardic, and tachypneic, 83% on room air, improved to 98% on NRB.  # Acute on chronic hypoxic respiratory failure due to acute COPD exacerbation: -Evaluated by patient's pulmonologist Dr.Ramaswamy without patient likely has end stage COPD. Recommended palliative care evaluation. Palliative care was consulted for goals of care discussion and dyspnea management. Medications adjusted for anxiety, hycodan, mucinex. Etc. Patient is very eager to go home. Pulmonologist recommended tapering dose of prednisone. Patient reported that her shortness of breath is better and requiring about 2 L of oxygen. She will be discharged home with home care and home hospice care. Completed antibiotics. Recommended to continue bronchodilators and follow up with primary care doctor and pulmonologist as an outpatient. - Home care services ordered. - CT chest no PE.  - Echocardiogram with EF of 60-65%. Patient does not have CHF.  #Hypertension: Monitor blood pressure. On losartan.   #Hyponatremia:  Serum sodium level 134. Potassium level acceptable. Continue to monitor.   Discharge Diagnoses:  Principal Problem:   COPD exacerbation ([place]) Active Problems:   COPD GOLD II with reversibility   Hypertension   Acute on chronic respiratory failure (HCC)   COPD, severe (HCC)   COPD with acute exacerbation (HCC)   Hyponatremia   Dyspnea   Goals of care, counseling/discussion   Palliative care encounter    Discharge Instructions  Discharge Instructions    Call MD for:  difficulty breathing, headache or visual disturbances    Complete by:  As directed    Call MD for:  extreme fatigue    Complete by:  As directed    Call MD for:  hives    Complete by:  As directed    Call MD for:  persistant dizziness or light-headedness    Complete by:  As directed    Call MD for:  persistant nausea and vomiting    Complete by:  As directed    Call MD for:  severe uncontrolled pain    Complete by:  As directed    Call MD for:  temperature >100.4    Complete by:  As directed    Diet - low sodium heart healthy    Complete by:  As directed    Increase activity slowly    Complete by:  As directed      Allergies as of 06/18/2016      Reactions   Sulfa Antibiotics Hives, Rash      Medication List    TAKE these medications   ALPRAZolam 0.25 MG tablet Commonly known as:  XANAX Take 1 tablet (0.25 mg total) by mouth 3 (three) times daily as needed for anxiety.   BREO ELLIPTA 100-25 MCG/INH Aepb Generic drug:  fluticasone furoate-vilanterol INL 1  PUFF PO ONCE  A DAY   CALCIUM + D PO Take by mouth 2 (two) times daily.   cholecalciferol 1000 units tablet Commonly known as:  VITAMIN D Take 1,000 Units by mouth daily.   COMBIVENT RESPIMAT 20-100 MCG/ACT Aers respimat Generic drug:  Ipratropium-Albuterol Inhale 1 puff into the lungs every 6 (six) hours as needed for wheezing or shortness of breath.   guaiFENesin 600 MG 12 hr tablet Commonly known as:  MUCINEX Take 1 tablet (600 mg  total) by mouth 2 (two) times daily.   HYDROcodone-homatropine 5-1.5 MG/5ML syrup Commonly known as:  HYCODAN Take 5 mLs by mouth every 6 (six) hours as needed for cough.   losartan 100 MG tablet Commonly known as:  COZAAR Take 1 tablet (100 mg total) by mouth daily.   MIRALAX PO Take 1 packet by mouth daily.   MULTIVITAMIN GUMMIES ADULT PO Take 1 tablet by mouth daily.   predniSONE 10 MG tablet Commonly known as:  DELTASONE Take 4 tablets for 3 days; Take 3 tablets for 4 days; Take 2 tablets for 3 days; Take 1 tablet for 4 days   SPIRIVA HANDIHALER 18 MCG inhalation capsule Generic drug:  tiotropium INHALE THE CONTENTS OF 1 CAP VIA HANDIHALER PO ONCE DAILY            Durable Medical Equipment        Start     Ordered   06/18/16 0949  DME Oxygen  Once    Question Answer Comment  Mode or (Route) Nasal cannula   Liters per Minute 2   Frequency Continuous (stationary and portable oxygen unit needed)   Oxygen conserving device Yes   Oxygen delivery system Gas      06/18/16 0949     Follow-up Information    [place] Follow up.   Why:  home health services arranged, office will call and set up home visits Contact information: [place] [address] [phone]        Eloise Levels, NP. Schedule an appointment as soon as possible for a visit in 1 week(s).   Specialty:  Nurse Practitioner Contact information: [phone] [place] [address] [phone]        RAMASWAMY,MURALI, MD Follow up.   Specialty:  Pulmonary Disease Why:  as per Dr. [name] the clinic will call with the appointment. Contact information: [place] [address] [phone]          Allergies  Allergen Reactions  . Sulfa Antibiotics Hives and Rash    Consultations: Pulmonologist Palliative care  Procedures/Studies: CT chest  Subjective: Patient was seen and examined at bedside. She was asking to go  home today. Agree to go home with home care services and hospice. Shortness of breath is better. Denied headache, dizziness, nausea, vomiting, chest pain.  Discharge Exam: Vitals:   06/17/16 2115 06/18/16 0451  BP: 134/76 138/66  Pulse: (!) 110 78  Resp: (!) 21 (!) 21  Temp: 98.5 F (36.9 C) 98.1 F (36.7 C)   Vitals:   06/17/16 2115 06/17/16 2138 06/18/16 0451 06/18/16 0855  BP: 134/76  138/66   Pulse: (!) 110  78   Resp: (!) 21  (!) 21   Temp: 98.5 F (36.9 C)  98.1 F (36.7 C)   TempSrc:      SpO2: 95% 96% 100% 98%  Weight:      Height:        General: Pt  is alert, awake, not in acute distress Cardiovascular: RRR, S1/S2 +, no rubs, no gallops Respiratory: Bilateral minimal wheezing, no crackle Abdominal: Soft, NT, ND, bowel sounds + Extremities: no edema, no cyanosis    The results of significant diagnostics from this hospitalization (including imaging, microbiology, ancillary and laboratory) are listed below for reference.     Microbiology: No results found for this or any previous visit (from the past 240 hour(s)).   Labs: BNP (last 3 results) No results for input(s): BNP in the last 8760 hours. Basic Metabolic Panel:  Recent Labs Lab 06/13/16 1246 06/14/16 0644 06/16/16 0632  NA 132* 132* 134*  K 4.4 4.4 4.3  CL 87* 95* 94*  CO2 33* 27 32  GLUCOSE 135* 107* 85  BUN 15 16 17   CREATININE 0.78 0.80 0.76  CALCIUM 9.3 8.9 8.9   Liver Function Tests: No results for input(s): AST, ALT, ALKPHOS, BILITOT, PROT, ALBUMIN in the last 168 hours. No results for input(s): LIPASE, AMYLASE in the last 168 hours. No results for input(s): AMMONIA in the last 168 hours. CBC:  Recent Labs Lab 06/13/16 1246 06/14/16 0644  WBC 14.4* 13.6*  NEUTROABS 13.4*  --   HGB 14.4 13.0  HCT 42.0 37.9  MCV 90.1 90.0  PLT 319 260   Cardiac Enzymes:  Recent Labs Lab 06/13/16 1246  TROPONINI <0.03   BNP: Invalid input(s): POCBNP CBG: No results for input(s):  GLUCAP in the last 168 hours. D-Dimer No results for input(s): DDIMER in the last 72 hours. Hgb A1c No results for input(s): HGBA1C in the last 72 hours. Lipid Profile No results for input(s): CHOL, HDL, LDLCALC, TRIG, CHOLHDL, LDLDIRECT in the last 72 hours. Thyroid function studies No results for input(s): TSH, T4TOTAL, T3FREE, THYROIDAB in the last 72 hours.  Invalid input(s): FREET3 Anemia work up No results for input(s): VITAMINB12, FOLATE, FERRITIN, TIBC, IRON, RETICCTPCT in the last 72 hours. Urinalysis    Component Value Date/Time   COLORURINE STRAW (A) 06/08/2016 0616   APPEARANCEUR CLEAR 06/08/2016 0616   LABSPEC 1.006 06/08/2016 0616   PHURINE 6.0 06/08/2016 0616   GLUCOSEU NEGATIVE 06/08/2016 0616   HGBUR SMALL (A) 06/08/2016 0616   BILIRUBINUR NEGATIVE 06/08/2016 0616   KETONESUR NEGATIVE 06/08/2016 0616   PROTEINUR NEGATIVE 06/08/2016 0616   UROBILINOGEN 0.2 12/20/2013 2253   NITRITE NEGATIVE 06/08/2016 0616   LEUKOCYTESUR NEGATIVE 06/08/2016 0616   Sepsis Labs Invalid input(s): PROCALCITONIN,  WBC,  LACTICIDVEN Microbiology No results found for this or any previous visit (from the past 240 hour(s)).   Time coordinating discharge: 28 minutes  SIGNED:   Rosita Fire, MD  [facility] 06/18/2016, 9:49 AM  If 7PM-7AM, please contact night-coverage [email or web address] Password [email or web address]

## 2016-06-18 NOTE — Progress Notes (Signed)
1000--[facility] Hospital Liaison RN note:  Met with patient in the room. Portable O2 not delivered at this time. Called Jermaine from [facility] for portable O2 tank delivery. Pt wants portable O2 concentrator delivered for use. Attempted to explain that may not be available from Hospice and that portable tank would allow her to get home and then use O2 concentrator that is already in the home.  Portable O2 tank delivered to room.   Emotional support provided. Encouraged her to discuss her desire for portable O2 concentrator with her primary nurse once admitted to hospice services.  Please call if any questions or concerns.  Thank you,  [facility] Liaison [phone]

## 2016-06-19 DIAGNOSIS — J449 Chronic obstructive pulmonary disease, unspecified: Secondary | ICD-10-CM | POA: Diagnosis not present

## 2016-06-19 DIAGNOSIS — J209 Acute bronchitis, unspecified: Secondary | ICD-10-CM | POA: Diagnosis not present

## 2016-06-19 DIAGNOSIS — J9620 Acute and chronic respiratory failure, unspecified whether with hypoxia or hypercapnia: Secondary | ICD-10-CM | POA: Diagnosis not present

## 2016-06-19 DIAGNOSIS — I10 Essential (primary) hypertension: Secondary | ICD-10-CM | POA: Diagnosis not present

## 2016-06-19 DIAGNOSIS — R630 Anorexia: Secondary | ICD-10-CM | POA: Diagnosis not present

## 2016-06-19 DIAGNOSIS — R634 Abnormal weight loss: Secondary | ICD-10-CM | POA: Diagnosis not present

## 2016-06-20 DIAGNOSIS — R634 Abnormal weight loss: Secondary | ICD-10-CM | POA: Diagnosis not present

## 2016-06-20 DIAGNOSIS — J449 Chronic obstructive pulmonary disease, unspecified: Secondary | ICD-10-CM | POA: Diagnosis not present

## 2016-06-20 DIAGNOSIS — J9620 Acute and chronic respiratory failure, unspecified whether with hypoxia or hypercapnia: Secondary | ICD-10-CM | POA: Diagnosis not present

## 2016-06-20 DIAGNOSIS — R630 Anorexia: Secondary | ICD-10-CM | POA: Diagnosis not present

## 2016-06-20 DIAGNOSIS — I10 Essential (primary) hypertension: Secondary | ICD-10-CM | POA: Diagnosis not present

## 2016-06-20 DIAGNOSIS — J209 Acute bronchitis, unspecified: Secondary | ICD-10-CM | POA: Diagnosis not present

## 2016-06-20 NOTE — Telephone Encounter (Signed)
Called pt daughter Kennyth Lose, requests that I contact the patient at home as she is currently in [place], patient is already scheduled with MR on 07/06/16. Pt states that she will have to work out transportation to get here.   Pt is wanting to try and get set up with a light-weight, O2 producing POC. Pt states that she is unable to get around with the heavy tanks that she is has. Pt states that she uses 2 liters O2 24/7 since being discharged from the hospital. Pt is very interested in getting back to being active but she is unable to do this with the current system she has and tanks.   Hospice is coming to the home today to their initial visit.  Please advise Dr [name]. Thanks.

## 2016-06-21 DIAGNOSIS — R634 Abnormal weight loss: Secondary | ICD-10-CM | POA: Diagnosis not present

## 2016-06-21 DIAGNOSIS — I10 Essential (primary) hypertension: Secondary | ICD-10-CM | POA: Diagnosis not present

## 2016-06-21 DIAGNOSIS — R630 Anorexia: Secondary | ICD-10-CM | POA: Diagnosis not present

## 2016-06-21 DIAGNOSIS — J449 Chronic obstructive pulmonary disease, unspecified: Secondary | ICD-10-CM | POA: Diagnosis not present

## 2016-06-21 DIAGNOSIS — J209 Acute bronchitis, unspecified: Secondary | ICD-10-CM | POA: Diagnosis not present

## 2016-06-21 DIAGNOSIS — J9620 Acute and chronic respiratory failure, unspecified whether with hypoxia or hypercapnia: Secondary | ICD-10-CM | POA: Diagnosis not present

## 2016-06-21 NOTE — Telephone Encounter (Signed)
[phone] Melissa calling back pt just switched over to Hospice and all stuff will have to go thru them

## 2016-06-21 NOTE — Telephone Encounter (Signed)
Ok for light weight o2 system poc  Dr. Brand Males, M.D., [facility].C.P Pulmonary and Critical Care Medicine Staff Physician [place] [facility] Pager: [phone], If no answer or between  15:00h - 7:00h: call [phone]  06/21/2016 6:45 AM

## 2016-06-21 NOTE — Telephone Encounter (Signed)
Lm for Sierra Warner with [facility] to return our call before placing order for POC. As pt was in exacerbation time of OV and we were unable to get ambulatory sats.  Will await call back.

## 2016-06-21 NOTE — Addendum Note (Signed)
Addended by: Maryanna Shape A on: 06/21/2016 10:54 AM   Modules accepted: Orders

## 2016-06-21 NOTE — Telephone Encounter (Signed)
Spoke with Horris Latino with Hospice and gave verbal. Horris Latino ask that we also fax order. Order placed. [facility] please assure that this is sent to [facility]. Thanks I have spoken with pt, to make her aware of this change.  Nothing further.

## 2016-06-22 DIAGNOSIS — J449 Chronic obstructive pulmonary disease, unspecified: Secondary | ICD-10-CM | POA: Diagnosis not present

## 2016-06-22 DIAGNOSIS — R630 Anorexia: Secondary | ICD-10-CM | POA: Diagnosis not present

## 2016-06-22 DIAGNOSIS — R634 Abnormal weight loss: Secondary | ICD-10-CM | POA: Diagnosis not present

## 2016-06-22 DIAGNOSIS — J9620 Acute and chronic respiratory failure, unspecified whether with hypoxia or hypercapnia: Secondary | ICD-10-CM | POA: Diagnosis not present

## 2016-06-22 DIAGNOSIS — J209 Acute bronchitis, unspecified: Secondary | ICD-10-CM | POA: Diagnosis not present

## 2016-06-22 DIAGNOSIS — I10 Essential (primary) hypertension: Secondary | ICD-10-CM | POA: Diagnosis not present

## 2016-06-24 DIAGNOSIS — J9620 Acute and chronic respiratory failure, unspecified whether with hypoxia or hypercapnia: Secondary | ICD-10-CM | POA: Diagnosis not present

## 2016-06-24 DIAGNOSIS — R634 Abnormal weight loss: Secondary | ICD-10-CM | POA: Diagnosis not present

## 2016-06-24 DIAGNOSIS — R630 Anorexia: Secondary | ICD-10-CM | POA: Diagnosis not present

## 2016-06-24 DIAGNOSIS — I10 Essential (primary) hypertension: Secondary | ICD-10-CM | POA: Diagnosis not present

## 2016-06-24 DIAGNOSIS — J449 Chronic obstructive pulmonary disease, unspecified: Secondary | ICD-10-CM | POA: Diagnosis not present

## 2016-06-24 DIAGNOSIS — J209 Acute bronchitis, unspecified: Secondary | ICD-10-CM | POA: Diagnosis not present

## 2016-06-27 DIAGNOSIS — I10 Essential (primary) hypertension: Secondary | ICD-10-CM | POA: Diagnosis not present

## 2016-06-27 DIAGNOSIS — J9620 Acute and chronic respiratory failure, unspecified whether with hypoxia or hypercapnia: Secondary | ICD-10-CM | POA: Diagnosis not present

## 2016-06-27 DIAGNOSIS — R634 Abnormal weight loss: Secondary | ICD-10-CM | POA: Diagnosis not present

## 2016-06-27 DIAGNOSIS — J209 Acute bronchitis, unspecified: Secondary | ICD-10-CM | POA: Diagnosis not present

## 2016-06-27 DIAGNOSIS — R630 Anorexia: Secondary | ICD-10-CM | POA: Diagnosis not present

## 2016-06-27 DIAGNOSIS — J449 Chronic obstructive pulmonary disease, unspecified: Secondary | ICD-10-CM | POA: Diagnosis not present

## 2016-06-29 DIAGNOSIS — J9620 Acute and chronic respiratory failure, unspecified whether with hypoxia or hypercapnia: Secondary | ICD-10-CM | POA: Diagnosis not present

## 2016-06-29 DIAGNOSIS — R630 Anorexia: Secondary | ICD-10-CM | POA: Diagnosis not present

## 2016-06-29 DIAGNOSIS — J449 Chronic obstructive pulmonary disease, unspecified: Secondary | ICD-10-CM | POA: Diagnosis not present

## 2016-06-29 DIAGNOSIS — J209 Acute bronchitis, unspecified: Secondary | ICD-10-CM | POA: Diagnosis not present

## 2016-06-29 DIAGNOSIS — R634 Abnormal weight loss: Secondary | ICD-10-CM | POA: Diagnosis not present

## 2016-06-29 DIAGNOSIS — I10 Essential (primary) hypertension: Secondary | ICD-10-CM | POA: Diagnosis not present

## 2016-07-04 DIAGNOSIS — J449 Chronic obstructive pulmonary disease, unspecified: Secondary | ICD-10-CM | POA: Diagnosis not present

## 2016-07-04 DIAGNOSIS — J9620 Acute and chronic respiratory failure, unspecified whether with hypoxia or hypercapnia: Secondary | ICD-10-CM | POA: Diagnosis not present

## 2016-07-04 DIAGNOSIS — R634 Abnormal weight loss: Secondary | ICD-10-CM | POA: Diagnosis not present

## 2016-07-04 DIAGNOSIS — J209 Acute bronchitis, unspecified: Secondary | ICD-10-CM | POA: Diagnosis not present

## 2016-07-04 DIAGNOSIS — R630 Anorexia: Secondary | ICD-10-CM | POA: Diagnosis not present

## 2016-07-04 DIAGNOSIS — I10 Essential (primary) hypertension: Secondary | ICD-10-CM | POA: Diagnosis not present

## 2016-07-06 ENCOUNTER — Ambulatory Visit (INDEPENDENT_AMBULATORY_CARE_PROVIDER_SITE_OTHER): Payer: Medicare Other | Admitting: Internal Medicine

## 2016-07-06 ENCOUNTER — Encounter: Payer: Self-pay | Admitting: Internal Medicine

## 2016-07-06 VITALS — BP 138/72 | HR 107 | Ht 62.0 in | Wt 121.2 lb

## 2016-07-06 DIAGNOSIS — J449 Chronic obstructive pulmonary disease, unspecified: Secondary | ICD-10-CM | POA: Diagnosis not present

## 2016-07-06 DIAGNOSIS — J9611 Chronic respiratory failure with hypoxia: Secondary | ICD-10-CM

## 2016-07-06 DIAGNOSIS — I10 Essential (primary) hypertension: Secondary | ICD-10-CM | POA: Diagnosis not present

## 2016-07-06 DIAGNOSIS — R634 Abnormal weight loss: Secondary | ICD-10-CM | POA: Diagnosis not present

## 2016-07-06 DIAGNOSIS — J9620 Acute and chronic respiratory failure, unspecified whether with hypoxia or hypercapnia: Secondary | ICD-10-CM | POA: Diagnosis not present

## 2016-07-06 DIAGNOSIS — R630 Anorexia: Secondary | ICD-10-CM | POA: Diagnosis not present

## 2016-07-06 DIAGNOSIS — J209 Acute bronchitis, unspecified: Secondary | ICD-10-CM | POA: Diagnosis not present

## 2016-07-06 NOTE — Progress Notes (Signed)
Subjective:     Patient ID: Sierra Warner, female   DOB: 1928/10/06, 83 y.o.   MRN: [id number]  HPI    OV 07/06/16  Chief Complaint  Patient presents with  . Follow-up    Pt states she was readmitted after initial admission on 3.20.18. Pt states she does not feel back to baseline. Pt states she has an occ cough.    2nd admit and discharged to home hospice. Slowly improving again. Questions need for hospce and prognosis. Liking hospice program now    Review of Systems     Objective:   Physical Exam  Vitals:   07/06/16 1232  BP: 138/72  Pulse: (!) 107  SpO2: 97%  Weight: 121 lb 3.2 oz (55 kg)  Height: 5\' 2"  (1.575 m)  looking better Purse lip breathing Frail Deconditioned No wheeze Full sentences   Mostly discussion visit    Assessment:       ICD-9-CM ICD-10-CM   1. Chronic respiratory failure with hypoxia (HCC) 518.83 J96.11    799.02    2. Stage 4 very severe COPD by GOLD classification ([place]) 496 J44.9        Plan:     Stage 4 very severe COPD by GOLD classification ([place]) Improved from hospitalziation but still within hospice range of prognosis  Plan Continue nebs as before Continue hospice care Continue o2 as before Do paper work with pcc to pay out of pocket and get innogen o2 system  Followup 2 months or sooner if needed   (> 50% of this 15 min visit spent in face to face counseling or/and coordination of care)   Dr. Brand Males, M.D., [facility].C.P Pulmonary and Critical Care Medicine Staff Physician [place] [facility] Critical [facility] Pager: [phone], If no answer or between  15:00h - 7:00h: call [phone]  07/06/2016 1:02 PM

## 2016-07-06 NOTE — Patient Instructions (Signed)
ICD-9-CM ICD-10-CM   1. Chronic respiratory failure with hypoxia (HCC) 518.83 J96.11    799.02    2. Stage 4 very severe COPD by GOLD classification ([place]) 496 J44.9    Stage 4 very severe COPD by GOLD classification ([place]) Improved from hospitalziation but still within hospice range of prognosis  Plan Continue nebs as before Continue hospice care Continue o2 as before Do paper work with pcc to pay out of pocket and get innogen o2 system  Followup 2 months or sooner if needed

## 2016-07-06 NOTE — Assessment & Plan Note (Signed)
Improved from hospitalziation but still within hospice range of prognosis  Plan Continue nebs as before Continue hospice care Continue o2 as before Do paper work with pcc to pay out of pocket and get innogen o2 system  Followup 2 months or sooner if needed

## 2016-07-08 ENCOUNTER — Telehealth: Payer: Self-pay | Admitting: Internal Medicine

## 2016-07-08 NOTE — Telephone Encounter (Signed)
Spoke with Mendel Ryder at [facility], aware that we will fax the 07/06/16 OV notes requested.  Fax verified, [phone] attn [email or web address]. Nothing further needed.

## 2016-07-11 DIAGNOSIS — J449 Chronic obstructive pulmonary disease, unspecified: Secondary | ICD-10-CM | POA: Diagnosis not present

## 2016-07-11 DIAGNOSIS — I10 Essential (primary) hypertension: Secondary | ICD-10-CM | POA: Diagnosis not present

## 2016-07-11 DIAGNOSIS — J209 Acute bronchitis, unspecified: Secondary | ICD-10-CM | POA: Diagnosis not present

## 2016-07-11 DIAGNOSIS — J9620 Acute and chronic respiratory failure, unspecified whether with hypoxia or hypercapnia: Secondary | ICD-10-CM | POA: Diagnosis not present

## 2016-07-11 DIAGNOSIS — R634 Abnormal weight loss: Secondary | ICD-10-CM | POA: Diagnosis not present

## 2016-07-11 DIAGNOSIS — R630 Anorexia: Secondary | ICD-10-CM | POA: Diagnosis not present

## 2016-07-13 DIAGNOSIS — J9620 Acute and chronic respiratory failure, unspecified whether with hypoxia or hypercapnia: Secondary | ICD-10-CM | POA: Diagnosis not present

## 2016-07-13 DIAGNOSIS — R634 Abnormal weight loss: Secondary | ICD-10-CM | POA: Diagnosis not present

## 2016-07-13 DIAGNOSIS — J209 Acute bronchitis, unspecified: Secondary | ICD-10-CM | POA: Diagnosis not present

## 2016-07-13 DIAGNOSIS — R630 Anorexia: Secondary | ICD-10-CM | POA: Diagnosis not present

## 2016-07-13 DIAGNOSIS — J449 Chronic obstructive pulmonary disease, unspecified: Secondary | ICD-10-CM | POA: Diagnosis not present

## 2016-07-13 DIAGNOSIS — I10 Essential (primary) hypertension: Secondary | ICD-10-CM | POA: Diagnosis not present

## 2016-07-14 DIAGNOSIS — J449 Chronic obstructive pulmonary disease, unspecified: Secondary | ICD-10-CM | POA: Diagnosis not present

## 2016-07-14 DIAGNOSIS — J9620 Acute and chronic respiratory failure, unspecified whether with hypoxia or hypercapnia: Secondary | ICD-10-CM | POA: Diagnosis not present

## 2016-07-14 DIAGNOSIS — J209 Acute bronchitis, unspecified: Secondary | ICD-10-CM | POA: Diagnosis not present

## 2016-07-14 DIAGNOSIS — I10 Essential (primary) hypertension: Secondary | ICD-10-CM | POA: Diagnosis not present

## 2016-07-14 DIAGNOSIS — R630 Anorexia: Secondary | ICD-10-CM | POA: Diagnosis not present

## 2016-07-14 DIAGNOSIS — R634 Abnormal weight loss: Secondary | ICD-10-CM | POA: Diagnosis not present

## 2016-07-15 ENCOUNTER — Telehealth: Payer: Self-pay | Admitting: Internal Medicine

## 2016-07-15 DIAGNOSIS — J449 Chronic obstructive pulmonary disease, unspecified: Secondary | ICD-10-CM | POA: Diagnosis not present

## 2016-07-15 DIAGNOSIS — J9620 Acute and chronic respiratory failure, unspecified whether with hypoxia or hypercapnia: Secondary | ICD-10-CM | POA: Diagnosis not present

## 2016-07-15 DIAGNOSIS — R630 Anorexia: Secondary | ICD-10-CM | POA: Diagnosis not present

## 2016-07-15 DIAGNOSIS — J209 Acute bronchitis, unspecified: Secondary | ICD-10-CM | POA: Diagnosis not present

## 2016-07-15 DIAGNOSIS — R634 Abnormal weight loss: Secondary | ICD-10-CM | POA: Diagnosis not present

## 2016-07-15 DIAGNOSIS — I10 Essential (primary) hypertension: Secondary | ICD-10-CM | POA: Diagnosis not present

## 2016-07-15 NOTE — Telephone Encounter (Signed)
Patient called back; confused and upset; would like to speak to nurse.Sierra Warner

## 2016-07-15 NOTE — Telephone Encounter (Signed)
Dr. Lyman Speller is calling MR to discuss this pt.  MR please advise. thanks

## 2016-07-15 NOTE — Telephone Encounter (Signed)
Ok Dr Lyman Speller is her doctor at hospice. LEt her know gnerally in hospice they prefer nebs because of advanced copd. However if she is intolerant to it I will communicate with Dr Lyman Speller  Dr. Brand Males, M.D., [facility].C.P Pulmonary and Critical Care Medicine Staff Physician [place] [facility] and Critical [facility] Pager: [phone], If no answer or between  15:00h - 7:00h: call [phone]  07/15/2016 2:17 PM

## 2016-07-15 NOTE — Telephone Encounter (Signed)
Called and spoke with pt and she stated that she has no idea who Dr. Lyman Speller is and she has never seen this Dr. Before.  She stated that she has used duoneb in the past and did not like it and it did not help.  MR please advise. thanks

## 2016-07-15 NOTE — Telephone Encounter (Signed)
D.w Dr Lyman Speller: I agree with his recommendation that she should be on duoneb. Please let patient know that is best she try this first for solid few weeks atleast

## 2016-07-15 NOTE — Telephone Encounter (Signed)
Spoke with pt, I explained that Dr. Lyman Speller is a hospice doctor.  Pt states "I don't see a doctor, I only see the nurse Ria Comment".   I advised that nurses work under doctors, even in hospice.  Pt seemed reluctant to take advise from a doctor she has never met before.   Pt states that she does not wish to take nebulized meds d/t self-reported ineffectiveness as well as bulkiness and upkeep of machine and supplies.    Will route back to MR- please advise after you've spoken to hospice.  Thanks!

## 2016-07-18 DIAGNOSIS — J449 Chronic obstructive pulmonary disease, unspecified: Secondary | ICD-10-CM | POA: Diagnosis not present

## 2016-07-18 DIAGNOSIS — J9620 Acute and chronic respiratory failure, unspecified whether with hypoxia or hypercapnia: Secondary | ICD-10-CM | POA: Diagnosis not present

## 2016-07-18 DIAGNOSIS — R630 Anorexia: Secondary | ICD-10-CM | POA: Diagnosis not present

## 2016-07-18 DIAGNOSIS — I10 Essential (primary) hypertension: Secondary | ICD-10-CM | POA: Diagnosis not present

## 2016-07-18 DIAGNOSIS — R634 Abnormal weight loss: Secondary | ICD-10-CM | POA: Diagnosis not present

## 2016-07-18 DIAGNOSIS — J209 Acute bronchitis, unspecified: Secondary | ICD-10-CM | POA: Diagnosis not present

## 2016-07-19 DIAGNOSIS — J449 Chronic obstructive pulmonary disease, unspecified: Secondary | ICD-10-CM | POA: Diagnosis not present

## 2016-07-19 DIAGNOSIS — J9620 Acute and chronic respiratory failure, unspecified whether with hypoxia or hypercapnia: Secondary | ICD-10-CM | POA: Diagnosis not present

## 2016-07-19 DIAGNOSIS — J209 Acute bronchitis, unspecified: Secondary | ICD-10-CM | POA: Diagnosis not present

## 2016-07-19 DIAGNOSIS — R630 Anorexia: Secondary | ICD-10-CM | POA: Diagnosis not present

## 2016-07-19 DIAGNOSIS — R634 Abnormal weight loss: Secondary | ICD-10-CM | POA: Diagnosis not present

## 2016-07-19 DIAGNOSIS — I10 Essential (primary) hypertension: Secondary | ICD-10-CM | POA: Diagnosis not present

## 2016-07-20 DIAGNOSIS — J209 Acute bronchitis, unspecified: Secondary | ICD-10-CM | POA: Diagnosis not present

## 2016-07-20 DIAGNOSIS — J9620 Acute and chronic respiratory failure, unspecified whether with hypoxia or hypercapnia: Secondary | ICD-10-CM | POA: Diagnosis not present

## 2016-07-20 DIAGNOSIS — R630 Anorexia: Secondary | ICD-10-CM | POA: Diagnosis not present

## 2016-07-20 DIAGNOSIS — J449 Chronic obstructive pulmonary disease, unspecified: Secondary | ICD-10-CM | POA: Diagnosis not present

## 2016-07-20 DIAGNOSIS — R634 Abnormal weight loss: Secondary | ICD-10-CM | POA: Diagnosis not present

## 2016-07-20 DIAGNOSIS — I10 Essential (primary) hypertension: Secondary | ICD-10-CM | POA: Diagnosis not present

## 2016-07-21 NOTE — Telephone Encounter (Signed)
Called and spoke with Ria Comment from [facility] and she stated that there are a few meds that are not on the Hospice formulary---the pharmacists recs these changes and they need to make sure that MR is ok with these.    breo, spiriva and combivent are not covered---the pharmacists recs change to duoneb QID and every 4 hours as needed  If a steroid is needed they can do either oral decadron or prednisone.  (pt has taken the prednisone before and is not a fan of this)  MR please advise. thanks

## 2016-07-21 NOTE — Telephone Encounter (Signed)
Ria Comment with [facility], [phone].  She is requesting a call back regarding medications.  Did not want to leave details just states she knows they have been speaking to Dr. [name] about this and would prefer to give nurse specifics.

## 2016-07-22 DIAGNOSIS — J449 Chronic obstructive pulmonary disease, unspecified: Secondary | ICD-10-CM | POA: Diagnosis not present

## 2016-07-22 DIAGNOSIS — J9620 Acute and chronic respiratory failure, unspecified whether with hypoxia or hypercapnia: Secondary | ICD-10-CM | POA: Diagnosis not present

## 2016-07-22 DIAGNOSIS — R630 Anorexia: Secondary | ICD-10-CM | POA: Diagnosis not present

## 2016-07-22 DIAGNOSIS — J209 Acute bronchitis, unspecified: Secondary | ICD-10-CM | POA: Diagnosis not present

## 2016-07-22 DIAGNOSIS — R634 Abnormal weight loss: Secondary | ICD-10-CM | POA: Diagnosis not present

## 2016-07-22 DIAGNOSIS — I10 Essential (primary) hypertension: Secondary | ICD-10-CM | POA: Diagnosis not present

## 2016-07-22 NOTE — Telephone Encounter (Signed)
LMTCB

## 2016-07-22 NOTE — Telephone Encounter (Signed)
Ria Comment is calling back.  States she just spoke with the patient and the patient is agreeable to trying nebulized medications now.  Please call her back, [phone].

## 2016-07-22 NOTE — Telephone Encounter (Signed)
Ria Comment returned call, CB is [phone].

## 2016-07-22 NOTE — Telephone Encounter (Signed)
lmtcb x1 Lindsay with Hospice.

## 2016-07-22 NOTE — Telephone Encounter (Signed)
Please tell hospice that patient simply will nto accept nebs.   Dr. Brand Males, M.D., [facility].C.P Pulmonary and Critical Care Medicine Staff Physician [place] [facility] and Critical [facility] Pager: [phone], If no answer or between  15:00h - 7:00h: call [phone]  07/22/2016 8:08 AM

## 2016-07-22 NOTE — Telephone Encounter (Signed)
Spoke with Ria Comment and she stated that she spoke with the pt and she did not mention not wanting to take neb medications. She stated she will reach out to pt and verify this because they sent pt a neb machine. She will call us back

## 2016-07-25 NOTE — Telephone Encounter (Signed)
MR  Please Advise-  Ria Comment called and stated she spoke with pt and she is agreeable to taking nebulized medications. Please see previous message from [place] about pt's duoneb

## 2016-07-26 DIAGNOSIS — R630 Anorexia: Secondary | ICD-10-CM | POA: Diagnosis not present

## 2016-07-26 DIAGNOSIS — J449 Chronic obstructive pulmonary disease, unspecified: Secondary | ICD-10-CM | POA: Diagnosis not present

## 2016-07-26 DIAGNOSIS — J9620 Acute and chronic respiratory failure, unspecified whether with hypoxia or hypercapnia: Secondary | ICD-10-CM | POA: Diagnosis not present

## 2016-07-26 DIAGNOSIS — R634 Abnormal weight loss: Secondary | ICD-10-CM | POA: Diagnosis not present

## 2016-07-26 DIAGNOSIS — J209 Acute bronchitis, unspecified: Secondary | ICD-10-CM | POA: Diagnosis not present

## 2016-07-26 DIAGNOSIS — I10 Essential (primary) hypertension: Secondary | ICD-10-CM | POA: Diagnosis not present

## 2016-07-26 MED ORDER — IPRATROPIUM-ALBUTEROL 0.5-2.5 (3) MG/3ML IN SOLN: 3.0000 mL | Freq: Four times a day (QID) | RESPIRATORY_TRACT | 11 refills | Status: DC

## 2016-07-26 MED ORDER — FLUTICASONE PROPIONATE HFA 110 MCG/ACT IN AERO: 2.0000 | INHALATION_SPRAY | Freq: Two times a day (BID) | RESPIRATORY_TRACT | 12 refills | Status: DC

## 2016-07-26 NOTE — Telephone Encounter (Signed)
Ok plese send for duoneb 4 times daily scheduled + prn but patient also needs inhaled steroid - can go with flovent 127mcg 2 puff bid or do pulmicort nebulizer 0.25mg  bid - whichever is cheaper  Dr. Brand Males, M.D., [facility].C.P Pulmonary and Critical Care Medicine Staff Physician [place] [facility]monary and Critical Care Pager: [phone], If no answer or between  15:00h - 7:00h: call [phone]  07/26/2016 4:33 PM

## 2016-07-26 NOTE — Telephone Encounter (Signed)
Spoke with the pt to confirm pharm  Rxs sent and nothing further needed

## 2016-07-28 ENCOUNTER — Telehealth: Payer: Self-pay | Admitting: Internal Medicine

## 2016-07-28 DIAGNOSIS — J9620 Acute and chronic respiratory failure, unspecified whether with hypoxia or hypercapnia: Secondary | ICD-10-CM | POA: Diagnosis not present

## 2016-07-28 DIAGNOSIS — R634 Abnormal weight loss: Secondary | ICD-10-CM | POA: Diagnosis not present

## 2016-07-28 DIAGNOSIS — R630 Anorexia: Secondary | ICD-10-CM | POA: Diagnosis not present

## 2016-07-28 DIAGNOSIS — I10 Essential (primary) hypertension: Secondary | ICD-10-CM | POA: Diagnosis not present

## 2016-07-28 DIAGNOSIS — J209 Acute bronchitis, unspecified: Secondary | ICD-10-CM | POA: Diagnosis not present

## 2016-07-28 DIAGNOSIS — J449 Chronic obstructive pulmonary disease, unspecified: Secondary | ICD-10-CM | POA: Diagnosis not present

## 2016-07-28 NOTE — Telephone Encounter (Signed)
LM for Sierra Warner at [facility] x 1

## 2016-07-29 DIAGNOSIS — J9620 Acute and chronic respiratory failure, unspecified whether with hypoxia or hypercapnia: Secondary | ICD-10-CM | POA: Diagnosis not present

## 2016-07-29 DIAGNOSIS — R634 Abnormal weight loss: Secondary | ICD-10-CM | POA: Diagnosis not present

## 2016-07-29 DIAGNOSIS — J209 Acute bronchitis, unspecified: Secondary | ICD-10-CM | POA: Diagnosis not present

## 2016-07-29 DIAGNOSIS — R630 Anorexia: Secondary | ICD-10-CM | POA: Diagnosis not present

## 2016-07-29 DIAGNOSIS — I10 Essential (primary) hypertension: Secondary | ICD-10-CM | POA: Diagnosis not present

## 2016-07-29 DIAGNOSIS — J449 Chronic obstructive pulmonary disease, unspecified: Secondary | ICD-10-CM | POA: Diagnosis not present

## 2016-07-29 NOTE — Telephone Encounter (Signed)
Abigail Butts called back and she stated that the pts meds were recently changed but hospice has a very limited list of meds that are covered by them for pulmonary issues.  Abigail Butts stated that the duoneb is covered, but her other meds are not.    Is it ok to D/C the breo, spiriva, flovent and combivent as these are not covered by Hospice formulary?  duoneb seems to be the only med covered by hospice formulary.  MR please advise. Thanks  Allergies  Allergen Reactions  . Sulfa Antibiotics Hives and Rash

## 2016-07-29 NOTE — Telephone Encounter (Signed)
Called and spoke with Abigail Butts with Hospice and she is aware of MR recs.  duoneb will be QID and every 2 hrs prn.  All of her baseline meds will be stopped.

## 2016-07-29 NOTE — Telephone Encounter (Signed)
lmomtcb x 2 for Sierra Warner.

## 2016-07-29 NOTE — Telephone Encounter (Signed)
Dc all her baseline meds Do duoneb 4 times daily + prn   Thanks  Dr. Brand Males, M.D., [facility].C.P Pulmonary and Critical Care Medicine Staff Physician [place] [facility] Critical [facility] Pager: [phone], If no answer or between  15:00h - 7:00h: call [phone]  07/29/2016 10:40 AM

## 2016-08-01 DIAGNOSIS — J9620 Acute and chronic respiratory failure, unspecified whether with hypoxia or hypercapnia: Secondary | ICD-10-CM | POA: Diagnosis not present

## 2016-08-01 DIAGNOSIS — J449 Chronic obstructive pulmonary disease, unspecified: Secondary | ICD-10-CM | POA: Diagnosis not present

## 2016-08-01 DIAGNOSIS — R630 Anorexia: Secondary | ICD-10-CM | POA: Diagnosis not present

## 2016-08-01 DIAGNOSIS — R634 Abnormal weight loss: Secondary | ICD-10-CM | POA: Diagnosis not present

## 2016-08-01 DIAGNOSIS — I10 Essential (primary) hypertension: Secondary | ICD-10-CM | POA: Diagnosis not present

## 2016-08-01 DIAGNOSIS — J209 Acute bronchitis, unspecified: Secondary | ICD-10-CM | POA: Diagnosis not present

## 2016-08-02 DIAGNOSIS — I10 Essential (primary) hypertension: Secondary | ICD-10-CM | POA: Diagnosis not present

## 2016-08-02 DIAGNOSIS — R630 Anorexia: Secondary | ICD-10-CM | POA: Diagnosis not present

## 2016-08-02 DIAGNOSIS — R634 Abnormal weight loss: Secondary | ICD-10-CM | POA: Diagnosis not present

## 2016-08-02 DIAGNOSIS — J449 Chronic obstructive pulmonary disease, unspecified: Secondary | ICD-10-CM | POA: Diagnosis not present

## 2016-08-02 DIAGNOSIS — J9620 Acute and chronic respiratory failure, unspecified whether with hypoxia or hypercapnia: Secondary | ICD-10-CM | POA: Diagnosis not present

## 2016-08-02 DIAGNOSIS — J209 Acute bronchitis, unspecified: Secondary | ICD-10-CM | POA: Diagnosis not present

## 2016-08-05 DIAGNOSIS — I10 Essential (primary) hypertension: Secondary | ICD-10-CM | POA: Diagnosis not present

## 2016-08-05 DIAGNOSIS — R634 Abnormal weight loss: Secondary | ICD-10-CM | POA: Diagnosis not present

## 2016-08-05 DIAGNOSIS — J209 Acute bronchitis, unspecified: Secondary | ICD-10-CM | POA: Diagnosis not present

## 2016-08-05 DIAGNOSIS — J9620 Acute and chronic respiratory failure, unspecified whether with hypoxia or hypercapnia: Secondary | ICD-10-CM | POA: Diagnosis not present

## 2016-08-05 DIAGNOSIS — J449 Chronic obstructive pulmonary disease, unspecified: Secondary | ICD-10-CM | POA: Diagnosis not present

## 2016-08-05 DIAGNOSIS — R630 Anorexia: Secondary | ICD-10-CM | POA: Diagnosis not present

## 2016-08-10 DIAGNOSIS — J9620 Acute and chronic respiratory failure, unspecified whether with hypoxia or hypercapnia: Secondary | ICD-10-CM | POA: Diagnosis not present

## 2016-08-10 DIAGNOSIS — J209 Acute bronchitis, unspecified: Secondary | ICD-10-CM | POA: Diagnosis not present

## 2016-08-10 DIAGNOSIS — I10 Essential (primary) hypertension: Secondary | ICD-10-CM | POA: Diagnosis not present

## 2016-08-10 DIAGNOSIS — R630 Anorexia: Secondary | ICD-10-CM | POA: Diagnosis not present

## 2016-08-10 DIAGNOSIS — J449 Chronic obstructive pulmonary disease, unspecified: Secondary | ICD-10-CM | POA: Diagnosis not present

## 2016-08-10 DIAGNOSIS — R634 Abnormal weight loss: Secondary | ICD-10-CM | POA: Diagnosis not present

## 2016-08-12 DIAGNOSIS — J449 Chronic obstructive pulmonary disease, unspecified: Secondary | ICD-10-CM | POA: Diagnosis not present

## 2016-08-12 DIAGNOSIS — R630 Anorexia: Secondary | ICD-10-CM | POA: Diagnosis not present

## 2016-08-12 DIAGNOSIS — J9620 Acute and chronic respiratory failure, unspecified whether with hypoxia or hypercapnia: Secondary | ICD-10-CM | POA: Diagnosis not present

## 2016-08-12 DIAGNOSIS — J209 Acute bronchitis, unspecified: Secondary | ICD-10-CM | POA: Diagnosis not present

## 2016-08-12 DIAGNOSIS — R634 Abnormal weight loss: Secondary | ICD-10-CM | POA: Diagnosis not present

## 2016-08-12 DIAGNOSIS — I10 Essential (primary) hypertension: Secondary | ICD-10-CM | POA: Diagnosis not present

## 2016-08-16 DIAGNOSIS — I10 Essential (primary) hypertension: Secondary | ICD-10-CM | POA: Diagnosis not present

## 2016-08-16 DIAGNOSIS — J209 Acute bronchitis, unspecified: Secondary | ICD-10-CM | POA: Diagnosis not present

## 2016-08-16 DIAGNOSIS — R634 Abnormal weight loss: Secondary | ICD-10-CM | POA: Diagnosis not present

## 2016-08-16 DIAGNOSIS — J9620 Acute and chronic respiratory failure, unspecified whether with hypoxia or hypercapnia: Secondary | ICD-10-CM | POA: Diagnosis not present

## 2016-08-16 DIAGNOSIS — J449 Chronic obstructive pulmonary disease, unspecified: Secondary | ICD-10-CM | POA: Diagnosis not present

## 2016-08-16 DIAGNOSIS — R630 Anorexia: Secondary | ICD-10-CM | POA: Diagnosis not present

## 2016-08-17 ENCOUNTER — Telehealth: Payer: Self-pay | Admitting: Internal Medicine

## 2016-08-17 NOTE — Telephone Encounter (Signed)
Checked Sierra Warner's box but did not see the fax, called them back and asked that this be faxed to the triage fax machine.

## 2016-08-18 NOTE — Telephone Encounter (Signed)
This has been placed with Sierra Warner things while she is in clinic to place in MR's look at since he is currently seeing pts. Will forward to [place] to follow up on

## 2016-08-19 DIAGNOSIS — J209 Acute bronchitis, unspecified: Secondary | ICD-10-CM | POA: Diagnosis not present

## 2016-08-19 DIAGNOSIS — R630 Anorexia: Secondary | ICD-10-CM | POA: Diagnosis not present

## 2016-08-19 DIAGNOSIS — J449 Chronic obstructive pulmonary disease, unspecified: Secondary | ICD-10-CM | POA: Diagnosis not present

## 2016-08-19 DIAGNOSIS — I10 Essential (primary) hypertension: Secondary | ICD-10-CM | POA: Diagnosis not present

## 2016-08-19 DIAGNOSIS — R634 Abnormal weight loss: Secondary | ICD-10-CM | POA: Diagnosis not present

## 2016-08-19 DIAGNOSIS — J9620 Acute and chronic respiratory failure, unspecified whether with hypoxia or hypercapnia: Secondary | ICD-10-CM | POA: Diagnosis not present

## 2016-08-19 NOTE — Telephone Encounter (Signed)
This has been placed in MR's look-at folder.

## 2016-08-23 ENCOUNTER — Encounter: Payer: Self-pay | Admitting: Internal Medicine

## 2016-08-23 ENCOUNTER — Ambulatory Visit (INDEPENDENT_AMBULATORY_CARE_PROVIDER_SITE_OTHER): Payer: Medicare Other | Admitting: Internal Medicine

## 2016-08-23 DIAGNOSIS — R634 Abnormal weight loss: Secondary | ICD-10-CM | POA: Diagnosis not present

## 2016-08-23 DIAGNOSIS — J449 Chronic obstructive pulmonary disease, unspecified: Secondary | ICD-10-CM | POA: Diagnosis not present

## 2016-08-23 DIAGNOSIS — J9620 Acute and chronic respiratory failure, unspecified whether with hypoxia or hypercapnia: Secondary | ICD-10-CM | POA: Diagnosis not present

## 2016-08-23 DIAGNOSIS — J209 Acute bronchitis, unspecified: Secondary | ICD-10-CM | POA: Diagnosis not present

## 2016-08-23 DIAGNOSIS — I10 Essential (primary) hypertension: Secondary | ICD-10-CM | POA: Diagnosis not present

## 2016-08-23 DIAGNOSIS — I83893 Varicose veins of bilateral lower extremities with other complications: Secondary | ICD-10-CM | POA: Diagnosis not present

## 2016-08-23 DIAGNOSIS — R630 Anorexia: Secondary | ICD-10-CM | POA: Diagnosis not present

## 2016-08-23 MED ORDER — FUROSEMIDE 20 MG PO TABS: 10.0000 mg | ORAL_TABLET | Freq: Every day | ORAL | 0 refills | Status: DC

## 2016-08-23 NOTE — Assessment & Plan Note (Addendum)
Stable disase but prognosis is less than 6 months  Plan Continue o2 and nebs/inhalers Supportive care via hospice  followup 3 months or sooner if needed

## 2016-08-23 NOTE — Assessment & Plan Note (Signed)
Recommend compression stockings Keep leg elevated when you sleep at night Try low dose lasix 10mg  per day ad needed - take 30 day supply Problems persist - talk to Avelino Leeds, NP (Inactive)

## 2016-08-23 NOTE — Addendum Note (Signed)
Addended by: Collier Salina on: 08/23/2016 03:40 PM   Modules accepted: Orders

## 2016-08-23 NOTE — Progress Notes (Signed)
Subjective:     Patient ID: Sierra Warner, female   DOB: November 27, 1928, 83 y.o.   MRN: [id number]  HPI    OV 08/23/2016  Chief Complaint  Patient presents with  . Follow-up    Pt states her breathing is unchanged since last OV. Pt c/o cough with little mucus production. Pt denies CP/tightness and f/c/s.   83 year old female Gold stage IV COPD. She is now under the care of hospice which is paradoxically of the functional status. She is driving. She is on oxygen. She is on nebulizers. Her new complaint is that she's got bilateral pedal edema worse in the daytime as the day progresses. Better with night and at rest. She is unaware she has bilateral varicose veins. Initially she was reluctant to take hospice a few months ago but now she says she's loving it because of the extra layer of supportive provided. She is now worried that she will live past 6 months expectancy and she will "fired" from the hospice. Nevertheless have told her that with her advanced COPD the prognosis is less than 6 months     has a past medical history of Atrophic vaginitis; COPD (chronic obstructive pulmonary disease) ([place]); COPD, mild ([place]); Hemorrhoids; Hypertension; Microscopic hematuria; and Osteopenia.   reports that she quit smoking about 9 years ago. Her smoking use included Cigarettes. She has a 13.50 pack-year smoking history. She has never used smokeless tobacco.  Past Surgical History:  Procedure Laterality Date  . APPENDECTOMY  1933  . CATARACT SURGERY    . CYSTOSCOPY    . THYROIDECTOMY, PARTIAL    . TONSILLECTOMY      Allergies  Allergen Reactions  . Sulfa Antibiotics Hives and Rash    Immunization History  Administered Date(s) Administered  . Influenza Split 12/14/2011, 12/18/2012  . Influenza Whole 12/20/2010  . Influenza, High Dose Seasonal PF 12/23/2015  . Influenza,inj,Quad PF,36+ Mos 11/26/2013, 12/20/2014  . Pneumococcal Conjugate-13 02/26/2013  . Pneumococcal Polysaccharide-23 10/13/2014     Family History  Problem Relation Age of Onset  . Diabetes Mother   . Hypertension Mother   . Heart disease Father   . Hypertension Father   . Heart failure Father      Current Outpatient Prescriptions:  .  ALPRAZolam (XANAX) 0.25 MG tablet, Take 1 tablet (0.25 mg total) by mouth 3 (three) times daily as needed for anxiety., Disp: 12 tablet, Rfl: 0 .  BREO ELLIPTA 100-25 MCG/INH AEPB, INL 1 PUFF PO ONCE  A DAY, Disp: , Rfl: 0 .  Calcium Carbonate-Vitamin D (CALCIUM + D PO), Take by mouth 2 (two) times daily.  , Disp: , Rfl:  .  cholecalciferol (VITAMIN D) 1000 UNITS tablet, Take 1,000 Units by mouth daily., Disp: , Rfl:  .  COMBIVENT RESPIMAT 20-100 MCG/ACT AERS respimat, Inhale 1 puff into the lungs every 6 (six) hours as needed for wheezing or shortness of breath. , Disp: , Rfl:  .  ipratropium-albuterol (DUONEB) 0.5-2.5 (3) MG/3ML SOLN, Take 3 mLs by nebulization 4 (four) times daily. And as needed, Disp: 360 mL, Rfl: 11 .  losartan (COZAAR) 100 MG tablet, Take 1 tablet (100 mg total) by mouth daily., Disp: 30 tablet, Rfl: 0 .  Multiple Vitamins-Minerals (MULTIVITAMIN GUMMIES ADULT PO), Take 1 tablet by mouth daily. , Disp: , Rfl:  .  Polyethylene Glycol 3350 (MIRALAX PO), Take 1 packet by mouth daily. , Disp: , Rfl:  .  SPIRIVA HANDIHALER 18 MCG inhalation capsule, INHALE THE CONTENTS OF 1  CAP VIA HANDIHALER PO ONCE DAILY, Disp: , Rfl: 6   Review of Systems     Objective:   Physical Exam  Constitutional: She is oriented to person, place, and time. She appears well-developed and well-nourished. No distress.  HENT:  Head: Normocephalic and atraumatic.  Right Ear: External ear normal.  Left Ear: External ear normal.  Mouth/Throat: Oropharynx is clear and moist. No oropharyngeal exudate.  Eyes: Conjunctivae and EOM are normal. Pupils are equal, round, and reactive to light. Right eye exhibits no discharge. Left eye exhibits no discharge. No scleral icterus.  Neck: Normal range  of motion. Neck supple. No JVD present. No tracheal deviation present. No thyromegaly present.  Cardiovascular: Normal rate, regular rhythm, normal heart sounds and intact distal pulses.  Exam reveals no gallop and no friction rub.   No murmur heard. Pulmonary/Chest: Effort normal and breath sounds normal. No respiratory distress. She has no wheezes. She has no rales. She exhibits no tenderness.  Purse lip breathing Oxygen on No wheeze  Abdominal: Soft. Bowel sounds are normal. She exhibits no distension and no mass. There is no tenderness. There is no rebound and no guarding.  Musculoskeletal: Normal range of motion. She exhibits edema. She exhibits no tenderness.  Bilateral varicose veins present  Lymphadenopathy:    She has no cervical adenopathy.  Neurological: She is alert and oriented to person, place, and time. She has normal reflexes. No cranial nerve deficit. She exhibits normal muscle tone. Coordination normal.  Skin: Skin is warm and dry. No rash noted. She is not diaphoretic. No erythema. No pallor.  Psychiatric: She has a normal mood and affect. Her behavior is normal. Judgment and thought content normal.  Vitals reviewed.  Vitals:   08/23/16 1503  BP: (!) 142/70  Pulse: (!) 101  SpO2: 94%  Weight: 123 lb (55.8 kg)  Height: 5\' 1"  (1.549 m)    Estimated body mass index is 23.24 kg/m as calculated from the following:   Height as of this encounter: 5\' 1"  (1.549 m).   Weight as of this encounter: 123 lb (55.8 kg).       Assessment:       ICD-9-CM ICD-10-CM   1. Varicose veins of both legs with edema 454.8 I83.893   2. Stage 4 very severe COPD by GOLD classification ([place]) 496 J44.9        Plan:     Patient Active Problem List   Diagnosis Date Noted  . Dyspnea 06/15/2016    Priority: High  . COPD with acute exacerbation ([place]) 06/07/2016    Priority: High  . Acute on chronic respiratory failure ([place]) 06/06/2011    Priority: High  . COPD, severe ([place])  02/17/2014    Priority: Medium  . Varicose veins of both legs with edema 08/23/2016  . Palliative care encounter   . Goals of care, counseling/discussion 06/16/2016  . Hyponatremia 06/07/2016  . COPD exacerbation ([place])   . Dry skin 04/17/2015  . Environmental and seasonal allergies 06/09/2013  . Stage 4 very severe COPD by GOLD classification ([place]) 04/04/2011  . Hypertension 04/04/2011  . Senile osteoporosis 10/19/2010   Varicose veins of both legs with edema Recommend compression stockings Keep leg elevated when you sleep at night Try low dose lasix 10mg  per day ad needed - take 30 day supply Problems persist - talk to Avelino Leeds, NP (Inactive)   Stage 4 very severe COPD by GOLD classification ([place]) Stable disase but prognosis is less than 6 months  Plan Continue o2 and nebs/inhalers Supportive care via hospice  followup 3 months or sooner if needed    Dr. Brand Males, M.D., [facility].C.P Pulmonary and Critical Care Medicine Staff Physician [place] [facility] Pager: [phone], If no answer or between  15:00h - 7:00h: call [phone]  08/23/2016 3:31 PM

## 2016-08-23 NOTE — Patient Instructions (Signed)
Varicose veins of both legs with edema Recommend compression stockings Keep leg elevated when you sleep at night Try low dose lasix 10mg  per day ad needed - take 30 day supply Problems persist - talk to Sierra Leeds, NP (Inactive)   Stage 4 very severe COPD by GOLD classification (Sierra Warner) Stable disase  Plan Continue o2 and nebs/inhalers Supportive care via hospice  followup 3 months or sooner if needed

## 2016-08-24 ENCOUNTER — Telehealth: Payer: Self-pay | Admitting: Internal Medicine

## 2016-08-24 DIAGNOSIS — R630 Anorexia: Secondary | ICD-10-CM | POA: Diagnosis not present

## 2016-08-24 DIAGNOSIS — R634 Abnormal weight loss: Secondary | ICD-10-CM | POA: Diagnosis not present

## 2016-08-24 DIAGNOSIS — J449 Chronic obstructive pulmonary disease, unspecified: Secondary | ICD-10-CM | POA: Diagnosis not present

## 2016-08-24 DIAGNOSIS — I10 Essential (primary) hypertension: Secondary | ICD-10-CM | POA: Diagnosis not present

## 2016-08-24 DIAGNOSIS — J209 Acute bronchitis, unspecified: Secondary | ICD-10-CM | POA: Diagnosis not present

## 2016-08-24 DIAGNOSIS — J9620 Acute and chronic respiratory failure, unspecified whether with hypoxia or hypercapnia: Secondary | ICD-10-CM | POA: Diagnosis not present

## 2016-08-24 NOTE — Telephone Encounter (Signed)
OV note has been faxed to the number provided

## 2016-08-24 NOTE — Telephone Encounter (Signed)
Daneil Dan- did he take care of this yet? Please advise, thanks

## 2016-08-25 NOTE — Telephone Encounter (Signed)
Not yet, MR has his folder and is in the process of reviewing those records. Once completed I will update chart.

## 2016-08-26 DIAGNOSIS — R630 Anorexia: Secondary | ICD-10-CM | POA: Diagnosis not present

## 2016-08-26 DIAGNOSIS — J209 Acute bronchitis, unspecified: Secondary | ICD-10-CM | POA: Diagnosis not present

## 2016-08-26 DIAGNOSIS — J449 Chronic obstructive pulmonary disease, unspecified: Secondary | ICD-10-CM | POA: Diagnosis not present

## 2016-08-26 DIAGNOSIS — I10 Essential (primary) hypertension: Secondary | ICD-10-CM | POA: Diagnosis not present

## 2016-08-26 DIAGNOSIS — R634 Abnormal weight loss: Secondary | ICD-10-CM | POA: Diagnosis not present

## 2016-08-26 DIAGNOSIS — J9620 Acute and chronic respiratory failure, unspecified whether with hypoxia or hypercapnia: Secondary | ICD-10-CM | POA: Diagnosis not present

## 2016-08-26 NOTE — Telephone Encounter (Signed)
Elise : I gave this to you 08/26/2016  Dr. Brand Males, M.D., [facility].C.P Pulmonary and Critical Care Medicine Staff Physician [place] [facility] Pager: [phone], If no answer or between  15:00h - 7:00h: call [phone]  08/26/2016 3:31 PM

## 2016-08-29 NOTE — Telephone Encounter (Signed)
These forms have been completed and faxed back. Nothing further needed. Will sign off.

## 2016-08-29 NOTE — Telephone Encounter (Signed)
Elsie please update.  Thanks

## 2016-08-31 DIAGNOSIS — J9620 Acute and chronic respiratory failure, unspecified whether with hypoxia or hypercapnia: Secondary | ICD-10-CM | POA: Diagnosis not present

## 2016-08-31 DIAGNOSIS — I10 Essential (primary) hypertension: Secondary | ICD-10-CM | POA: Diagnosis not present

## 2016-08-31 DIAGNOSIS — R634 Abnormal weight loss: Secondary | ICD-10-CM | POA: Diagnosis not present

## 2016-08-31 DIAGNOSIS — J449 Chronic obstructive pulmonary disease, unspecified: Secondary | ICD-10-CM | POA: Diagnosis not present

## 2016-08-31 DIAGNOSIS — R630 Anorexia: Secondary | ICD-10-CM | POA: Diagnosis not present

## 2016-08-31 DIAGNOSIS — J209 Acute bronchitis, unspecified: Secondary | ICD-10-CM | POA: Diagnosis not present

## 2016-09-02 DIAGNOSIS — J449 Chronic obstructive pulmonary disease, unspecified: Secondary | ICD-10-CM | POA: Diagnosis not present

## 2016-09-02 DIAGNOSIS — R634 Abnormal weight loss: Secondary | ICD-10-CM | POA: Diagnosis not present

## 2016-09-02 DIAGNOSIS — R630 Anorexia: Secondary | ICD-10-CM | POA: Diagnosis not present

## 2016-09-02 DIAGNOSIS — J209 Acute bronchitis, unspecified: Secondary | ICD-10-CM | POA: Diagnosis not present

## 2016-09-02 DIAGNOSIS — J9620 Acute and chronic respiratory failure, unspecified whether with hypoxia or hypercapnia: Secondary | ICD-10-CM | POA: Diagnosis not present

## 2016-09-02 DIAGNOSIS — I10 Essential (primary) hypertension: Secondary | ICD-10-CM | POA: Diagnosis not present

## 2016-09-05 DIAGNOSIS — J209 Acute bronchitis, unspecified: Secondary | ICD-10-CM | POA: Diagnosis not present

## 2016-09-05 DIAGNOSIS — J9620 Acute and chronic respiratory failure, unspecified whether with hypoxia or hypercapnia: Secondary | ICD-10-CM | POA: Diagnosis not present

## 2016-09-05 DIAGNOSIS — J449 Chronic obstructive pulmonary disease, unspecified: Secondary | ICD-10-CM | POA: Diagnosis not present

## 2016-09-05 DIAGNOSIS — I10 Essential (primary) hypertension: Secondary | ICD-10-CM | POA: Diagnosis not present

## 2016-09-05 DIAGNOSIS — R634 Abnormal weight loss: Secondary | ICD-10-CM | POA: Diagnosis not present

## 2016-09-05 DIAGNOSIS — R630 Anorexia: Secondary | ICD-10-CM | POA: Diagnosis not present

## 2016-09-07 DIAGNOSIS — R630 Anorexia: Secondary | ICD-10-CM | POA: Diagnosis not present

## 2016-09-07 DIAGNOSIS — J449 Chronic obstructive pulmonary disease, unspecified: Secondary | ICD-10-CM | POA: Diagnosis not present

## 2016-09-07 DIAGNOSIS — J9620 Acute and chronic respiratory failure, unspecified whether with hypoxia or hypercapnia: Secondary | ICD-10-CM | POA: Diagnosis not present

## 2016-09-07 DIAGNOSIS — R634 Abnormal weight loss: Secondary | ICD-10-CM | POA: Diagnosis not present

## 2016-09-07 DIAGNOSIS — I10 Essential (primary) hypertension: Secondary | ICD-10-CM | POA: Diagnosis not present

## 2016-09-07 DIAGNOSIS — J209 Acute bronchitis, unspecified: Secondary | ICD-10-CM | POA: Diagnosis not present

## 2016-09-09 DIAGNOSIS — J449 Chronic obstructive pulmonary disease, unspecified: Secondary | ICD-10-CM | POA: Diagnosis not present

## 2016-09-09 DIAGNOSIS — R634 Abnormal weight loss: Secondary | ICD-10-CM | POA: Diagnosis not present

## 2016-09-09 DIAGNOSIS — J209 Acute bronchitis, unspecified: Secondary | ICD-10-CM | POA: Diagnosis not present

## 2016-09-09 DIAGNOSIS — I10 Essential (primary) hypertension: Secondary | ICD-10-CM | POA: Diagnosis not present

## 2016-09-09 DIAGNOSIS — R630 Anorexia: Secondary | ICD-10-CM | POA: Diagnosis not present

## 2016-09-09 DIAGNOSIS — J9620 Acute and chronic respiratory failure, unspecified whether with hypoxia or hypercapnia: Secondary | ICD-10-CM | POA: Diagnosis not present

## 2016-09-14 DIAGNOSIS — I10 Essential (primary) hypertension: Secondary | ICD-10-CM | POA: Diagnosis not present

## 2016-09-14 DIAGNOSIS — J9620 Acute and chronic respiratory failure, unspecified whether with hypoxia or hypercapnia: Secondary | ICD-10-CM | POA: Diagnosis not present

## 2016-09-14 DIAGNOSIS — J209 Acute bronchitis, unspecified: Secondary | ICD-10-CM | POA: Diagnosis not present

## 2016-09-14 DIAGNOSIS — R634 Abnormal weight loss: Secondary | ICD-10-CM | POA: Diagnosis not present

## 2016-09-14 DIAGNOSIS — J449 Chronic obstructive pulmonary disease, unspecified: Secondary | ICD-10-CM | POA: Diagnosis not present

## 2016-09-14 DIAGNOSIS — R630 Anorexia: Secondary | ICD-10-CM | POA: Diagnosis not present

## 2016-09-16 DIAGNOSIS — R630 Anorexia: Secondary | ICD-10-CM | POA: Diagnosis not present

## 2016-09-16 DIAGNOSIS — R634 Abnormal weight loss: Secondary | ICD-10-CM | POA: Diagnosis not present

## 2016-09-16 DIAGNOSIS — J9620 Acute and chronic respiratory failure, unspecified whether with hypoxia or hypercapnia: Secondary | ICD-10-CM | POA: Diagnosis not present

## 2016-09-16 DIAGNOSIS — J449 Chronic obstructive pulmonary disease, unspecified: Secondary | ICD-10-CM | POA: Diagnosis not present

## 2016-09-16 DIAGNOSIS — I10 Essential (primary) hypertension: Secondary | ICD-10-CM | POA: Diagnosis not present

## 2016-09-16 DIAGNOSIS — J209 Acute bronchitis, unspecified: Secondary | ICD-10-CM | POA: Diagnosis not present

## 2016-09-18 DIAGNOSIS — I10 Essential (primary) hypertension: Secondary | ICD-10-CM | POA: Diagnosis not present

## 2016-09-18 DIAGNOSIS — J449 Chronic obstructive pulmonary disease, unspecified: Secondary | ICD-10-CM | POA: Diagnosis not present

## 2016-09-18 DIAGNOSIS — R634 Abnormal weight loss: Secondary | ICD-10-CM | POA: Diagnosis not present

## 2016-09-18 DIAGNOSIS — J209 Acute bronchitis, unspecified: Secondary | ICD-10-CM | POA: Diagnosis not present

## 2016-09-18 DIAGNOSIS — R630 Anorexia: Secondary | ICD-10-CM | POA: Diagnosis not present

## 2016-09-18 DIAGNOSIS — J9620 Acute and chronic respiratory failure, unspecified whether with hypoxia or hypercapnia: Secondary | ICD-10-CM | POA: Diagnosis not present

## 2016-09-23 ENCOUNTER — Telehealth: Payer: Self-pay | Admitting: Internal Medicine

## 2016-09-23 DIAGNOSIS — R634 Abnormal weight loss: Secondary | ICD-10-CM | POA: Diagnosis not present

## 2016-09-23 DIAGNOSIS — J449 Chronic obstructive pulmonary disease, unspecified: Secondary | ICD-10-CM | POA: Diagnosis not present

## 2016-09-23 DIAGNOSIS — I10 Essential (primary) hypertension: Secondary | ICD-10-CM | POA: Diagnosis not present

## 2016-09-23 DIAGNOSIS — R630 Anorexia: Secondary | ICD-10-CM | POA: Diagnosis not present

## 2016-09-23 DIAGNOSIS — J9620 Acute and chronic respiratory failure, unspecified whether with hypoxia or hypercapnia: Secondary | ICD-10-CM | POA: Diagnosis not present

## 2016-09-23 DIAGNOSIS — J209 Acute bronchitis, unspecified: Secondary | ICD-10-CM | POA: Diagnosis not present

## 2016-09-23 MED ORDER — FUROSEMIDE 20 MG PO TABS: 10.0000 mg | ORAL_TABLET | Freq: Every day | ORAL | 0 refills | Status: DC

## 2016-09-23 NOTE — Telephone Encounter (Signed)
Spoke to [place]. She stated that the patient needed a refill on her Lasix 20mg . Wants to use [facility] on [address].  Medication has been sent in. She verbalized understanding. Nothing further needed.

## 2016-09-23 NOTE — Telephone Encounter (Signed)
lmomtcb x 1 for Sierra Warner with Hospice.

## 2016-09-25 DIAGNOSIS — I10 Essential (primary) hypertension: Secondary | ICD-10-CM | POA: Diagnosis not present

## 2016-09-25 DIAGNOSIS — J449 Chronic obstructive pulmonary disease, unspecified: Secondary | ICD-10-CM | POA: Diagnosis not present

## 2016-09-25 DIAGNOSIS — R634 Abnormal weight loss: Secondary | ICD-10-CM | POA: Diagnosis not present

## 2016-09-25 DIAGNOSIS — J9620 Acute and chronic respiratory failure, unspecified whether with hypoxia or hypercapnia: Secondary | ICD-10-CM | POA: Diagnosis not present

## 2016-09-25 DIAGNOSIS — R630 Anorexia: Secondary | ICD-10-CM | POA: Diagnosis not present

## 2016-09-25 DIAGNOSIS — J209 Acute bronchitis, unspecified: Secondary | ICD-10-CM | POA: Diagnosis not present

## 2016-09-26 DIAGNOSIS — J449 Chronic obstructive pulmonary disease, unspecified: Secondary | ICD-10-CM | POA: Diagnosis not present

## 2016-09-26 DIAGNOSIS — I10 Essential (primary) hypertension: Secondary | ICD-10-CM | POA: Diagnosis not present

## 2016-09-26 DIAGNOSIS — J9620 Acute and chronic respiratory failure, unspecified whether with hypoxia or hypercapnia: Secondary | ICD-10-CM | POA: Diagnosis not present

## 2016-09-26 DIAGNOSIS — R630 Anorexia: Secondary | ICD-10-CM | POA: Diagnosis not present

## 2016-09-26 DIAGNOSIS — J209 Acute bronchitis, unspecified: Secondary | ICD-10-CM | POA: Diagnosis not present

## 2016-09-26 DIAGNOSIS — R634 Abnormal weight loss: Secondary | ICD-10-CM | POA: Diagnosis not present

## 2016-09-28 DIAGNOSIS — J209 Acute bronchitis, unspecified: Secondary | ICD-10-CM | POA: Diagnosis not present

## 2016-09-28 DIAGNOSIS — R634 Abnormal weight loss: Secondary | ICD-10-CM | POA: Diagnosis not present

## 2016-09-28 DIAGNOSIS — J9620 Acute and chronic respiratory failure, unspecified whether with hypoxia or hypercapnia: Secondary | ICD-10-CM | POA: Diagnosis not present

## 2016-09-28 DIAGNOSIS — I10 Essential (primary) hypertension: Secondary | ICD-10-CM | POA: Diagnosis not present

## 2016-09-28 DIAGNOSIS — J449 Chronic obstructive pulmonary disease, unspecified: Secondary | ICD-10-CM | POA: Diagnosis not present

## 2016-09-28 DIAGNOSIS — R630 Anorexia: Secondary | ICD-10-CM | POA: Diagnosis not present

## 2016-09-30 DIAGNOSIS — J449 Chronic obstructive pulmonary disease, unspecified: Secondary | ICD-10-CM | POA: Diagnosis not present

## 2016-09-30 DIAGNOSIS — J209 Acute bronchitis, unspecified: Secondary | ICD-10-CM | POA: Diagnosis not present

## 2016-09-30 DIAGNOSIS — R630 Anorexia: Secondary | ICD-10-CM | POA: Diagnosis not present

## 2016-09-30 DIAGNOSIS — J9620 Acute and chronic respiratory failure, unspecified whether with hypoxia or hypercapnia: Secondary | ICD-10-CM | POA: Diagnosis not present

## 2016-09-30 DIAGNOSIS — I10 Essential (primary) hypertension: Secondary | ICD-10-CM | POA: Diagnosis not present

## 2016-09-30 DIAGNOSIS — R634 Abnormal weight loss: Secondary | ICD-10-CM | POA: Diagnosis not present

## 2016-10-05 DIAGNOSIS — R630 Anorexia: Secondary | ICD-10-CM | POA: Diagnosis not present

## 2016-10-05 DIAGNOSIS — J9620 Acute and chronic respiratory failure, unspecified whether with hypoxia or hypercapnia: Secondary | ICD-10-CM | POA: Diagnosis not present

## 2016-10-05 DIAGNOSIS — J209 Acute bronchitis, unspecified: Secondary | ICD-10-CM | POA: Diagnosis not present

## 2016-10-05 DIAGNOSIS — I10 Essential (primary) hypertension: Secondary | ICD-10-CM | POA: Diagnosis not present

## 2016-10-05 DIAGNOSIS — R634 Abnormal weight loss: Secondary | ICD-10-CM | POA: Diagnosis not present

## 2016-10-05 DIAGNOSIS — J449 Chronic obstructive pulmonary disease, unspecified: Secondary | ICD-10-CM | POA: Diagnosis not present

## 2016-10-07 DIAGNOSIS — R630 Anorexia: Secondary | ICD-10-CM | POA: Diagnosis not present

## 2016-10-07 DIAGNOSIS — J9620 Acute and chronic respiratory failure, unspecified whether with hypoxia or hypercapnia: Secondary | ICD-10-CM | POA: Diagnosis not present

## 2016-10-07 DIAGNOSIS — J209 Acute bronchitis, unspecified: Secondary | ICD-10-CM | POA: Diagnosis not present

## 2016-10-07 DIAGNOSIS — R634 Abnormal weight loss: Secondary | ICD-10-CM | POA: Diagnosis not present

## 2016-10-07 DIAGNOSIS — I10 Essential (primary) hypertension: Secondary | ICD-10-CM | POA: Diagnosis not present

## 2016-10-07 DIAGNOSIS — J449 Chronic obstructive pulmonary disease, unspecified: Secondary | ICD-10-CM | POA: Diagnosis not present

## 2016-10-10 ENCOUNTER — Telehealth: Payer: Self-pay | Admitting: Internal Medicine

## 2016-10-10 NOTE — Telephone Encounter (Signed)
Called and spoke with pt. Pt states she was started on Losartan 100mg  back in 05/2016 when pt was in the hospital and was taken off Losartan-HCTZ 100-12.5mg . Pt states it is time to renew her Losartan 100mg  but she already has 2 months of the Losartan-HCTZ and was questioning if she could use this and not renew the Losartan 100mg . Advised pt that when she was admitted back in 05/2016 it stated in her d/c note that she does not have CHF. Pt also states her pressure typically runs in the 836O systolic, and her pressure is taken weekly by Hospice. Advised pt that she may not need the Losartan-HCTZ and just needs the Losartan 100mg . Pt states she does not have a PCP nor a Cardiologist.   MR please advise. Thanks.

## 2016-10-11 NOTE — Telephone Encounter (Signed)
Ok to take the losartan-hctz for 2 months and then switch to losartan 100mg   Dr. Brand Males, M.D., [facility].C.P Pulmonary and Critical Care Medicine Staff Physician [place] [facility] Pager: [phone], If no answer or between  15:00h - 7:00h: call [phone]  10/11/2016 11:05 AM

## 2016-10-11 NOTE — Telephone Encounter (Signed)
Patient was busy at time of call. Will call back later.

## 2016-10-12 DIAGNOSIS — R630 Anorexia: Secondary | ICD-10-CM | POA: Diagnosis not present

## 2016-10-12 DIAGNOSIS — J9620 Acute and chronic respiratory failure, unspecified whether with hypoxia or hypercapnia: Secondary | ICD-10-CM | POA: Diagnosis not present

## 2016-10-12 DIAGNOSIS — R634 Abnormal weight loss: Secondary | ICD-10-CM | POA: Diagnosis not present

## 2016-10-12 DIAGNOSIS — I10 Essential (primary) hypertension: Secondary | ICD-10-CM | POA: Diagnosis not present

## 2016-10-12 DIAGNOSIS — J209 Acute bronchitis, unspecified: Secondary | ICD-10-CM | POA: Diagnosis not present

## 2016-10-12 DIAGNOSIS — J449 Chronic obstructive pulmonary disease, unspecified: Secondary | ICD-10-CM | POA: Diagnosis not present

## 2016-10-12 NOTE — Telephone Encounter (Signed)
Patient is returning phone call [phone]

## 2016-10-12 NOTE — Telephone Encounter (Signed)
Spoke with pt and made her aware of the message from MR. Pt understood and had no additional questions at this time. Nothing further is needed

## 2016-10-14 DIAGNOSIS — J209 Acute bronchitis, unspecified: Secondary | ICD-10-CM | POA: Diagnosis not present

## 2016-10-14 DIAGNOSIS — R630 Anorexia: Secondary | ICD-10-CM | POA: Diagnosis not present

## 2016-10-14 DIAGNOSIS — R634 Abnormal weight loss: Secondary | ICD-10-CM | POA: Diagnosis not present

## 2016-10-14 DIAGNOSIS — I10 Essential (primary) hypertension: Secondary | ICD-10-CM | POA: Diagnosis not present

## 2016-10-14 DIAGNOSIS — J9620 Acute and chronic respiratory failure, unspecified whether with hypoxia or hypercapnia: Secondary | ICD-10-CM | POA: Diagnosis not present

## 2016-10-14 DIAGNOSIS — J449 Chronic obstructive pulmonary disease, unspecified: Secondary | ICD-10-CM | POA: Diagnosis not present

## 2016-10-19 ENCOUNTER — Other Ambulatory Visit: Payer: Self-pay | Admitting: Internal Medicine

## 2016-10-19 DIAGNOSIS — R634 Abnormal weight loss: Secondary | ICD-10-CM | POA: Diagnosis not present

## 2016-10-19 DIAGNOSIS — J9620 Acute and chronic respiratory failure, unspecified whether with hypoxia or hypercapnia: Secondary | ICD-10-CM | POA: Diagnosis not present

## 2016-10-19 DIAGNOSIS — J449 Chronic obstructive pulmonary disease, unspecified: Secondary | ICD-10-CM | POA: Diagnosis not present

## 2016-10-19 DIAGNOSIS — J209 Acute bronchitis, unspecified: Secondary | ICD-10-CM | POA: Diagnosis not present

## 2016-10-19 DIAGNOSIS — I10 Essential (primary) hypertension: Secondary | ICD-10-CM | POA: Diagnosis not present

## 2016-10-19 DIAGNOSIS — R630 Anorexia: Secondary | ICD-10-CM | POA: Diagnosis not present

## 2016-10-21 DIAGNOSIS — R630 Anorexia: Secondary | ICD-10-CM | POA: Diagnosis not present

## 2016-10-21 DIAGNOSIS — J449 Chronic obstructive pulmonary disease, unspecified: Secondary | ICD-10-CM | POA: Diagnosis not present

## 2016-10-21 DIAGNOSIS — J209 Acute bronchitis, unspecified: Secondary | ICD-10-CM | POA: Diagnosis not present

## 2016-10-21 DIAGNOSIS — R634 Abnormal weight loss: Secondary | ICD-10-CM | POA: Diagnosis not present

## 2016-10-21 DIAGNOSIS — I10 Essential (primary) hypertension: Secondary | ICD-10-CM | POA: Diagnosis not present

## 2016-10-21 DIAGNOSIS — J9620 Acute and chronic respiratory failure, unspecified whether with hypoxia or hypercapnia: Secondary | ICD-10-CM | POA: Diagnosis not present

## 2016-10-24 DIAGNOSIS — R630 Anorexia: Secondary | ICD-10-CM | POA: Diagnosis not present

## 2016-10-24 DIAGNOSIS — J449 Chronic obstructive pulmonary disease, unspecified: Secondary | ICD-10-CM | POA: Diagnosis not present

## 2016-10-24 DIAGNOSIS — R634 Abnormal weight loss: Secondary | ICD-10-CM | POA: Diagnosis not present

## 2016-10-24 DIAGNOSIS — I10 Essential (primary) hypertension: Secondary | ICD-10-CM | POA: Diagnosis not present

## 2016-10-24 DIAGNOSIS — J209 Acute bronchitis, unspecified: Secondary | ICD-10-CM | POA: Diagnosis not present

## 2016-10-24 DIAGNOSIS — J9620 Acute and chronic respiratory failure, unspecified whether with hypoxia or hypercapnia: Secondary | ICD-10-CM | POA: Diagnosis not present

## 2016-10-26 DIAGNOSIS — J9620 Acute and chronic respiratory failure, unspecified whether with hypoxia or hypercapnia: Secondary | ICD-10-CM | POA: Diagnosis not present

## 2016-10-26 DIAGNOSIS — R630 Anorexia: Secondary | ICD-10-CM | POA: Diagnosis not present

## 2016-10-26 DIAGNOSIS — J449 Chronic obstructive pulmonary disease, unspecified: Secondary | ICD-10-CM | POA: Diagnosis not present

## 2016-10-26 DIAGNOSIS — R634 Abnormal weight loss: Secondary | ICD-10-CM | POA: Diagnosis not present

## 2016-10-26 DIAGNOSIS — I10 Essential (primary) hypertension: Secondary | ICD-10-CM | POA: Diagnosis not present

## 2016-10-26 DIAGNOSIS — J209 Acute bronchitis, unspecified: Secondary | ICD-10-CM | POA: Diagnosis not present

## 2016-10-28 DIAGNOSIS — R634 Abnormal weight loss: Secondary | ICD-10-CM | POA: Diagnosis not present

## 2016-10-28 DIAGNOSIS — R630 Anorexia: Secondary | ICD-10-CM | POA: Diagnosis not present

## 2016-10-28 DIAGNOSIS — J9620 Acute and chronic respiratory failure, unspecified whether with hypoxia or hypercapnia: Secondary | ICD-10-CM | POA: Diagnosis not present

## 2016-10-28 DIAGNOSIS — J209 Acute bronchitis, unspecified: Secondary | ICD-10-CM | POA: Diagnosis not present

## 2016-10-28 DIAGNOSIS — J449 Chronic obstructive pulmonary disease, unspecified: Secondary | ICD-10-CM | POA: Diagnosis not present

## 2016-10-28 DIAGNOSIS — I10 Essential (primary) hypertension: Secondary | ICD-10-CM | POA: Diagnosis not present

## 2016-11-03 DIAGNOSIS — R630 Anorexia: Secondary | ICD-10-CM | POA: Diagnosis not present

## 2016-11-03 DIAGNOSIS — J449 Chronic obstructive pulmonary disease, unspecified: Secondary | ICD-10-CM | POA: Diagnosis not present

## 2016-11-03 DIAGNOSIS — R634 Abnormal weight loss: Secondary | ICD-10-CM | POA: Diagnosis not present

## 2016-11-03 DIAGNOSIS — J9620 Acute and chronic respiratory failure, unspecified whether with hypoxia or hypercapnia: Secondary | ICD-10-CM | POA: Diagnosis not present

## 2016-11-03 DIAGNOSIS — J209 Acute bronchitis, unspecified: Secondary | ICD-10-CM | POA: Diagnosis not present

## 2016-11-03 DIAGNOSIS — I10 Essential (primary) hypertension: Secondary | ICD-10-CM | POA: Diagnosis not present

## 2016-11-04 DIAGNOSIS — J449 Chronic obstructive pulmonary disease, unspecified: Secondary | ICD-10-CM | POA: Diagnosis not present

## 2016-11-04 DIAGNOSIS — J9620 Acute and chronic respiratory failure, unspecified whether with hypoxia or hypercapnia: Secondary | ICD-10-CM | POA: Diagnosis not present

## 2016-11-04 DIAGNOSIS — J209 Acute bronchitis, unspecified: Secondary | ICD-10-CM | POA: Diagnosis not present

## 2016-11-04 DIAGNOSIS — R630 Anorexia: Secondary | ICD-10-CM | POA: Diagnosis not present

## 2016-11-04 DIAGNOSIS — I10 Essential (primary) hypertension: Secondary | ICD-10-CM | POA: Diagnosis not present

## 2016-11-04 DIAGNOSIS — R634 Abnormal weight loss: Secondary | ICD-10-CM | POA: Diagnosis not present

## 2016-11-09 ENCOUNTER — Telehealth: Payer: Self-pay | Admitting: Internal Medicine

## 2016-11-09 DIAGNOSIS — R630 Anorexia: Secondary | ICD-10-CM | POA: Diagnosis not present

## 2016-11-09 DIAGNOSIS — R634 Abnormal weight loss: Secondary | ICD-10-CM | POA: Diagnosis not present

## 2016-11-09 DIAGNOSIS — J209 Acute bronchitis, unspecified: Secondary | ICD-10-CM | POA: Diagnosis not present

## 2016-11-09 DIAGNOSIS — J9620 Acute and chronic respiratory failure, unspecified whether with hypoxia or hypercapnia: Secondary | ICD-10-CM | POA: Diagnosis not present

## 2016-11-09 DIAGNOSIS — I10 Essential (primary) hypertension: Secondary | ICD-10-CM | POA: Diagnosis not present

## 2016-11-09 DIAGNOSIS — J449 Chronic obstructive pulmonary disease, unspecified: Secondary | ICD-10-CM | POA: Diagnosis not present

## 2016-11-09 MED ORDER — FUROSEMIDE 20 MG PO TABS: 10.0000 mg | ORAL_TABLET | Freq: Every day | ORAL | 0 refills | Status: DC

## 2016-11-09 NOTE — Telephone Encounter (Signed)
Spoke with Ria Comment. She was requesting a refill on pt's furosemide 20mg . Advised RX will be sent to [facility] on [address]. She verbalized understanding. Nothing else was needed at time of call.

## 2016-11-11 DIAGNOSIS — J449 Chronic obstructive pulmonary disease, unspecified: Secondary | ICD-10-CM | POA: Diagnosis not present

## 2016-11-11 DIAGNOSIS — R634 Abnormal weight loss: Secondary | ICD-10-CM | POA: Diagnosis not present

## 2016-11-11 DIAGNOSIS — R630 Anorexia: Secondary | ICD-10-CM | POA: Diagnosis not present

## 2016-11-11 DIAGNOSIS — I10 Essential (primary) hypertension: Secondary | ICD-10-CM | POA: Diagnosis not present

## 2016-11-11 DIAGNOSIS — J209 Acute bronchitis, unspecified: Secondary | ICD-10-CM | POA: Diagnosis not present

## 2016-11-11 DIAGNOSIS — J9620 Acute and chronic respiratory failure, unspecified whether with hypoxia or hypercapnia: Secondary | ICD-10-CM | POA: Diagnosis not present

## 2016-11-14 ENCOUNTER — Telehealth: Payer: Self-pay | Admitting: Internal Medicine

## 2016-11-14 MED ORDER — LOSARTAN POTASSIUM 100 MG PO TABS: 100.0000 mg | ORAL_TABLET | Freq: Every day | ORAL | 3 refills | Status: DC

## 2016-11-14 NOTE — Telephone Encounter (Signed)
Spoke with patient. She stated that she needed to have a RX sent in for her losartan 100mg  to  [facility] on [address]. Advised patient that I will send in the RX. Nothing else needed at time of call.

## 2016-11-16 DIAGNOSIS — I10 Essential (primary) hypertension: Secondary | ICD-10-CM | POA: Diagnosis not present

## 2016-11-16 DIAGNOSIS — J449 Chronic obstructive pulmonary disease, unspecified: Secondary | ICD-10-CM | POA: Diagnosis not present

## 2016-11-16 DIAGNOSIS — J9620 Acute and chronic respiratory failure, unspecified whether with hypoxia or hypercapnia: Secondary | ICD-10-CM | POA: Diagnosis not present

## 2016-11-16 DIAGNOSIS — J209 Acute bronchitis, unspecified: Secondary | ICD-10-CM | POA: Diagnosis not present

## 2016-11-16 DIAGNOSIS — R630 Anorexia: Secondary | ICD-10-CM | POA: Diagnosis not present

## 2016-11-16 DIAGNOSIS — R634 Abnormal weight loss: Secondary | ICD-10-CM | POA: Diagnosis not present

## 2016-11-19 DIAGNOSIS — J9620 Acute and chronic respiratory failure, unspecified whether with hypoxia or hypercapnia: Secondary | ICD-10-CM | POA: Diagnosis not present

## 2016-11-19 DIAGNOSIS — R634 Abnormal weight loss: Secondary | ICD-10-CM | POA: Diagnosis not present

## 2016-11-19 DIAGNOSIS — R630 Anorexia: Secondary | ICD-10-CM | POA: Diagnosis not present

## 2016-11-19 DIAGNOSIS — J449 Chronic obstructive pulmonary disease, unspecified: Secondary | ICD-10-CM | POA: Diagnosis not present

## 2016-11-19 DIAGNOSIS — J209 Acute bronchitis, unspecified: Secondary | ICD-10-CM | POA: Diagnosis not present

## 2016-11-19 DIAGNOSIS — I10 Essential (primary) hypertension: Secondary | ICD-10-CM | POA: Diagnosis not present

## 2016-11-23 ENCOUNTER — Encounter: Payer: Self-pay | Admitting: Internal Medicine

## 2016-11-23 ENCOUNTER — Ambulatory Visit (INDEPENDENT_AMBULATORY_CARE_PROVIDER_SITE_OTHER): Payer: Medicare Other | Admitting: Internal Medicine

## 2016-11-23 VITALS — BP 130/62 | HR 93 | Ht 61.0 in | Wt 125.0 lb

## 2016-11-23 DIAGNOSIS — Z23 Encounter for immunization: Secondary | ICD-10-CM | POA: Diagnosis not present

## 2016-11-23 DIAGNOSIS — J449 Chronic obstructive pulmonary disease, unspecified: Secondary | ICD-10-CM

## 2016-11-23 DIAGNOSIS — Z515 Encounter for palliative care: Secondary | ICD-10-CM

## 2016-11-23 DIAGNOSIS — J9611 Chronic respiratory failure with hypoxia: Secondary | ICD-10-CM | POA: Diagnosis not present

## 2016-11-23 NOTE — Patient Instructions (Addendum)
    ICD-10-CM   1. Stage 4 very severe COPD by GOLD classification ([place]) J44.9   2. Chronic respiratory failure with hypoxia (HCC) J96.11   3. Hospice care patient Z51.5     Stable copd  Plan - continue hospice care as long as they think you will qualify - continue nebs - sample trelegy to take daily - during this time do nebs only as needed - high dose flu shot 11/23/2016  - you do not need o2 at rest in day time but need with exertion - if night time o2 is not subjectively helping you, you can return it  followup 3 months with APP or DR [name] or sooner if needed

## 2016-11-23 NOTE — Progress Notes (Signed)
Subjective:     Patient ID: Sierra Warner, female   DOB: 18-Sep-1928, 83 y.o.   MRN: [id number]  HPI OV 08/23/2016  Chief Complaint  Patient presents with  . Follow-up    Pt states her breathing is unchanged since last OV. Pt c/o cough with little mucus production. Pt denies CP/tightness and f/c/s.   83 year old female Gold stage IV COPD. She is now under the care of hospice which is paradoxically of the functional status. She is driving. She is on oxygen. She is on nebulizers. Her new complaint is that she's got bilateral pedal edema worse in the daytime as the day progresses. Better with night and at rest. She is unaware she has bilateral varicose veins. Initially she was reluctant to take hospice a few months ago but now she says she's loving it because of the extra layer of supportive provided. She is now worried that she will live past 6 months expectancy and she will "fired" from the hospice. Nevertheless have told her that with her advanced COPD the prognosis is less than 6 months   OV 11/23/2016  Chief Complaint  Patient presents with  . Follow-up    Pt states her breathing is unchanged since last OV. Pt c/o mild prod cough with little mucus production - white in color. Pt denies CP/tightness and f/c/s.    Advanced COPD on home hospice  She feels hospice is working really well for her and has improved her functional status. She is worried that her hospice services might be terminated because of her good functional status. She is on nebulizers. She prefers inhalers and is asking for samples. She says the only thing frustrating about hospice is that just be on nebulizers. Nevertheless overall program has helped her. She will have flu shot today. Walk walking desaturation test on an 185 feet 3 laps on room air: She only did 2 laps and stopped because of dyspnea but more so leg pain. She started off at a pulse rate of 91/m final heart rate was similar. Resting pulse ox was 91%. Final pulse ox was  88% at end of second lap   has a past medical history of Atrophic vaginitis; COPD (chronic obstructive pulmonary disease) ([place]); COPD, mild ([place]); Hemorrhoids; Hypertension; Microscopic hematuria; and Osteopenia.   reports that she quit smoking about 9 years ago. Her smoking use included Cigarettes. She has a 13.50 pack-year smoking history. She has never used smokeless tobacco.  Past Surgical History:  Procedure Laterality Date  . APPENDECTOMY  1933  . CATARACT SURGERY    . CYSTOSCOPY    . THYROIDECTOMY, PARTIAL    . TONSILLECTOMY      Allergies  Allergen Reactions  . Sulfa Antibiotics Hives and Rash    Immunization History  Administered Date(s) Administered  . Influenza Split 12/14/2011, 12/18/2012  . Influenza Whole 12/20/2010  . Influenza, High Dose Seasonal PF 12/23/2015  . Influenza,inj,Quad PF,6+ Mos 11/26/2013, 12/20/2014  . Pneumococcal Conjugate-13 02/26/2013  . Pneumococcal Polysaccharide-23 10/13/2014    Family History  Problem Relation Age of Onset  . Diabetes Mother   . Hypertension Mother   . Heart disease Father   . Hypertension Father   . Heart failure Father      Current Outpatient Prescriptions:  .  ALPRAZolam (XANAX) 0.25 MG tablet, Take 1 tablet (0.25 mg total) by mouth 3 (three) times daily as needed for anxiety., Disp: 12 tablet, Rfl: 0 .  Calcium Carbonate-Vitamin D (CALCIUM + D PO), Take by mouth  2 (two) times daily.  , Disp: , Rfl:  .  cholecalciferol (VITAMIN D) 1000 UNITS tablet, Take 1,000 Units by mouth daily., Disp: , Rfl:  .  COMBIVENT RESPIMAT 20-100 MCG/ACT AERS respimat, Inhale 1 puff into the lungs every 6 (six) hours as needed for wheezing or shortness of breath. , Disp: , Rfl:  .  furosemide (LASIX) 20 MG tablet, Take 0.5 tablets (10 mg total) by mouth daily., Disp: 15 tablet, Rfl: 0 .  ipratropium-albuterol (DUONEB) 0.5-2.5 (3) MG/3ML SOLN, Take 3 mLs by nebulization 4 (four) times daily. And as needed, Disp: 360 mL, Rfl: 11 .   losartan (COZAAR) 100 MG tablet, Take 1 tablet (100 mg total) by mouth daily., Disp: 30 tablet, Rfl: 3 .  Multiple Vitamins-Minerals (MULTIVITAMIN GUMMIES ADULT PO), Take 1 tablet by mouth daily. , Disp: , Rfl:  .  Polyethylene Glycol 3350 (MIRALAX PO), Take 1 packet by mouth daily. , Disp: , Rfl:  .  BREO ELLIPTA 100-25 MCG/INH AEPB, INL 1 PUFF PO ONCE  A DAY, Disp: , Rfl: 0 .  SPIRIVA HANDIHALER 18 MCG inhalation capsule, INHALE THE CONTENTS OF 1 CAP VIA HANDIHALER PO ONCE DAILY, Disp: , Rfl: 6    Review of Systems     Objective:   Physical Exam  Constitutional: She is oriented to person, place, and time. She appears well-developed and well-nourished. No distress.  HENT:  Head: Normocephalic and atraumatic.  Right Ear: External ear normal.  Left Ear: External ear normal.  Mouth/Throat: Oropharynx is clear and moist. No oropharyngeal exudate.  Eyes: Pupils are equal, round, and reactive to light. Conjunctivae and EOM are normal. Right eye exhibits no discharge. Left eye exhibits no discharge. No scleral icterus.  Neck: Normal range of motion. Neck supple. No JVD present. No tracheal deviation present. No thyromegaly present.  Cardiovascular: Normal rate, regular rhythm, normal heart sounds and intact distal pulses.  Exam reveals no gallop and no friction rub.   No murmur heard. Pulmonary/Chest: Effort normal and breath sounds normal. No respiratory distress. She has no wheezes. She has no rales. She exhibits no tenderness.  Mild purse lip berathing only  Abdominal: Soft. Bowel sounds are normal. She exhibits no distension and no mass. There is no tenderness. There is no rebound and no guarding.  Musculoskeletal: Normal range of motion. She exhibits no edema or tenderness.  Lymphadenopathy:    She has no cervical adenopathy.  Neurological: She is alert and oriented to person, place, and time. She has normal reflexes. No cranial nerve deficit. She exhibits normal muscle tone. Coordination  normal.  Skin: Skin is warm and dry. No rash noted. She is not diaphoretic. No erythema. No pallor.  Psychiatric: She has a normal mood and affect. Her behavior is normal. Judgment and thought content normal.  Vitals reviewed.  Vitals:   11/23/16 1141  BP: 130/62  Pulse: 93  SpO2: 96%  Weight: 125 lb (56.7 kg)  Height: 5\' 1"  (1.549 m)   91% Room Air at rest   Estimated body mass index is 23.62 kg/m as calculated from the following:   Height as of this encounter: 5\' 1"  (1.549 m).   Weight as of this encounter: 125 lb (56.7 kg).     Assessment:       ICD-10-CM   1. Stage 4 very severe COPD by GOLD classification ([place]) J44.9   2. Chronic respiratory failure with hypoxia (HCC) J96.11   3. Hospice care patient Z51.5  Plan:        Stable copd  Plan - continue hospice care as long as they think you will qualify - continue nebs - sample trelegy to take daily - during this time do nebs only as needed - high dose flu shot 11/23/2016  - you do not need o2 at rest in day time but need with exertion - if night time o2 is not subjectively helping you, you can return it  followup 3 months with APP or DR [name] or sooner if needed   Dr. Brand Males, M.D., [facility].C.P Pulmonary and Critical Care Medicine Staff Physician [place] [facility] Pager: [phone], If no answer or between  15:00h - 7:00h: call [phone]  11/23/2016 12:17 PM

## 2016-11-24 DIAGNOSIS — I10 Essential (primary) hypertension: Secondary | ICD-10-CM | POA: Diagnosis not present

## 2016-11-24 DIAGNOSIS — R634 Abnormal weight loss: Secondary | ICD-10-CM | POA: Diagnosis not present

## 2016-11-24 DIAGNOSIS — R630 Anorexia: Secondary | ICD-10-CM | POA: Diagnosis not present

## 2016-11-24 DIAGNOSIS — J9620 Acute and chronic respiratory failure, unspecified whether with hypoxia or hypercapnia: Secondary | ICD-10-CM | POA: Diagnosis not present

## 2016-11-24 DIAGNOSIS — J209 Acute bronchitis, unspecified: Secondary | ICD-10-CM | POA: Diagnosis not present

## 2016-11-24 DIAGNOSIS — J449 Chronic obstructive pulmonary disease, unspecified: Secondary | ICD-10-CM | POA: Diagnosis not present

## 2016-11-25 DIAGNOSIS — R634 Abnormal weight loss: Secondary | ICD-10-CM | POA: Diagnosis not present

## 2016-11-25 DIAGNOSIS — J449 Chronic obstructive pulmonary disease, unspecified: Secondary | ICD-10-CM | POA: Diagnosis not present

## 2016-11-25 DIAGNOSIS — R630 Anorexia: Secondary | ICD-10-CM | POA: Diagnosis not present

## 2016-11-25 DIAGNOSIS — J209 Acute bronchitis, unspecified: Secondary | ICD-10-CM | POA: Diagnosis not present

## 2016-11-25 DIAGNOSIS — I10 Essential (primary) hypertension: Secondary | ICD-10-CM | POA: Diagnosis not present

## 2016-11-25 DIAGNOSIS — J9620 Acute and chronic respiratory failure, unspecified whether with hypoxia or hypercapnia: Secondary | ICD-10-CM | POA: Diagnosis not present

## 2016-11-28 DIAGNOSIS — R630 Anorexia: Secondary | ICD-10-CM | POA: Diagnosis not present

## 2016-11-28 DIAGNOSIS — J209 Acute bronchitis, unspecified: Secondary | ICD-10-CM | POA: Diagnosis not present

## 2016-11-28 DIAGNOSIS — I10 Essential (primary) hypertension: Secondary | ICD-10-CM | POA: Diagnosis not present

## 2016-11-28 DIAGNOSIS — R634 Abnormal weight loss: Secondary | ICD-10-CM | POA: Diagnosis not present

## 2016-11-28 DIAGNOSIS — J449 Chronic obstructive pulmonary disease, unspecified: Secondary | ICD-10-CM | POA: Diagnosis not present

## 2016-11-28 DIAGNOSIS — J9620 Acute and chronic respiratory failure, unspecified whether with hypoxia or hypercapnia: Secondary | ICD-10-CM | POA: Diagnosis not present

## 2016-11-30 DIAGNOSIS — R634 Abnormal weight loss: Secondary | ICD-10-CM | POA: Diagnosis not present

## 2016-11-30 DIAGNOSIS — I10 Essential (primary) hypertension: Secondary | ICD-10-CM | POA: Diagnosis not present

## 2016-11-30 DIAGNOSIS — J449 Chronic obstructive pulmonary disease, unspecified: Secondary | ICD-10-CM | POA: Diagnosis not present

## 2016-11-30 DIAGNOSIS — J209 Acute bronchitis, unspecified: Secondary | ICD-10-CM | POA: Diagnosis not present

## 2016-11-30 DIAGNOSIS — J9620 Acute and chronic respiratory failure, unspecified whether with hypoxia or hypercapnia: Secondary | ICD-10-CM | POA: Diagnosis not present

## 2016-11-30 DIAGNOSIS — R630 Anorexia: Secondary | ICD-10-CM | POA: Diagnosis not present

## 2016-12-02 DIAGNOSIS — J209 Acute bronchitis, unspecified: Secondary | ICD-10-CM | POA: Diagnosis not present

## 2016-12-02 DIAGNOSIS — J449 Chronic obstructive pulmonary disease, unspecified: Secondary | ICD-10-CM | POA: Diagnosis not present

## 2016-12-02 DIAGNOSIS — I10 Essential (primary) hypertension: Secondary | ICD-10-CM | POA: Diagnosis not present

## 2016-12-02 DIAGNOSIS — J9620 Acute and chronic respiratory failure, unspecified whether with hypoxia or hypercapnia: Secondary | ICD-10-CM | POA: Diagnosis not present

## 2016-12-02 DIAGNOSIS — R630 Anorexia: Secondary | ICD-10-CM | POA: Diagnosis not present

## 2016-12-02 DIAGNOSIS — R634 Abnormal weight loss: Secondary | ICD-10-CM | POA: Diagnosis not present

## 2016-12-06 ENCOUNTER — Telehealth: Payer: Self-pay | Admitting: Internal Medicine

## 2016-12-06 MED ORDER — FLUTICASONE-UMECLIDIN-VILANT 100-62.5-25 MCG/INH IN AEPB: 1.0000 | INHALATION_SPRAY | Freq: Every day | RESPIRATORY_TRACT | 4 refills | Status: DC

## 2016-12-06 NOTE — Telephone Encounter (Signed)
Spoke with patient. She wanted to see if Dr. Lyman Speller had ever gotten in touch with MR. She also wanted to let MR know that Trelegy has really helped her and would like for a RX to sent in to [facility] on [address].   Advised patient to call back if the Trelegy is too much at the pharmacy and we could help her get her medication through [facility]. She verbalized understanding.   MR, please advise if you have heard anything from Dr. Lyman Speller. Thanks!

## 2016-12-08 NOTE — Telephone Encounter (Signed)
MR please advise. Thanks! 

## 2016-12-09 DIAGNOSIS — J9620 Acute and chronic respiratory failure, unspecified whether with hypoxia or hypercapnia: Secondary | ICD-10-CM | POA: Diagnosis not present

## 2016-12-09 DIAGNOSIS — R630 Anorexia: Secondary | ICD-10-CM | POA: Diagnosis not present

## 2016-12-09 DIAGNOSIS — J449 Chronic obstructive pulmonary disease, unspecified: Secondary | ICD-10-CM | POA: Diagnosis not present

## 2016-12-09 DIAGNOSIS — J209 Acute bronchitis, unspecified: Secondary | ICD-10-CM | POA: Diagnosis not present

## 2016-12-09 DIAGNOSIS — R634 Abnormal weight loss: Secondary | ICD-10-CM | POA: Diagnosis not present

## 2016-12-09 DIAGNOSIS — I10 Essential (primary) hypertension: Secondary | ICD-10-CM | POA: Diagnosis not present

## 2016-12-11 ENCOUNTER — Other Ambulatory Visit: Payer: Self-pay | Admitting: Internal Medicine

## 2016-12-14 ENCOUNTER — Telehealth: Payer: Self-pay | Admitting: Internal Medicine

## 2016-12-14 DIAGNOSIS — R630 Anorexia: Secondary | ICD-10-CM | POA: Diagnosis not present

## 2016-12-14 DIAGNOSIS — J9620 Acute and chronic respiratory failure, unspecified whether with hypoxia or hypercapnia: Secondary | ICD-10-CM | POA: Diagnosis not present

## 2016-12-14 DIAGNOSIS — J449 Chronic obstructive pulmonary disease, unspecified: Secondary | ICD-10-CM | POA: Diagnosis not present

## 2016-12-14 DIAGNOSIS — J209 Acute bronchitis, unspecified: Secondary | ICD-10-CM | POA: Diagnosis not present

## 2016-12-14 DIAGNOSIS — R634 Abnormal weight loss: Secondary | ICD-10-CM | POA: Diagnosis not present

## 2016-12-14 DIAGNOSIS — I10 Essential (primary) hypertension: Secondary | ICD-10-CM | POA: Diagnosis not present

## 2016-12-14 NOTE — Telephone Encounter (Signed)
Lm for Dr. Lyman Speller.

## 2016-12-14 NOTE — Telephone Encounter (Signed)
Yes d/w Dr Lyman Speller - patient functional status improved and driving car. So ok to dc from hospice. If she decompensates we can puth her back in hospice. If she is upset about this please make an appt for her to disccuss this   Dr. Brand Males, M.D., [facility].C.P Pulmonary and Critical Care Medicine Staff Physician [place] [facility] Pager: [phone], If no answer or between  15:00h - 7:00h: call [phone]  12/14/2016 3:30 PM

## 2016-12-14 NOTE — Telephone Encounter (Signed)
Spoke with Ria Comment at [facility], states that they do not wish to d/c pt from hospice.  States that pt is wanting to remain on hospice care, and per Ria Comment, Dr. Lyman Speller and Ria Comment saw patient in home a few weeks ago and decided that her prognosis was still less than 6 months.    Ria Comment is requesting MR's current recs- Ria Comment and pt would like to remain on hospice care unless her prognosis has changed to longer than 6 months, in which she will be required by insurance protocol to d/c hospice care.  MR please clarify.  Thanks.

## 2016-12-14 NOTE — Telephone Encounter (Signed)
Never heard back from Dr Lyman Speller. Please give [facility] my cell for him to call  Dr. Brand Males, M.D., [facility].C.P Pulmonary and Critical Care Medicine Staff Physician [place] [facility] Pager: [phone], If no answer or between  15:00h - 7:00h: call [phone]  12/14/2016 9:24 AM

## 2016-12-14 NOTE — Telephone Encounter (Signed)
MR please advise. Thanks! 

## 2016-12-14 NOTE — Telephone Encounter (Signed)
Pt returning Margie's call from today.-tr °

## 2016-12-14 NOTE — Telephone Encounter (Addendum)
lmtcb x1 for pt Called and spoke with Ria Comment with Hospice, who request for clarification on Hopsice. Ria Comment states it was her understanding that during pt's last OV with MR, he wished for pt to continue hospice for another 31mo. Ria Comment states today MR spoke with Dr. Lyman Speller today about discharging pt from hospice.  MR please advise. Thanks.

## 2016-12-14 NOTE — Telephone Encounter (Signed)
Prognosis hard to predict but if this disease ran its normal course she could be dead in 6 months, If she feels she is benefiting from hospice then she can remain and we can reassess  Dr. Brand Males, M.D., [facility].C.P Pulmonary and Critical Care Medicine Staff Physician [place] [facility] Pager: [phone], If no answer or between  15:00h - 7:00h: call [phone]  12/14/2016 6:21 PM

## 2016-12-14 NOTE — Telephone Encounter (Signed)
Will close this message since there is already another message on this patient.

## 2016-12-14 NOTE — Telephone Encounter (Signed)
Ria Comment with [facility], [phone], is calling.  States patient was seen by MR a couple of weeks ago and patient was re-certified for another 3 months, which is what patient wanted.  States MR called this am and stated to discharge the patient from Hospice because she is no longer eligible.  States getting mixed messages and wants to speak with nurse.

## 2016-12-15 NOTE — Telephone Encounter (Signed)
Spoke with Ria Comment at [facility], aware of recs.   lmtcb for pt to make aware of recs.

## 2016-12-16 DIAGNOSIS — R634 Abnormal weight loss: Secondary | ICD-10-CM | POA: Diagnosis not present

## 2016-12-16 DIAGNOSIS — I10 Essential (primary) hypertension: Secondary | ICD-10-CM | POA: Diagnosis not present

## 2016-12-16 DIAGNOSIS — R630 Anorexia: Secondary | ICD-10-CM | POA: Diagnosis not present

## 2016-12-16 DIAGNOSIS — J9620 Acute and chronic respiratory failure, unspecified whether with hypoxia or hypercapnia: Secondary | ICD-10-CM | POA: Diagnosis not present

## 2016-12-16 DIAGNOSIS — J209 Acute bronchitis, unspecified: Secondary | ICD-10-CM | POA: Diagnosis not present

## 2016-12-16 DIAGNOSIS — J449 Chronic obstructive pulmonary disease, unspecified: Secondary | ICD-10-CM | POA: Diagnosis not present

## 2016-12-16 NOTE — Telephone Encounter (Signed)
Patient is returning phone call.  °

## 2016-12-16 NOTE — Telephone Encounter (Signed)
Spoke with pt, advised she can continue Hospice. Pt understood and nothing further is needed/

## 2016-12-19 DIAGNOSIS — R630 Anorexia: Secondary | ICD-10-CM | POA: Diagnosis not present

## 2016-12-19 DIAGNOSIS — J449 Chronic obstructive pulmonary disease, unspecified: Secondary | ICD-10-CM | POA: Diagnosis not present

## 2016-12-19 DIAGNOSIS — R634 Abnormal weight loss: Secondary | ICD-10-CM | POA: Diagnosis not present

## 2016-12-19 DIAGNOSIS — J209 Acute bronchitis, unspecified: Secondary | ICD-10-CM | POA: Diagnosis not present

## 2016-12-19 DIAGNOSIS — I10 Essential (primary) hypertension: Secondary | ICD-10-CM | POA: Diagnosis not present

## 2016-12-19 DIAGNOSIS — J9620 Acute and chronic respiratory failure, unspecified whether with hypoxia or hypercapnia: Secondary | ICD-10-CM | POA: Diagnosis not present

## 2016-12-20 NOTE — Telephone Encounter (Signed)
Will need to call after 9 for Dr. Jacelyn Grip forward to triage

## 2016-12-20 NOTE — Telephone Encounter (Signed)
Left detailed message for Dr. Karie Georges to call MR's cell number.

## 2016-12-21 ENCOUNTER — Telehealth: Payer: Self-pay | Admitting: Internal Medicine

## 2016-12-21 DIAGNOSIS — R634 Abnormal weight loss: Secondary | ICD-10-CM | POA: Diagnosis not present

## 2016-12-21 DIAGNOSIS — J449 Chronic obstructive pulmonary disease, unspecified: Secondary | ICD-10-CM | POA: Diagnosis not present

## 2016-12-21 DIAGNOSIS — J9620 Acute and chronic respiratory failure, unspecified whether with hypoxia or hypercapnia: Secondary | ICD-10-CM | POA: Diagnosis not present

## 2016-12-21 DIAGNOSIS — J209 Acute bronchitis, unspecified: Secondary | ICD-10-CM | POA: Diagnosis not present

## 2016-12-21 DIAGNOSIS — R630 Anorexia: Secondary | ICD-10-CM | POA: Diagnosis not present

## 2016-12-21 DIAGNOSIS — I10 Essential (primary) hypertension: Secondary | ICD-10-CM | POA: Diagnosis not present

## 2016-12-21 NOTE — Telephone Encounter (Signed)
Spoke with pt, asking about a financial assistance program regarding Trelegy.  I explained the manufacturer assistance program, pt states that she does not wish to fill out an application at this time.  I advised pt to call our office back if she changes her mind.  Pt expressed understanding.  Nothing further needed.

## 2016-12-23 DIAGNOSIS — J9620 Acute and chronic respiratory failure, unspecified whether with hypoxia or hypercapnia: Secondary | ICD-10-CM | POA: Diagnosis not present

## 2016-12-23 DIAGNOSIS — J449 Chronic obstructive pulmonary disease, unspecified: Secondary | ICD-10-CM | POA: Diagnosis not present

## 2016-12-23 DIAGNOSIS — R630 Anorexia: Secondary | ICD-10-CM | POA: Diagnosis not present

## 2016-12-23 DIAGNOSIS — I10 Essential (primary) hypertension: Secondary | ICD-10-CM | POA: Diagnosis not present

## 2016-12-23 DIAGNOSIS — R634 Abnormal weight loss: Secondary | ICD-10-CM | POA: Diagnosis not present

## 2016-12-23 DIAGNOSIS — J209 Acute bronchitis, unspecified: Secondary | ICD-10-CM | POA: Diagnosis not present

## 2016-12-24 DIAGNOSIS — J9620 Acute and chronic respiratory failure, unspecified whether with hypoxia or hypercapnia: Secondary | ICD-10-CM | POA: Diagnosis not present

## 2016-12-24 DIAGNOSIS — R634 Abnormal weight loss: Secondary | ICD-10-CM | POA: Diagnosis not present

## 2016-12-24 DIAGNOSIS — J449 Chronic obstructive pulmonary disease, unspecified: Secondary | ICD-10-CM | POA: Diagnosis not present

## 2016-12-24 DIAGNOSIS — J209 Acute bronchitis, unspecified: Secondary | ICD-10-CM | POA: Diagnosis not present

## 2016-12-24 DIAGNOSIS — R630 Anorexia: Secondary | ICD-10-CM | POA: Diagnosis not present

## 2016-12-24 DIAGNOSIS — I10 Essential (primary) hypertension: Secondary | ICD-10-CM | POA: Diagnosis not present

## 2016-12-28 DIAGNOSIS — R630 Anorexia: Secondary | ICD-10-CM | POA: Diagnosis not present

## 2016-12-28 DIAGNOSIS — J9620 Acute and chronic respiratory failure, unspecified whether with hypoxia or hypercapnia: Secondary | ICD-10-CM | POA: Diagnosis not present

## 2016-12-28 DIAGNOSIS — J209 Acute bronchitis, unspecified: Secondary | ICD-10-CM | POA: Diagnosis not present

## 2016-12-28 DIAGNOSIS — I10 Essential (primary) hypertension: Secondary | ICD-10-CM | POA: Diagnosis not present

## 2016-12-28 DIAGNOSIS — R634 Abnormal weight loss: Secondary | ICD-10-CM | POA: Diagnosis not present

## 2016-12-28 DIAGNOSIS — J449 Chronic obstructive pulmonary disease, unspecified: Secondary | ICD-10-CM | POA: Diagnosis not present

## 2016-12-30 DIAGNOSIS — J209 Acute bronchitis, unspecified: Secondary | ICD-10-CM | POA: Diagnosis not present

## 2016-12-30 DIAGNOSIS — J449 Chronic obstructive pulmonary disease, unspecified: Secondary | ICD-10-CM | POA: Diagnosis not present

## 2016-12-30 DIAGNOSIS — R634 Abnormal weight loss: Secondary | ICD-10-CM | POA: Diagnosis not present

## 2016-12-30 DIAGNOSIS — J9620 Acute and chronic respiratory failure, unspecified whether with hypoxia or hypercapnia: Secondary | ICD-10-CM | POA: Diagnosis not present

## 2016-12-30 DIAGNOSIS — I10 Essential (primary) hypertension: Secondary | ICD-10-CM | POA: Diagnosis not present

## 2016-12-30 DIAGNOSIS — R630 Anorexia: Secondary | ICD-10-CM | POA: Diagnosis not present

## 2017-01-04 DIAGNOSIS — I10 Essential (primary) hypertension: Secondary | ICD-10-CM | POA: Diagnosis not present

## 2017-01-04 DIAGNOSIS — J9620 Acute and chronic respiratory failure, unspecified whether with hypoxia or hypercapnia: Secondary | ICD-10-CM | POA: Diagnosis not present

## 2017-01-04 DIAGNOSIS — J449 Chronic obstructive pulmonary disease, unspecified: Secondary | ICD-10-CM | POA: Diagnosis not present

## 2017-01-04 DIAGNOSIS — R634 Abnormal weight loss: Secondary | ICD-10-CM | POA: Diagnosis not present

## 2017-01-04 DIAGNOSIS — R630 Anorexia: Secondary | ICD-10-CM | POA: Diagnosis not present

## 2017-01-04 DIAGNOSIS — J209 Acute bronchitis, unspecified: Secondary | ICD-10-CM | POA: Diagnosis not present

## 2017-01-06 DIAGNOSIS — R630 Anorexia: Secondary | ICD-10-CM | POA: Diagnosis not present

## 2017-01-06 DIAGNOSIS — J449 Chronic obstructive pulmonary disease, unspecified: Secondary | ICD-10-CM | POA: Diagnosis not present

## 2017-01-06 DIAGNOSIS — I10 Essential (primary) hypertension: Secondary | ICD-10-CM | POA: Diagnosis not present

## 2017-01-06 DIAGNOSIS — R634 Abnormal weight loss: Secondary | ICD-10-CM | POA: Diagnosis not present

## 2017-01-06 DIAGNOSIS — J209 Acute bronchitis, unspecified: Secondary | ICD-10-CM | POA: Diagnosis not present

## 2017-01-06 DIAGNOSIS — J9620 Acute and chronic respiratory failure, unspecified whether with hypoxia or hypercapnia: Secondary | ICD-10-CM | POA: Diagnosis not present

## 2017-01-13 DIAGNOSIS — I10 Essential (primary) hypertension: Secondary | ICD-10-CM | POA: Diagnosis not present

## 2017-01-13 DIAGNOSIS — J209 Acute bronchitis, unspecified: Secondary | ICD-10-CM | POA: Diagnosis not present

## 2017-01-13 DIAGNOSIS — R634 Abnormal weight loss: Secondary | ICD-10-CM | POA: Diagnosis not present

## 2017-01-13 DIAGNOSIS — R630 Anorexia: Secondary | ICD-10-CM | POA: Diagnosis not present

## 2017-01-13 DIAGNOSIS — J9620 Acute and chronic respiratory failure, unspecified whether with hypoxia or hypercapnia: Secondary | ICD-10-CM | POA: Diagnosis not present

## 2017-01-13 DIAGNOSIS — J449 Chronic obstructive pulmonary disease, unspecified: Secondary | ICD-10-CM | POA: Diagnosis not present

## 2017-01-18 DIAGNOSIS — R630 Anorexia: Secondary | ICD-10-CM | POA: Diagnosis not present

## 2017-01-18 DIAGNOSIS — J449 Chronic obstructive pulmonary disease, unspecified: Secondary | ICD-10-CM | POA: Diagnosis not present

## 2017-01-18 DIAGNOSIS — R634 Abnormal weight loss: Secondary | ICD-10-CM | POA: Diagnosis not present

## 2017-01-18 DIAGNOSIS — J9620 Acute and chronic respiratory failure, unspecified whether with hypoxia or hypercapnia: Secondary | ICD-10-CM | POA: Diagnosis not present

## 2017-01-18 DIAGNOSIS — J209 Acute bronchitis, unspecified: Secondary | ICD-10-CM | POA: Diagnosis not present

## 2017-01-18 DIAGNOSIS — I10 Essential (primary) hypertension: Secondary | ICD-10-CM | POA: Diagnosis not present

## 2017-01-19 DIAGNOSIS — R634 Abnormal weight loss: Secondary | ICD-10-CM | POA: Diagnosis not present

## 2017-01-19 DIAGNOSIS — J209 Acute bronchitis, unspecified: Secondary | ICD-10-CM | POA: Diagnosis not present

## 2017-01-19 DIAGNOSIS — J449 Chronic obstructive pulmonary disease, unspecified: Secondary | ICD-10-CM | POA: Diagnosis not present

## 2017-01-19 DIAGNOSIS — R630 Anorexia: Secondary | ICD-10-CM | POA: Diagnosis not present

## 2017-01-19 DIAGNOSIS — J9620 Acute and chronic respiratory failure, unspecified whether with hypoxia or hypercapnia: Secondary | ICD-10-CM | POA: Diagnosis not present

## 2017-01-19 DIAGNOSIS — I10 Essential (primary) hypertension: Secondary | ICD-10-CM | POA: Diagnosis not present

## 2017-01-20 DIAGNOSIS — R630 Anorexia: Secondary | ICD-10-CM | POA: Diagnosis not present

## 2017-01-20 DIAGNOSIS — I10 Essential (primary) hypertension: Secondary | ICD-10-CM | POA: Diagnosis not present

## 2017-01-20 DIAGNOSIS — J209 Acute bronchitis, unspecified: Secondary | ICD-10-CM | POA: Diagnosis not present

## 2017-01-20 DIAGNOSIS — J9620 Acute and chronic respiratory failure, unspecified whether with hypoxia or hypercapnia: Secondary | ICD-10-CM | POA: Diagnosis not present

## 2017-01-20 DIAGNOSIS — R634 Abnormal weight loss: Secondary | ICD-10-CM | POA: Diagnosis not present

## 2017-01-20 DIAGNOSIS — J449 Chronic obstructive pulmonary disease, unspecified: Secondary | ICD-10-CM | POA: Diagnosis not present

## 2017-01-21 DIAGNOSIS — R634 Abnormal weight loss: Secondary | ICD-10-CM | POA: Diagnosis not present

## 2017-01-21 DIAGNOSIS — I10 Essential (primary) hypertension: Secondary | ICD-10-CM | POA: Diagnosis not present

## 2017-01-21 DIAGNOSIS — J9620 Acute and chronic respiratory failure, unspecified whether with hypoxia or hypercapnia: Secondary | ICD-10-CM | POA: Diagnosis not present

## 2017-01-21 DIAGNOSIS — R630 Anorexia: Secondary | ICD-10-CM | POA: Diagnosis not present

## 2017-01-21 DIAGNOSIS — J449 Chronic obstructive pulmonary disease, unspecified: Secondary | ICD-10-CM | POA: Diagnosis not present

## 2017-01-21 DIAGNOSIS — J209 Acute bronchitis, unspecified: Secondary | ICD-10-CM | POA: Diagnosis not present

## 2017-01-25 DIAGNOSIS — J449 Chronic obstructive pulmonary disease, unspecified: Secondary | ICD-10-CM | POA: Diagnosis not present

## 2017-01-25 DIAGNOSIS — R630 Anorexia: Secondary | ICD-10-CM | POA: Diagnosis not present

## 2017-01-25 DIAGNOSIS — J209 Acute bronchitis, unspecified: Secondary | ICD-10-CM | POA: Diagnosis not present

## 2017-01-25 DIAGNOSIS — R634 Abnormal weight loss: Secondary | ICD-10-CM | POA: Diagnosis not present

## 2017-01-25 DIAGNOSIS — J9620 Acute and chronic respiratory failure, unspecified whether with hypoxia or hypercapnia: Secondary | ICD-10-CM | POA: Diagnosis not present

## 2017-01-25 DIAGNOSIS — I10 Essential (primary) hypertension: Secondary | ICD-10-CM | POA: Diagnosis not present

## 2017-01-27 DIAGNOSIS — J9620 Acute and chronic respiratory failure, unspecified whether with hypoxia or hypercapnia: Secondary | ICD-10-CM | POA: Diagnosis not present

## 2017-01-27 DIAGNOSIS — R630 Anorexia: Secondary | ICD-10-CM | POA: Diagnosis not present

## 2017-01-27 DIAGNOSIS — R634 Abnormal weight loss: Secondary | ICD-10-CM | POA: Diagnosis not present

## 2017-01-27 DIAGNOSIS — I10 Essential (primary) hypertension: Secondary | ICD-10-CM | POA: Diagnosis not present

## 2017-01-27 DIAGNOSIS — J209 Acute bronchitis, unspecified: Secondary | ICD-10-CM | POA: Diagnosis not present

## 2017-01-27 DIAGNOSIS — J449 Chronic obstructive pulmonary disease, unspecified: Secondary | ICD-10-CM | POA: Diagnosis not present

## 2017-02-03 DIAGNOSIS — J449 Chronic obstructive pulmonary disease, unspecified: Secondary | ICD-10-CM | POA: Diagnosis not present

## 2017-02-03 DIAGNOSIS — J9620 Acute and chronic respiratory failure, unspecified whether with hypoxia or hypercapnia: Secondary | ICD-10-CM | POA: Diagnosis not present

## 2017-02-03 DIAGNOSIS — J209 Acute bronchitis, unspecified: Secondary | ICD-10-CM | POA: Diagnosis not present

## 2017-02-03 DIAGNOSIS — R634 Abnormal weight loss: Secondary | ICD-10-CM | POA: Diagnosis not present

## 2017-02-03 DIAGNOSIS — R630 Anorexia: Secondary | ICD-10-CM | POA: Diagnosis not present

## 2017-02-03 DIAGNOSIS — I10 Essential (primary) hypertension: Secondary | ICD-10-CM | POA: Diagnosis not present

## 2017-02-10 DIAGNOSIS — I10 Essential (primary) hypertension: Secondary | ICD-10-CM | POA: Diagnosis not present

## 2017-02-10 DIAGNOSIS — J209 Acute bronchitis, unspecified: Secondary | ICD-10-CM | POA: Diagnosis not present

## 2017-02-10 DIAGNOSIS — J449 Chronic obstructive pulmonary disease, unspecified: Secondary | ICD-10-CM | POA: Diagnosis not present

## 2017-02-10 DIAGNOSIS — R634 Abnormal weight loss: Secondary | ICD-10-CM | POA: Diagnosis not present

## 2017-02-10 DIAGNOSIS — J9620 Acute and chronic respiratory failure, unspecified whether with hypoxia or hypercapnia: Secondary | ICD-10-CM | POA: Diagnosis not present

## 2017-02-10 DIAGNOSIS — R630 Anorexia: Secondary | ICD-10-CM | POA: Diagnosis not present

## 2017-02-12 ENCOUNTER — Other Ambulatory Visit: Payer: Self-pay | Admitting: Internal Medicine

## 2017-02-17 DIAGNOSIS — R634 Abnormal weight loss: Secondary | ICD-10-CM | POA: Diagnosis not present

## 2017-02-17 DIAGNOSIS — I10 Essential (primary) hypertension: Secondary | ICD-10-CM | POA: Diagnosis not present

## 2017-02-17 DIAGNOSIS — J449 Chronic obstructive pulmonary disease, unspecified: Secondary | ICD-10-CM | POA: Diagnosis not present

## 2017-02-17 DIAGNOSIS — J9620 Acute and chronic respiratory failure, unspecified whether with hypoxia or hypercapnia: Secondary | ICD-10-CM | POA: Diagnosis not present

## 2017-02-17 DIAGNOSIS — R630 Anorexia: Secondary | ICD-10-CM | POA: Diagnosis not present

## 2017-02-17 DIAGNOSIS — J209 Acute bronchitis, unspecified: Secondary | ICD-10-CM | POA: Diagnosis not present

## 2017-02-18 DIAGNOSIS — J9620 Acute and chronic respiratory failure, unspecified whether with hypoxia or hypercapnia: Secondary | ICD-10-CM | POA: Diagnosis not present

## 2017-02-18 DIAGNOSIS — R630 Anorexia: Secondary | ICD-10-CM | POA: Diagnosis not present

## 2017-02-18 DIAGNOSIS — I10 Essential (primary) hypertension: Secondary | ICD-10-CM | POA: Diagnosis not present

## 2017-02-18 DIAGNOSIS — J209 Acute bronchitis, unspecified: Secondary | ICD-10-CM | POA: Diagnosis not present

## 2017-02-18 DIAGNOSIS — R634 Abnormal weight loss: Secondary | ICD-10-CM | POA: Diagnosis not present

## 2017-02-18 DIAGNOSIS — J449 Chronic obstructive pulmonary disease, unspecified: Secondary | ICD-10-CM | POA: Diagnosis not present

## 2017-02-21 ENCOUNTER — Encounter: Payer: Self-pay | Admitting: Internal Medicine

## 2017-02-21 ENCOUNTER — Ambulatory Visit (INDEPENDENT_AMBULATORY_CARE_PROVIDER_SITE_OTHER): Payer: Medicare Other | Admitting: Internal Medicine

## 2017-02-21 VITALS — BP 124/62 | HR 91 | Ht 61.0 in | Wt 124.0 lb

## 2017-02-21 DIAGNOSIS — J449 Chronic obstructive pulmonary disease, unspecified: Secondary | ICD-10-CM

## 2017-02-21 DIAGNOSIS — Z515 Encounter for palliative care: Secondary | ICD-10-CM

## 2017-02-21 MED ORDER — MOMETASONE FUROATE 200 MCG/ACT IN AERO: 2.0000 | INHALATION_SPRAY | Freq: Two times a day (BID) | RESPIRATORY_TRACT | 0 refills | Status: DC

## 2017-02-21 MED ORDER — TIOTROPIUM BROMIDE-OLODATEROL 2.5-2.5 MCG/ACT IN AERS: 2.0000 | INHALATION_SPRAY | Freq: Every day | RESPIRATORY_TRACT | 0 refills | Status: DC

## 2017-02-21 NOTE — Progress Notes (Signed)
Patient seen in the office today and instructed on use of asmanex ad stiolto.  Patient expressed understanding and demonstrated technique. Salome Arnt, LPN

## 2017-02-21 NOTE — Progress Notes (Signed)
Subjective:     Patient ID: Sierra Warner, female   DOB: 09-05-1928, 83 y.o.   MRN: [id number]  HPI   OV 08/23/2016  Chief Complaint  Patient presents with  . Follow-up    Pt states her breathing is unchanged since last OV. Pt c/o cough with little mucus production. Pt denies CP/tightness and f/c/s.   83 year old female Gold stage IV COPD. She is now under the care of hospice which is paradoxically of the functional status. She is driving. She is on oxygen. She is on nebulizers. Her new complaint is that she's got bilateral pedal edema worse in the daytime as the day progresses. Better with night and at rest. She is unaware she has bilateral varicose veins. Initially she was reluctant to take hospice a few months ago but now she says she's loving it because of the extra layer of supportive provided. She is now worried that she will live past 6 months expectancy and she will "fired" from the hospice. Nevertheless have told her that with her advanced COPD the prognosis is less than 6 months   OV 11/23/2016  Chief Complaint  Patient presents with  . Follow-up    Pt states her breathing is unchanged since last OV. Pt c/o mild prod cough with little mucus production - white in color. Pt denies CP/tightness and f/c/s.    Advanced COPD on home hospice  She feels hospice is working really well for her and has improved her functional status. She is worried that her hospice services might be terminated because of her good functional status. She is on nebulizers. She prefers inhalers and is asking for samples. She says the only thing frustrating about hospice is that just be on nebulizers. Nevertheless overall program has helped her. She will have flu shot today. Walk walking desaturation test on an 185 feet 3 laps on room air: She only did 2 laps and stopped because of dyspnea but more so leg pain. She started off at a pulse rate of 91/m final heart rate was similar. Resting pulse ox was 91%. Final pulse ox  was 88% at end of second lap   OV 02/21/2017  Chief Complaint  Patient presents with  . Follow-up    Pt states that she has her good days and bad days. Has complaints of SOB with exertion, occ. cough that can range with mucus of white to yellow mucus. Denies any CP.    Stage IV COPD patient with exertional hypoxemia on hospice care  Overall COPD stable.  She has not had any exacerbation.  She has multiple systemic complaints all documented below.  Particularly she is having some friction rub in her cheek and her left neck from the oxygen tubing.  She plans to see dermatology for this.  She applies expensive cosmetic fashion oriented face creams.  Such as Alvino Blood and Shelly Flatten.  However she does not apply any petroleum jelly to the area of friction rub.  Smoking continues to be under remission.  She is up-to-date with her flu shot.  She enjoys the hospice services and this has helped to improve her functional status.  She is able to drive.  However she does not want to give up hospice services.  Explained the concept of hospice and recertification.  She seems more amenable to lose hospice services if they deem so.  She will be open to palliative support.  But for now she wants to continue hospice service.  She is also complaining  that the trilogy  inhalers no expensive because of donut hole and she wants samples.  CAT COPD Symptom & Quality of Life Score (GSK trademark) 0 is no burden. 5 is highest burden 02/21/2017   Never Cough -> Cough all the time 3  No phlegm in chest -> Chest is full of phlegm 3  No chest tightness -> Chest feels very tight 2  No dyspnea for 1 flight stairs/hill -> Very dyspneic for 1 flight of stairs 5  No limitations for ADL at home -> Very limited with ADL at home 3  Confident leaving home -> Not at all confident leaving home 1  Sleep soundly -> Do not sleep soundly because of lung condition 3  Lots of Energy -> No energy at all 4  TOTAL Score (max 40)  24      [place] Symptom Assessment Numerical Scale 0 is no problem -> 10 worst problem 02/21/2017    No Pain -> Worst pain 5  No Tiredness -> Worset tiredness 4  No Nausea -> Worst nausea 3  No Depression -> Worst depression 7  No Anxiety -> Worst Anxiety 7  No Drowsiness -> Worst Drowsiness 5  Best appetite-> Worst Appetitle 7  Best Feeling of well being -> Worst feeling 6  No dyspnea-> Worst dyspnea 8  Other problem (none -> severe) Friction rub from o2 tubing face , cheek and neck  Completed by             has a past medical history of Atrophic vaginitis, COPD (chronic obstructive pulmonary disease) (HCC), COPD, mild ([place]), Hemorrhoids, Hypertension, Microscopic hematuria, and Osteopenia.   reports that she quit smoking about 9 years ago. Her smoking use included cigarettes. She has a 13.50 pack-year smoking history. she has never used smokeless tobacco.  Past Surgical History:  Procedure Laterality Date  . APPENDECTOMY  1933  . CATARACT SURGERY    . CYSTOSCOPY    . THYROIDECTOMY, PARTIAL    . TONSILLECTOMY      Allergies  Allergen Reactions  . Sulfa Antibiotics Hives and Rash    Immunization History  Administered Date(s) Administered  . Influenza Split 12/14/2011, 12/18/2012  . Influenza Whole 12/20/2010  . Influenza, High Dose Seasonal PF 12/23/2015, 11/23/2016  . Influenza,inj,Quad PF,6+ Mos 11/26/2013, 12/20/2014  . Pneumococcal Conjugate-13 02/26/2013  . Pneumococcal Polysaccharide-23 10/13/2014    Family History  Problem Relation Age of Onset  . Diabetes Mother   . Hypertension Mother   . Heart disease Father   . Hypertension Father   . Heart failure Father      Current Outpatient Medications:  .  ALPRAZolam (XANAX) 0.25 MG tablet, Take 1 tablet (0.25 mg total) by mouth 3 (three) times daily as needed for anxiety., Disp: 12 tablet, Rfl: 0 .  Calcium Carbonate-Vitamin D (CALCIUM + D PO), Take by mouth 2 (two) times daily.  , Disp: , Rfl:  .   cholecalciferol (VITAMIN D) 1000 UNITS tablet, Take 1,000 Units by mouth daily., Disp: , Rfl:  .  COMBIVENT RESPIMAT 20-100 MCG/ACT AERS respimat, Inhale 1 puff into the lungs every 6 (six) hours as needed for wheezing or shortness of breath. , Disp: , Rfl:  .  furosemide (LASIX) 20 MG tablet, TAKE HALF A TABLET BY MOUTH DAILY, Disp: 15 tablet, Rfl: 0 .  ipratropium-albuterol (DUONEB) 0.5-2.5 (3) MG/3ML SOLN, Take 3 mLs by nebulization 4 (four) times daily. And as needed, Disp: 360 mL, Rfl: 11 .  losartan (COZAAR) 100 MG tablet,  Take 1 tablet (100 mg total) by mouth daily., Disp: 30 tablet, Rfl: 3 .  Multiple Vitamins-Minerals (MULTIVITAMIN GUMMIES ADULT PO), Take 1 tablet by mouth daily. , Disp: , Rfl:  .  Polyethylene Glycol 3350 (MIRALAX PO), Take 1 packet by mouth daily. , Disp: , Rfl:    Review of Systems     Objective:   Physical Exam  Constitutional: She is oriented to person, place, and time. She appears well-developed and well-nourished. No distress.  HENT:  Head: Normocephalic and atraumatic.  Right Ear: External ear normal.  Left Ear: External ear normal.  Mouth/Throat: Oropharynx is clear and moist. No oropharyngeal exudate.  Eyes: Conjunctivae and EOM are normal. Pupils are equal, round, and reactive to light. Right eye exhibits no discharge. Left eye exhibits no discharge. No scleral icterus.  Neck: Normal range of motion. Neck supple. No JVD present. No tracheal deviation present. No thyromegaly present.  Cardiovascular: Normal rate, regular rhythm, normal heart sounds and intact distal pulses. Exam reveals no gallop and no friction rub.  No murmur heard. Pulmonary/Chest: Effort normal and breath sounds normal. No respiratory distress. She has no wheezes. She has no rales. She exhibits no tenderness.  barrell chest Purse lip mild  Abdominal: Soft. Bowel sounds are normal. She exhibits no distension and no mass. There is no tenderness. There is no rebound and no guarding.   Musculoskeletal: Normal range of motion. She exhibits no edema or tenderness.  Lymphadenopathy:    She has no cervical adenopathy.  Neurological: She is alert and oriented to person, place, and time. She has normal reflexes. No cranial nerve deficit. She exhibits normal muscle tone. Coordination normal.  Skin: Skin is warm and dry. No rash noted. She is not diaphoretic. No erythema. No pallor.  Psychiatric: She has a normal mood and affect. Her behavior is normal. Judgment and thought content normal.  Vitals reviewed.  Vitals:   02/21/17 1205  BP: 124/62  Pulse: 91  SpO2: 95%  Weight: 124 lb (56.2 kg)  Height: 5\' 1"  (1.549 m)       Assessment:       ICD-10-CM   1. Stage 4 very severe COPD by GOLD classification (HCC) J44.9   2. Hospice care patient Z51.5        Plan:       Stable copd  Plan - continue hospice care as long as they think you will qualify; disease could run its natural course and you could be dead in 6 months - continue nebs combivent as needed - continue trelegy to take daily  - till you get out of donut hole take asmanex 2 puff once daily and stiolto daily - - you do not need o2 at rest in day time but need with exertion. Continue o2 at night  - ok to see derm for friction rub, apply vaseline there   followup 3 months with APP or DR [name] or sooner if needed    Dr. Brand Males, M.D., [facility].C.P Pulmonary and Critical Care Medicine Staff Physician, [place] Director - Interstitial Lung Disease  Program  Pulmonary [place] at [place], [place], [address]  Pager: [phone], If no answer or between  15:00h - 7:00h: call [phone] Telephone: [phone]     Dr. Brand Males, M.D., [facility].C.P Pulmonary and Critical Care Medicine Staff Physician, [place] Director - Interstitial Lung Disease  Program  [facility] -  [place] at [place], [place], [address]  Pager: [phone], If no answer or between  15:00h - 7:00h: call [phone] Telephone: [phone]

## 2017-02-21 NOTE — Patient Instructions (Addendum)
    ICD-10-CM   1. Stage 4 very severe COPD by GOLD classification ([place]) J44.9   2. Hospice care patient Z51.5      Stable copd  Plan - continue hospice care as long as they think you will qualify; disease could run its natural course and you could be dead in 6 months - continue nebs combivent as needed - continue trelegy to take daily  - till you get out of donut hole take asmanex 2 puff once daily and stiolto daily - - you do not need o2 at rest in day time but need with exertion. Continue o2 at night  - ok to see derm for friction rub, apply vaseline there   followup 3 months with APP or DR [name] or sooner if needed

## 2017-02-24 DIAGNOSIS — R630 Anorexia: Secondary | ICD-10-CM | POA: Diagnosis not present

## 2017-02-24 DIAGNOSIS — J9620 Acute and chronic respiratory failure, unspecified whether with hypoxia or hypercapnia: Secondary | ICD-10-CM | POA: Diagnosis not present

## 2017-02-24 DIAGNOSIS — J209 Acute bronchitis, unspecified: Secondary | ICD-10-CM | POA: Diagnosis not present

## 2017-02-24 DIAGNOSIS — R634 Abnormal weight loss: Secondary | ICD-10-CM | POA: Diagnosis not present

## 2017-02-24 DIAGNOSIS — I10 Essential (primary) hypertension: Secondary | ICD-10-CM | POA: Diagnosis not present

## 2017-02-24 DIAGNOSIS — J449 Chronic obstructive pulmonary disease, unspecified: Secondary | ICD-10-CM | POA: Diagnosis not present

## 2017-03-01 DIAGNOSIS — J209 Acute bronchitis, unspecified: Secondary | ICD-10-CM | POA: Diagnosis not present

## 2017-03-01 DIAGNOSIS — R630 Anorexia: Secondary | ICD-10-CM | POA: Diagnosis not present

## 2017-03-01 DIAGNOSIS — J9620 Acute and chronic respiratory failure, unspecified whether with hypoxia or hypercapnia: Secondary | ICD-10-CM | POA: Diagnosis not present

## 2017-03-01 DIAGNOSIS — R634 Abnormal weight loss: Secondary | ICD-10-CM | POA: Diagnosis not present

## 2017-03-01 DIAGNOSIS — J449 Chronic obstructive pulmonary disease, unspecified: Secondary | ICD-10-CM | POA: Diagnosis not present

## 2017-03-01 DIAGNOSIS — I10 Essential (primary) hypertension: Secondary | ICD-10-CM | POA: Diagnosis not present

## 2017-03-03 DIAGNOSIS — J9620 Acute and chronic respiratory failure, unspecified whether with hypoxia or hypercapnia: Secondary | ICD-10-CM | POA: Diagnosis not present

## 2017-03-03 DIAGNOSIS — R634 Abnormal weight loss: Secondary | ICD-10-CM | POA: Diagnosis not present

## 2017-03-03 DIAGNOSIS — I10 Essential (primary) hypertension: Secondary | ICD-10-CM | POA: Diagnosis not present

## 2017-03-03 DIAGNOSIS — R630 Anorexia: Secondary | ICD-10-CM | POA: Diagnosis not present

## 2017-03-03 DIAGNOSIS — J209 Acute bronchitis, unspecified: Secondary | ICD-10-CM | POA: Diagnosis not present

## 2017-03-03 DIAGNOSIS — J449 Chronic obstructive pulmonary disease, unspecified: Secondary | ICD-10-CM | POA: Diagnosis not present

## 2017-03-07 ENCOUNTER — Telehealth: Payer: Self-pay | Admitting: Internal Medicine

## 2017-03-07 NOTE — Telephone Encounter (Signed)
Spoke with pt, advised her that I left a sample of Stiloto up front for her to pick up. Nothing further is needed.

## 2017-03-08 DIAGNOSIS — J9620 Acute and chronic respiratory failure, unspecified whether with hypoxia or hypercapnia: Secondary | ICD-10-CM | POA: Diagnosis not present

## 2017-03-08 DIAGNOSIS — R630 Anorexia: Secondary | ICD-10-CM | POA: Diagnosis not present

## 2017-03-08 DIAGNOSIS — R634 Abnormal weight loss: Secondary | ICD-10-CM | POA: Diagnosis not present

## 2017-03-08 DIAGNOSIS — I10 Essential (primary) hypertension: Secondary | ICD-10-CM | POA: Diagnosis not present

## 2017-03-08 DIAGNOSIS — J209 Acute bronchitis, unspecified: Secondary | ICD-10-CM | POA: Diagnosis not present

## 2017-03-08 DIAGNOSIS — J449 Chronic obstructive pulmonary disease, unspecified: Secondary | ICD-10-CM | POA: Diagnosis not present

## 2017-03-10 DIAGNOSIS — J209 Acute bronchitis, unspecified: Secondary | ICD-10-CM | POA: Diagnosis not present

## 2017-03-10 DIAGNOSIS — J449 Chronic obstructive pulmonary disease, unspecified: Secondary | ICD-10-CM | POA: Diagnosis not present

## 2017-03-10 DIAGNOSIS — J9620 Acute and chronic respiratory failure, unspecified whether with hypoxia or hypercapnia: Secondary | ICD-10-CM | POA: Diagnosis not present

## 2017-03-10 DIAGNOSIS — R630 Anorexia: Secondary | ICD-10-CM | POA: Diagnosis not present

## 2017-03-10 DIAGNOSIS — R634 Abnormal weight loss: Secondary | ICD-10-CM | POA: Diagnosis not present

## 2017-03-10 DIAGNOSIS — I10 Essential (primary) hypertension: Secondary | ICD-10-CM | POA: Diagnosis not present

## 2017-03-12 ENCOUNTER — Other Ambulatory Visit: Payer: Self-pay | Admitting: Internal Medicine

## 2017-03-17 DIAGNOSIS — J449 Chronic obstructive pulmonary disease, unspecified: Secondary | ICD-10-CM | POA: Diagnosis not present

## 2017-03-17 DIAGNOSIS — R634 Abnormal weight loss: Secondary | ICD-10-CM | POA: Diagnosis not present

## 2017-03-17 DIAGNOSIS — J9620 Acute and chronic respiratory failure, unspecified whether with hypoxia or hypercapnia: Secondary | ICD-10-CM | POA: Diagnosis not present

## 2017-03-17 DIAGNOSIS — I10 Essential (primary) hypertension: Secondary | ICD-10-CM | POA: Diagnosis not present

## 2017-03-17 DIAGNOSIS — J209 Acute bronchitis, unspecified: Secondary | ICD-10-CM | POA: Diagnosis not present

## 2017-03-17 DIAGNOSIS — R630 Anorexia: Secondary | ICD-10-CM | POA: Diagnosis not present

## 2017-04-03 ENCOUNTER — Telehealth: Payer: Self-pay | Admitting: Internal Medicine

## 2017-04-03 MED ORDER — COMBIVENT RESPIMAT 20-100 MCG/ACT IN AERS: 1.0000 | INHALATION_SPRAY | Freq: Four times a day (QID) | RESPIRATORY_TRACT | 5 refills | Status: DC | PRN

## 2017-04-03 NOTE — Telephone Encounter (Signed)
Spoke with pt. She is needing a refill on Combivent. Rx has been sent in. Nothing further was needed.

## 2017-04-08 ENCOUNTER — Other Ambulatory Visit: Payer: Self-pay | Admitting: Internal Medicine

## 2017-05-01 ENCOUNTER — Telehealth: Payer: Self-pay | Admitting: Internal Medicine

## 2017-05-02 MED ORDER — PREDNISONE 10 MG PO TABS: ORAL_TABLET | ORAL | 0 refills | Status: DC

## 2017-05-02 MED ORDER — DOXYCYCLINE HYCLATE 100 MG PO TABS: 100.0000 mg | ORAL_TABLET | Freq: Two times a day (BID) | ORAL | 0 refills | Status: DC

## 2017-05-02 NOTE — Telephone Encounter (Signed)
MR, hospice nurse states that patient is having increased SOB, runny nose, congestion, productive cough. Denies fever, chest pain, body aches. Please advise on this, thanks!

## 2017-05-02 NOTE — Telephone Encounter (Signed)
Advised Joy from hospice of MR response. Verbalized understanding, nothing further needed.

## 2017-05-02 NOTE — Telephone Encounter (Signed)
Take doxycycline 100mg  po twice daily x 5 days; take after meals and avoid sunlight  Please take prednisone 40 mg x1 day, then 30 mg x1 day, then 20 mg x1 day, then 10 mg x1 day, and then 5 mg x1 day and stop   Dx AECOPD  Dr. Brand Males, M.D., [facility].C.P Pulmonary and Critical Care Medicine Staff Physician, [place] Director - Interstitial Lung Disease  Program  Pulmonary [place] at [place], [place], [address]  Pager: [phone], If no answer or between  15:00h - 7:00h: call [phone] Telephone: [phone]

## 2017-05-06 ENCOUNTER — Other Ambulatory Visit: Payer: Self-pay | Admitting: Internal Medicine

## 2017-05-08 ENCOUNTER — Other Ambulatory Visit (INDEPENDENT_AMBULATORY_CARE_PROVIDER_SITE_OTHER)

## 2017-05-08 ENCOUNTER — Encounter: Payer: Self-pay | Admitting: Internal Medicine

## 2017-05-08 ENCOUNTER — Ambulatory Visit (INDEPENDENT_AMBULATORY_CARE_PROVIDER_SITE_OTHER): Payer: Medicare Other | Admitting: Internal Medicine

## 2017-05-08 VITALS — BP 124/74 | HR 101 | Ht 61.0 in | Wt 125.2 lb

## 2017-05-08 DIAGNOSIS — R5382 Chronic fatigue, unspecified: Secondary | ICD-10-CM | POA: Diagnosis not present

## 2017-05-08 DIAGNOSIS — D729 Disorder of white blood cells, unspecified: Secondary | ICD-10-CM

## 2017-05-08 DIAGNOSIS — R739 Hyperglycemia, unspecified: Secondary | ICD-10-CM | POA: Diagnosis not present

## 2017-05-08 DIAGNOSIS — J449 Chronic obstructive pulmonary disease, unspecified: Secondary | ICD-10-CM

## 2017-05-08 DIAGNOSIS — E559 Vitamin D deficiency, unspecified: Secondary | ICD-10-CM

## 2017-05-08 DIAGNOSIS — Z515 Encounter for palliative care: Secondary | ICD-10-CM | POA: Diagnosis not present

## 2017-05-08 LAB — CBC WITH DIFFERENTIAL/PLATELET
BASOS ABS: 0.1 10*3/uL (ref 0.0–0.1)
Basophils Relative: 0.5 % (ref 0.0–3.0)
EOS ABS: 0.3 10*3/uL (ref 0.0–0.7)
Eosinophils Relative: 2.2 % (ref 0.0–5.0)
HCT: 39.3 % (ref 36.0–46.0)
Hemoglobin: 13.3 g/dL (ref 12.0–15.0)
Lymphocytes Relative: 14.7 % (ref 12.0–46.0)
Lymphs Abs: 1.8 10*3/uL (ref 0.7–4.0)
MCHC: 33.8 g/dL (ref 30.0–36.0)
MCV: 91.8 fl (ref 78.0–100.0)
MONO ABS: 1.4 10*3/uL — AB (ref 0.1–1.0)
Monocytes Relative: 12 % (ref 3.0–12.0)
NEUTROS PCT: 70.6 % (ref 43.0–77.0)
Neutro Abs: 8.5 10*3/uL — ABNORMAL HIGH (ref 1.4–7.7)
Platelets: 392 10*3/uL (ref 150.0–400.0)
RBC: 4.28 Mil/uL (ref 3.87–5.11)
RDW: 13.8 % (ref 11.5–15.5)
WBC: 12 10*3/uL — ABNORMAL HIGH (ref 4.0–10.5)

## 2017-05-08 LAB — VITAMIN D 25 HYDROXY (VIT D DEFICIENCY, FRACTURES): VITD: 75.33 ng/mL (ref 30.00–100.00)

## 2017-05-08 LAB — HEPATIC FUNCTION PANEL
ALT: 14 U/L (ref 0–35)
AST: 14 U/L (ref 0–37)
Albumin: 3.3 g/dL — ABNORMAL LOW (ref 3.5–5.2)
Alkaline Phosphatase: 56 U/L (ref 39–117)
BILIRUBIN DIRECT: 0.1 mg/dL (ref 0.0–0.3)
BILIRUBIN TOTAL: 0.3 mg/dL (ref 0.2–1.2)
TOTAL PROTEIN: 6.1 g/dL (ref 6.0–8.3)

## 2017-05-08 LAB — BASIC METABOLIC PANEL
BUN: 12 mg/dL (ref 6–23)
CALCIUM: 9 mg/dL (ref 8.4–10.5)
CO2: 33 mEq/L — ABNORMAL HIGH (ref 19–32)
CREATININE: 0.79 mg/dL (ref 0.40–1.20)
Chloride: 92 mEq/L — ABNORMAL LOW (ref 96–112)
GFR: 73.22 mL/min (ref >60.00)
Glucose, Bld: 112 mg/dL — ABNORMAL HIGH (ref 70–99)
Potassium: 5 mEq/L (ref 3.5–5.1)
Sodium: 131 mEq/L — ABNORMAL LOW (ref 135–145)

## 2017-05-08 LAB — HEMOGLOBIN A1C: Hgb A1c MFr Bld: 5.9 % (ref 4.6–6.5)

## 2017-05-08 LAB — PHOSPHORUS: PHOSPHORUS: 3.4 mg/dL (ref 2.3–4.6)

## 2017-05-08 LAB — TSH: TSH: 1.01 u[IU]/mL (ref 0.35–4.50)

## 2017-05-08 LAB — MAGNESIUM: Magnesium: 2 mg/dL (ref 1.5–2.5)

## 2017-05-08 NOTE — Patient Instructions (Signed)
Stage 4 very severe COPD by GOLD classification (Sierra Warner) Hospice care patient   - stable copd and cat score  - cotninue hospice care and regular medicines  Chronic fatigue 0- check cbc, bmet, lft, mag, phos, TSH, Vit D, HgbA1c - will call with results  Followup - 6 months or sooner if needed

## 2017-05-08 NOTE — Progress Notes (Signed)
Subjective:     Patient ID: Sierra Warner, female   DOB: Dec 10, 1928, 84 y.o.   MRN: [id number]  HPI    OV 08/23/2016  Chief Complaint  Patient presents with  . Follow-up    Pt states her breathing is unchanged since last OV. Pt c/o cough with little mucus production. Pt denies CP/tightness and f/c/s.   84 year old female Gold stage IV COPD. She is now under the care of hospice which is paradoxically of the functional status. She is driving. She is on oxygen. She is on nebulizers. Her new complaint is that she's got bilateral pedal edema worse in the daytime as the day progresses. Better with night and at rest. She is unaware she has bilateral varicose veins. Initially she was reluctant to take hospice a few months ago but now she says she's loving it because of the extra layer of supportive provided. She is now worried that she will live past 6 months expectancy and she will "fired" from the hospice. Nevertheless have told her that with her advanced COPD the prognosis is less than 6 months   OV 11/23/2016  Chief Complaint  Patient presents with  . Follow-up    Pt states her breathing is unchanged since last OV. Pt c/o mild prod cough with little mucus production - white in color. Pt denies CP/tightness and f/c/s.    Advanced COPD on home hospice  She feels hospice is working really well for her and has improved her functional status. She is worried that her hospice services might be terminated because of her good functional status. She is on nebulizers. She prefers inhalers and is asking for samples. She says the only thing frustrating about hospice is that just be on nebulizers. Nevertheless overall program has helped her. She will have flu shot today. Walk walking desaturation test on an 185 feet 3 laps on room air: She only did 2 laps and stopped because of dyspnea but more so leg pain. She started off at a pulse rate of 91/m final heart rate was similar. Resting pulse ox was 91%. Final pulse  ox was 88% at end of second lap   OV 02/21/2017  Chief Complaint  Patient presents with  . Follow-up    Pt states that she has her good days and bad days. Has complaints of SOB with exertion, occ. cough that can range with mucus of white to yellow mucus. Denies any CP.    Stage IV COPD patient with exertional hypoxemia on hospice care  Overall COPD stable.  She has not had any exacerbation.  She has multiple systemic complaints all documented below.  Particularly she is having some friction rub in her cheek and her left neck from the oxygen tubing.  She plans to see dermatology for this.  She applies expensive cosmetic fashion oriented face creams.  Such as Alvino Blood and Shelly Flatten.  However she does not apply any petroleum jelly to the area of friction rub.  Smoking continues to be under remission.  She is up-to-date with her flu shot.  She enjoys the hospice services and this has helped to improve her functional status.  She is able to drive.  However she does not want to give up hospice services.  Explained the concept of hospice and recertification.  She seems more amenable to lose hospice services if they deem so.  She will be open to palliative support.  But for now she wants to continue hospice service.  She is also  complaining that the trilogy  inhalers   expensive because of donut hole and she wants samples.   OV 05/08/2017  Chief Complaint  Patient presents with  . Follow-up    Pt states her breathing has become worse and is becoming SOB more often. Pt also has complaints of productive cough. Denies any CP. Pt was prescribed prednisone and abx to help with copd exacerbation.    84 year old female on hospice care for end-stage COPD but not on oxygen.  She is here with the hospice volunteer.  She tells me overall she is stable although she is got significant amount of fatigue.  She is really bothered by the fatigue.  She says that the hospice care is the only thing keeping her  alive.  She does not want to get rid of hospice care.  She is compliant with her medications for COPD.  She wants a fatigue workup.  Her last labs was with primary care physician in 2017.  She does not have a primary care physician right now.     CAT COPD Symptom & Quality of Life Score (GSK trademark) 0 is no burden. 5 is highest burden 02/21/2017  05/08/2017   Never Cough -> Cough all the time 3 4  No phlegm in chest -> Chest is full of phlegm 3 3  No chest tightness -> Chest feels very tight 2 2  No dyspnea for 1 flight stairs/hill -> Very dyspneic for 1 flight of stairs 5 5  No limitations for ADL at home -> Very limited with ADL at home 3 3  Confident leaving home -> Not at all confident leaving home 1 0  Sleep soundly -> Do not sleep soundly because of lung condition 3 3  Lots of Energy -> No energy at all 4 4  TOTAL Score (max 40)  24 24     [place] Symptom Assessment Numerical Scale 0 is no problem -> 10 worst problem 02/21/2017    No Pain -> Worst pain 5  No Tiredness -> Worset tiredness 4  No Nausea -> Worst nausea 3  No Depression -> Worst depression 7  No Anxiety -> Worst Anxiety 7  No Drowsiness -> Worst Drowsiness 5  Best appetite-> Worst Appetitle 7  Best Feeling of well being -> Worst feeling 6  No dyspnea-> Worst dyspnea 8  Other problem (none -> severe) Friction rub from o2 tubing face , cheek and neck  Completed by           has a past medical history of Atrophic vaginitis, COPD (chronic obstructive pulmonary disease) ([place]), COPD, mild ([place]), Hemorrhoids, Hypertension, Microscopic hematuria, and Osteopenia.   reports that she quit smoking about 10 years ago. Her smoking use included cigarettes. She has a 13.50 pack-year smoking history. she has never used smokeless tobacco.  Past Surgical History:  Procedure Laterality Date  . APPENDECTOMY  1933  . CATARACT SURGERY    . CYSTOSCOPY    . THYROIDECTOMY, PARTIAL    . TONSILLECTOMY      Allergies   Allergen Reactions  . Sulfa Antibiotics Hives and Rash    Immunization History  Administered Date(s) Administered  . Influenza Split 12/14/2011, 12/18/2012  . Influenza Whole 12/20/2010  . Influenza, High Dose Seasonal PF 12/23/2015, 11/23/2016  . Influenza,inj,Quad PF,6+ Mos 11/26/2013, 12/20/2014  . Pneumococcal Conjugate-13 02/26/2013  . Pneumococcal Polysaccharide-23 10/13/2014    Family History  Problem Relation Age of Onset  . Diabetes Mother   . Hypertension Mother   .  Heart disease Father   . Hypertension Father   . Heart failure Father      Current Outpatient Medications:  .  ALPRAZolam (XANAX) 0.25 MG tablet, Take 1 tablet (0.25 mg total) by mouth 3 (three) times daily as needed for anxiety., Disp: 12 tablet, Rfl: 0 .  Calcium Carbonate-Vitamin D (CALCIUM + D PO), Take by mouth 2 (two) times daily.  , Disp: , Rfl:  .  cholecalciferol (VITAMIN D) 1000 UNITS tablet, Take 1,000 Units by mouth daily., Disp: , Rfl:  .  COMBIVENT RESPIMAT 20-100 MCG/ACT AERS respimat, Inhale 1 puff into the lungs every 6 (six) hours as needed for wheezing or shortness of breath., Disp: 1 Inhaler, Rfl: 5 .  furosemide (LASIX) 20 MG tablet, TAKE 1/2 TABLET BY MOUTH DAILY, Disp: 15 tablet, Rfl: 0 .  HYDROMET 5-1.5 MG/5ML syrup, TK 5  ML  PO Q  4  H PRN  COU, Disp: , Rfl: 0 .  ipratropium-albuterol (DUONEB) 0.5-2.5 (3) MG/3ML SOLN, Take 3 mLs by nebulization 4 (four) times daily. And as needed, Disp: 360 mL, Rfl: 11 .  losartan (COZAAR) 100 MG tablet, TAKE 1 TABLET BY MOUTH DAILY, Disp: 30 tablet, Rfl: 5 .  Multiple Vitamins-Minerals (MULTIVITAMIN GUMMIES ADULT PO), Take 1 tablet by mouth daily. , Disp: , Rfl:  .  Polyethylene Glycol 3350 (MIRALAX PO), Take 1 packet by mouth daily. , Disp: , Rfl:  .  Tiotropium Bromide-Olodaterol (STIOLTO RESPIMAT) 2.5-2.5 MCG/ACT AERS, Inhale 2 puffs into the lungs daily., Disp: 1 Inhaler, Rfl: 0 .  TRELEGY ELLIPTA 100-62.5-25 MCG/INH AEPB, INHALE 1 PUFF INTO  THE LUNGS QD., Disp: , Rfl: 4    Review of Systems     Objective:   Physical Exam Vitals:   05/08/17 1231  BP: 124/74  Pulse: (!) 101  SpO2: 92%  Weight: 125 lb 3.2 oz (56.8 kg)  Height: 5\' 1"  (1.549 m)    Estimated body mass index is 23.66 kg/m as calculated from the following:   Height as of this encounter: 5\' 1"  (1.549 m).   Weight as of this encounter: 125 lb 3.2 oz (56.8 kg).   General Appearance:    Looks fatigued, in wheel chair. Chatty as usual. No o2  Head:    Normocephalic, without obvious abnormality, atraumatic  Eyes:    PERRL - yes, conjunctiva/corneas - clear      Ears:    Normal external ear canals, both ears  Nose:   NG tube - no  Throat:  ETT TUBE - no , OG tube - no  Neck:   Supple,  No enlargement/tenderness/nodules     Lungs:     Clear to auscultation bilaterally, Purse lip breathing +  Chest wall:    No deformity  Heart:    S1 and S2 normal, no murmur, CVP - no.  Pressors - no  Abdomen:     Soft, no masses, no organomegaly  Genitalia:    Not done  Rectal:   not done  Extremities:   Extremities- intact     Skin:   Intact in exposed areas .      Neurologic:   . Moves all 4s - yes. CAM-ICU - neg . Orientation - x3 +         Assessment:       ICD-10-CM   1. Stage 4 very severe COPD by GOLD classification ([place]) J44.9   2. Hospice care patient Z51.5   3. Chronic fatigue R53.82  Plan:     Stage 4 very severe COPD by GOLD classification ([place]) Hospice care patient   - stable copd and cat score  - cotninue hospice care and regular medicines  Chronic fatigue - new issues 0- check cbc, bmet, lft, mag, phos, TSH, Vit D, HgbA1c - will call with results  Followup - 6 months or sooner if needed   Dr. Brand Males, M.D., [facility].C.P Pulmonary and Critical Care Medicine Staff Physician, [place] Director - Interstitial Lung Disease  Program  Pulmonary [place] at [place], [place], [address]  Pager: [phone], If no answer or between  15:00h - 7:00h: call [phone] Telephone: [phone]

## 2017-05-09 ENCOUNTER — Telehealth: Payer: Self-pay | Admitting: Internal Medicine

## 2017-05-09 LAB — PATHOLOGIST SMEAR REVIEW

## 2017-05-09 NOTE — Telephone Encounter (Signed)
Faxed requested notes to [facility], nothing further needed.

## 2017-07-21 ENCOUNTER — Telehealth: Payer: Self-pay | Admitting: Internal Medicine

## 2017-07-21 NOTE — Telephone Encounter (Signed)
Called Nancy from [place] and stated to her MR stated it was fine for [place] to give a verbal for continuation of care.  Izora Gala expressed understanding. Nothing further needed at this time.

## 2017-07-28 ENCOUNTER — Other Ambulatory Visit: Payer: Self-pay | Admitting: Internal Medicine

## 2017-08-23 ENCOUNTER — Other Ambulatory Visit: Payer: Self-pay | Admitting: Internal Medicine

## 2017-09-15 ENCOUNTER — Telehealth: Payer: Self-pay | Admitting: Internal Medicine

## 2017-09-15 NOTE — Telephone Encounter (Signed)
Attempted to call patient today regarding Rx for Hydromet Q4 PRN to be called in for this pt. I did not receive an answer at time of call. I have left a voicemail message for pt to return call. X1

## 2017-09-18 MED ORDER — HYDROMET 5-1.5 MG/5ML PO SYRP: 5.0000 mL | ORAL_SOLUTION | ORAL | 0 refills | Status: DC | PRN

## 2017-09-18 NOTE — Telephone Encounter (Signed)
Attempted to call [facility] with Hospice. I did not receive an answer. I have left a message for Ria Comment to return our call.

## 2017-09-18 NOTE — Telephone Encounter (Signed)
Spoke with Bow from Hospice. Pt is needing a refill on Hydromet. I can't tell when this was last filled, it doesn't look like it came from our office.  CY - please advise if you are okay with filling this as MR is not available. Thanks.

## 2017-09-18 NOTE — Telephone Encounter (Signed)
Rx has been returned to triage and faxed to [facility]. Nothing further was needed.

## 2017-09-18 NOTE — Telephone Encounter (Signed)
Ria Comment from [facility] [phone]

## 2017-09-18 NOTE — Telephone Encounter (Signed)
Rx has been printed and placed on CY's cart. Will route message to [facility] for follow up.

## 2017-09-18 NOTE — Telephone Encounter (Signed)
Ok to refill 

## 2017-10-02 ENCOUNTER — Telehealth: Payer: Self-pay | Admitting: Internal Medicine

## 2017-10-02 NOTE — Telephone Encounter (Signed)
Called and spoke with patient, advised patient that I have placed samples up front. Patient verbalized understanding. Nothing further needed.

## 2017-10-06 ENCOUNTER — Telehealth: Payer: Self-pay | Admitting: Internal Medicine

## 2017-10-06 MED ORDER — PREDNISONE 10 MG PO TABS: ORAL_TABLET | ORAL | 0 refills | Status: DC

## 2017-10-06 MED ORDER — CEPHALEXIN 500 MG PO CAPS: 500.0000 mg | ORAL_CAPSULE | Freq: Three times a day (TID) | ORAL | 0 refills | Status: DC

## 2017-10-06 NOTE — Telephone Encounter (Signed)
Called and spoke with hospice nurse. She states that the patient is having increased SOB and wheezing. Patient is coughing up thick sputum. She is requesting something to be called in for the patient. No other symptoms present.

## 2017-10-06 NOTE — Telephone Encounter (Signed)
Medication sent to pharmacy. Patient is aware. Nothing further needed.

## 2017-10-06 NOTE — Telephone Encounter (Signed)
aecopd  cephalexin 500mg  three times daily x  5 days  Please take Take prednisone 40mg  once daily x 3 days, then 30mg  once daily x 3 days, then 20mg  once daily x 3 days, then prednisone 10mg  once daily  x 3 days and stop

## 2017-10-12 ENCOUNTER — Telehealth: Payer: Self-pay | Admitting: Internal Medicine

## 2017-10-12 NOTE — Telephone Encounter (Signed)
Called Ria Comment and stated to her since pt has had no improvement with SOB since finishing abx and with her almost done with pred, we needed to get her to come in for an appt to address symptoms.  Ria Comment expressed understanding and stated she figured that was going to happen.  Per Ria Comment, pt needed an appt Monday preferably in the afternoon.  Scheduled pt an acute visit with Wyn Quaker, NP Monday, 7/29 at 2pm.  Ria Comment stated to me she was going to call pt to make her aware of the OV.  Nothing further needed.

## 2017-10-16 ENCOUNTER — Ambulatory Visit (INDEPENDENT_AMBULATORY_CARE_PROVIDER_SITE_OTHER): Admitting: Nurse Practitioner

## 2017-10-16 ENCOUNTER — Other Ambulatory Visit (INDEPENDENT_AMBULATORY_CARE_PROVIDER_SITE_OTHER): Payer: Medicare Other

## 2017-10-16 ENCOUNTER — Ambulatory Visit (INDEPENDENT_AMBULATORY_CARE_PROVIDER_SITE_OTHER)
Admission: RE | Admit: 2017-10-16 | Discharge: 2017-10-16 | Disposition: A | Discharge status: Home / Self Care | Payer: Medicare Other | Source: Ambulatory Visit | Attending: Acute Care | Admitting: Acute Care

## 2017-10-16 ENCOUNTER — Encounter: Payer: Self-pay | Admitting: Nurse Practitioner

## 2017-10-16 VITALS — BP 118/70 | HR 94 | Ht 61.0 in | Wt 122.2 lb

## 2017-10-16 DIAGNOSIS — J9622 Acute and chronic respiratory failure with hypercapnia: Secondary | ICD-10-CM

## 2017-10-16 DIAGNOSIS — J449 Chronic obstructive pulmonary disease, unspecified: Secondary | ICD-10-CM

## 2017-10-16 DIAGNOSIS — J441 Chronic obstructive pulmonary disease with (acute) exacerbation: Secondary | ICD-10-CM

## 2017-10-16 DIAGNOSIS — K219 Gastro-esophageal reflux disease without esophagitis: Secondary | ICD-10-CM

## 2017-10-16 DIAGNOSIS — R0602 Shortness of breath: Secondary | ICD-10-CM

## 2017-10-16 DIAGNOSIS — J9621 Acute and chronic respiratory failure with hypoxia: Secondary | ICD-10-CM

## 2017-10-16 LAB — CBC WITH DIFFERENTIAL/PLATELET
BASOS ABS: 0 10*3/uL (ref 0.0–0.1)
Basophils Relative: 0.3 % (ref 0.0–3.0)
EOS PCT: 0.1 % (ref 0.0–5.0)
Eosinophils Absolute: 0 10*3/uL (ref 0.0–0.7)
HEMATOCRIT: 42.3 % (ref 36.0–46.0)
HEMOGLOBIN: 14.2 g/dL (ref 12.0–15.0)
LYMPHS ABS: 0.7 10*3/uL (ref 0.7–4.0)
LYMPHS PCT: 6.9 % — AB (ref 12.0–46.0)
MCHC: 33.6 g/dL (ref 30.0–36.0)
MCV: 92.9 fl (ref 78.0–100.0)
MONOS PCT: 4.2 % (ref 3.0–12.0)
Monocytes Absolute: 0.4 10*3/uL (ref 0.1–1.0)
NEUTROS PCT: 88.5 % — AB (ref 43.0–77.0)
Neutro Abs: 9.3 10*3/uL — ABNORMAL HIGH (ref 1.4–7.7)
Platelets: 314 10*3/uL (ref 150.0–400.0)
RBC: 4.56 Mil/uL (ref 3.87–5.11)
RDW: 14.2 % (ref 11.5–15.5)
WBC: 10.5 10*3/uL (ref 4.0–10.5)

## 2017-10-16 LAB — BASIC METABOLIC PANEL
BUN: 16 mg/dL (ref 6–23)
CO2: 33 mEq/L — ABNORMAL HIGH (ref 19–32)
Calcium: 9.4 mg/dL (ref 8.4–10.5)
Chloride: 95 mEq/L — ABNORMAL LOW (ref 96–112)
Creatinine, Ser: 0.89 mg/dL (ref 0.40–1.20)
GFR: 63.74 mL/min (ref >60.00)
Glucose, Bld: 129 mg/dL — ABNORMAL HIGH (ref 70–99)
POTASSIUM: 4.4 meq/L (ref 3.5–5.1)
SODIUM: 135 meq/L (ref 135–145)

## 2017-10-16 MED ORDER — LEVALBUTEROL HCL 0.63 MG/3ML IN NEBU
0.6300 mg | INHALATION_SOLUTION | RESPIRATORY_TRACT | Status: AC
  Administered 2017-10-16: 0.63 mg via RESPIRATORY_TRACT

## 2017-10-16 NOTE — Progress Notes (Signed)
@Patient  ID: Sierra Warner, female    DOB: [birth year], 84 y.o.   MRN: [id number]  Chief Complaint  Patient presents with  . Acute Visit    not feeling better after abx, still having SOB, clearing throat, more mucus, hoarness comes and goes    Referring provider: No ref. provider found  HPI:  Shortness of Breath  Chronicity: acute on chronic. The current episode started 1 to 4 weeks ago. The problem occurs daily. Progression since onset: no significant improvement after antibiotics and prednisone taper. Associated symptoms include leg pain (left lower) and wheezing. Pertinent negatives include no chest pain, fever, leg swelling or vomiting. The symptoms are aggravated by any activity. Risk factors include prolonged immobilization. Treatments tried: trelegy and duonebs (only uses once daily) The treatment provided no relief. Her past medical history is significant for COPD and a heart failure. There is no history of DVT or a recent surgery.     Recent [facility] [place] [facility] Encounters:     84 year old female patient on hospice care for end-stage COPD. Patient of Dr. [name]  05/08/2017-office visit-Ramaswamy stage IV COPD.  Stable today as well as on CAT score. Plan: Continue hospice, continue medications, follow-up in 6 months, lab work today  10-06-17 - patient call Ria Comment, Nurse with Hospice, stated pt is having wheezing and SOB. Sat's at 83 w/O2 - Dr. [name] ordered cephalexin 500mg  three times daily x  5 days with prednisone taper.   10-12-17 - Patient call - stating pt has had no improvement with SOB since finishing abx and prednisone   Tests:  Imaging:   CT - 06-16-17  IMPRESSION: 1. No acute pulmonary embolus. Aortic atherosclerosis without aneurysm. 2. Right lower lobe bronchitic change with mucus noted in the distal right main stem bronchus extending into the right lower lobe bronchi. Minimal mucus inspissation/impaction to the lateral segment of right lower  lobe       Allergies  Allergen Reactions  . Sulfa Antibiotics Hives and Rash    Immunization History  Administered Date(s) Administered  . Influenza Split 12/14/2011, 12/18/2012  . Influenza Whole 12/20/2010  . Influenza, High Dose Seasonal PF 12/23/2015, 11/23/2016  . Influenza,inj,Quad PF,6+ Mos 11/26/2013, 12/20/2014  . Pneumococcal Conjugate-13 02/26/2013  . Pneumococcal Polysaccharide-23 10/13/2014    Past Medical History:  Diagnosis Date  . Atrophic vaginitis   . COPD (chronic obstructive pulmonary disease) ([place])   . COPD, mild ([place])   . Hemorrhoids   . Hypertension   . Microscopic hematuria   . Osteopenia     Tobacco History: Social History   Tobacco Use  Smoking Status Former Smoker  . Packs/day: 0.30  . Years: 45.00  . Pack years: 13.50  . Types: Cigarettes  . Last attempt to quit: 03/22/2007  . Years since quitting: 10.5  Smokeless Tobacco Never Used   Former smoker - quit in 2009  Outpatient Encounter Medications as of 10/16/2017  Medication Sig  . ALPRAZolam (XANAX) 0.25 MG tablet Take 1 tablet (0.25 mg total) by mouth 3 (three) times daily as needed for anxiety.  . Calcium Carbonate-Vitamin D (CALCIUM + D PO) Take by mouth 2 (two) times daily.    . cephALEXin (KEFLEX) 500 MG capsule Take 1 capsule (500 mg total) by mouth 3 (three) times daily.  . cholecalciferol (VITAMIN D) 1000 UNITS tablet Take 1,000 Units by mouth daily.  . COMBIVENT RESPIMAT 20-100 MCG/ACT AERS respimat Inhale 1 puff into the lungs every 6 (six) hours as needed for wheezing or  shortness of breath.  . furosemide (LASIX) 20 MG tablet TAKE 1/2 TABLET BY MOUTH DAILY  . HYDROMET 5-1.5 MG/5ML syrup Take 5 mLs by mouth every 4 (four) hours as needed for cough.  Marland Kitchen ipratropium-albuterol (DUONEB) 0.5-2.5 (3) MG/3ML SOLN Take 3 mLs by nebulization 4 (four) times daily. And as needed  . losartan (COZAAR) 100 MG tablet TAKE 1 TABLET BY MOUTH DAILY  . Multiple Vitamins-Minerals (MULTIVITAMIN  GUMMIES ADULT PO) Take 1 tablet by mouth daily.   . Polyethylene Glycol 3350 (MIRALAX PO) Take 1 packet by mouth daily.   . predniSONE (DELTASONE) 10 MG tablet Take 40 mg for 3 days then 30 mg for 3 days then 20 mg for 3 days then 10 mg for 3 days then STOP  . TRELEGY ELLIPTA 100-62.5-25 MCG/INH AEPB INHALE 1 PUFF INTO THE LUNGS QD.  Marland Kitchen TRELEGY ELLIPTA 100-62.5-25 MCG/INH AEPB INHALE 1 PUFF INTO THE LUNGS EVERY DAY.  . [DISCONTINUED] Tiotropium Bromide-Olodaterol (STIOLTO RESPIMAT) 2.5-2.5 MCG/ACT AERS Inhale 2 puffs into the lungs daily.   No facility-administered encounter medications on file as of 10/16/2017.      Review of Systems  Review of Systems  Constitutional: Negative for fever.  Respiratory: Positive for shortness of breath and wheezing.   Cardiovascular: Negative for chest pain and leg swelling.  Gastrointestinal: Negative for vomiting.       Physical Exam  BP 118/70 (BP Location: Left Arm, Cuff Size: Normal)   Pulse 94   Ht 5\' 1"  (1.549 m)   Wt 122 lb 3.2 oz (55.4 kg)   SpO2 95%   BMI 23.09 kg/m   Wt Readings from Last 5 Encounters:  10/16/17 122 lb 3.2 oz (55.4 kg)  05/08/17 125 lb 3.2 oz (56.8 kg)  02/21/17 124 lb (56.2 kg)  11/23/16 125 lb (56.7 kg)  08/23/16 123 lb (55.8 kg)     Physical Exam  Constitutional: She is oriented to person, place, and time. She appears well-developed and well-nourished. No distress.  Cardiovascular: Normal rate and regular rhythm.  Pulmonary/Chest: She has decreased breath sounds in the right lower field and the left lower field. She has wheezes in the right upper field and the left upper field. She has rhonchi (scattered).  Neurological: She is alert and oriented to person, place, and time.  Psychiatric: She has a normal mood and affect.  Nursing note and vitals reviewed.    Lab Results:  CBC    Component Value Date/Time   WBC (H) 05/08/2017 1302    12.0 abnormal cells  noted on smear referred for pathology review.    RBC 4.28 05/08/2017 1302   HGB 13.3 05/08/2017 1302   HCT 39.3 05/08/2017 1302   PLT 392.0 05/08/2017 1302   MCV 91.8 05/08/2017 1302   MCH 30.9 06/14/2016 0644   MCHC 33.8 05/08/2017 1302   RDW 13.8 05/08/2017 1302   LYMPHSABS 1.8 05/08/2017 1302   MONOABS 1.4 (H) 05/08/2017 1302   EOSABS 0.3 05/08/2017 1302   BASOSABS 0.1 05/08/2017 1302    BMET    Component Value Date/Time   NA 131 (L) 05/08/2017 1302   K 5.0 05/08/2017 1302   CL 92 (L) 05/08/2017 1302   CO2 33 (H) 05/08/2017 1302   GLUCOSE 112 (H) 05/08/2017 1302   BUN 12 05/08/2017 1302   CREATININE 0.79 05/08/2017 1302   CALCIUM 9.0 05/08/2017 1302   GFRNONAA >60 06/16/2016 0632   GFRAA >60 06/16/2016 3785    BNP No results found for: BNP  ProBNP    Component Value Date/Time   PROBNP 543.2 (H) 12/20/2013 2104    Imaging: No results found.   Assessment & Plan:   No problem-specific Assessment & Plan notes found for this encounter.     Fenton Foy, NP 10/16/2017

## 2017-10-16 NOTE — Assessment & Plan Note (Addendum)
Worsening over past 10 days Treated with Keflex and prednisone with no improvement Patient admits to using duonebs once daily not as prescribed QID Patient with nonproductive cough  Plan: We will check CXR today. We will check labs today We will call you with results xopenex neb in office Will give a sample of trelegy mucinex with full glass of water May stop prednisone after tomorrows dose Will call with D-Dimer results [facility] [phone] Continue oxygen at 2L to keep O2 level at 88-92% If patient able to produce specimen with mucinex  - consider RVP Follow up in 2-3 weeks Follow sooner if symptoms worsen Consider PFT at next visit

## 2017-10-16 NOTE — Assessment & Plan Note (Signed)
Continue O2 at 2L [place] to maintain O2 sats at 88-92%

## 2017-10-16 NOTE — Assessment & Plan Note (Signed)
Suspect possible GERD component to cough Patient with frequent throat clearing in office today Sips of water instead of throat clearing pepcid 20mg  at bedtime Avoid mint and menthol - sugar free werthers or jolly ranchers for throat soothing

## 2017-10-16 NOTE — Patient Instructions (Addendum)
We will check CXR today. We will check labs today We will call you with results xopenex neb in office Sips of water instead of throat clearing pepcid 20mg  at bedtime Avoid mint and menthol - sugar free werthers or jolly ranchers for throat soothing Will give a sample of trelegy mucinex with full glass of water May stop prednisone after tomorrows dose Will call with D-Dimer results [facility] [phone] Continue oxygen at 2L to keep O2 level at 88-92% Follow up in 2-3 weeks Follow sooner if symptoms worsen

## 2017-10-16 NOTE — Progress Notes (Signed)
@Patient  ID: Sierra Warner, female    DOB: 1929/03/13, 84 y.o.   MRN: [id number]  Chief Complaint  Patient presents with  . Acute Visit    not feeling better after abx, still having SOB, clearing throat, more mucus, hoarness comes and goes    Referring provider: No ref. provider found  HPI:  Shortness of Breath  Chronicity: acute on chronic. The current episode started 1 to 4 weeks ago. The problem occurs daily. Progression since onset: no significant improvement after antibiotics and prednisone taper. Associated symptoms include leg pain (left lower) and wheezing. Pertinent negatives include no chest pain, fever, leg swelling or vomiting. The symptoms are aggravated by any activity. Risk factors include prolonged immobilization. Treatments tried: trelegy and duonebs (only uses once daily) The treatment provided no relief. Her past medical history is significant for COPD and a heart failure. There is no history of DVT or a recent surgery.     Recent [place] [facility] Encounters:     84 year old female patient on hospice care for end-stage COPD. Patient of Dr. [name]  05/08/2017-office visit-Ramaswamy stage IV COPD.  Stable today as well as on CAT score. Plan: Continue hospice, continue medications, follow-up in 6 months, lab work today  10-06-17 - patient call Ria Comment, Nurse with Hospice, stated pt is having wheezing and SOB. Sat's at 91 w/O2 - Dr. [name] ordered cephalexin 500mg  three times daily x  5 days with prednisone taper.   10-12-17 - Patient call - stating pt has had no improvement with SOB since finishing abx and prednisone   Tests:    Chest xray in office today - shows no active cardiopulmonary disease  10-16-17 D-dimer CBC BMP  Imaging:   CT - 06-16-17  IMPRESSION: 1. No acute pulmonary embolus. Aortic atherosclerosis without aneurysm. 2. Right lower lobe bronchitic change with mucus noted in the distal right main stem bronchus extending into the right  lower lobe bronchi. Minimal mucus inspissation/impaction to the lateral segment of right lower lobe       Allergies  Allergen Reactions  . Sulfa Antibiotics Hives and Rash    Immunization History  Administered Date(s) Administered  . Influenza Split 12/14/2011, 12/18/2012  . Influenza Whole 12/20/2010  . Influenza, High Dose Seasonal PF 12/23/2015, 11/23/2016  . Influenza,inj,Quad PF,6+ Mos 11/26/2013, 12/20/2014  . Pneumococcal Conjugate-13 02/26/2013  . Pneumococcal Polysaccharide-23 10/13/2014    Past Medical History:  Diagnosis Date  . Atrophic vaginitis   . COPD (chronic obstructive pulmonary disease) ([place])   . COPD, mild ([place])   . Hemorrhoids   . Hypertension   . Microscopic hematuria   . Osteopenia     Tobacco History: Social History   Tobacco Use  Smoking Status Former Smoker  . Packs/day: 0.30  . Years: 45.00  . Pack years: 13.50  . Types: Cigarettes  . Last attempt to quit: 03/22/2007  . Years since quitting: 10.5  Smokeless Tobacco Never Used   Former smoker - quit in 2009  Outpatient Encounter Medications as of 10/16/2017  Medication Sig  . ALPRAZolam (XANAX) 0.25 MG tablet Take 1 tablet (0.25 mg total) by mouth 3 (three) times daily as needed for anxiety.  . Calcium Carbonate-Vitamin D (CALCIUM + D PO) Take by mouth 2 (two) times daily.    . cephALEXin (KEFLEX) 500 MG capsule Take 1 capsule (500 mg total) by mouth 3 (three) times daily.  . cholecalciferol (VITAMIN D) 1000 UNITS tablet Take 1,000 Units by mouth daily.  Golden Hurter RESPIMAT  20-100 MCG/ACT AERS respimat Inhale 1 puff into the lungs every 6 (six) hours as needed for wheezing or shortness of breath.  . furosemide (LASIX) 20 MG tablet TAKE 1/2 TABLET BY MOUTH DAILY  . HYDROMET 5-1.5 MG/5ML syrup Take 5 mLs by mouth every 4 (four) hours as needed for cough.  Marland Kitchen ipratropium-albuterol (DUONEB) 0.5-2.5 (3) MG/3ML SOLN Take 3 mLs by nebulization 4 (four) times daily. And as needed  . losartan  (COZAAR) 100 MG tablet TAKE 1 TABLET BY MOUTH DAILY  . Multiple Vitamins-Minerals (MULTIVITAMIN GUMMIES ADULT PO) Take 1 tablet by mouth daily.   . Polyethylene Glycol 3350 (MIRALAX PO) Take 1 packet by mouth daily.   . predniSONE (DELTASONE) 10 MG tablet Take 40 mg for 3 days then 30 mg for 3 days then 20 mg for 3 days then 10 mg for 3 days then STOP  . TRELEGY ELLIPTA 100-62.5-25 MCG/INH AEPB INHALE 1 PUFF INTO THE LUNGS QD.  Marland Kitchen TRELEGY ELLIPTA 100-62.5-25 MCG/INH AEPB INHALE 1 PUFF INTO THE LUNGS EVERY DAY.  . [DISCONTINUED] Tiotropium Bromide-Olodaterol (STIOLTO RESPIMAT) 2.5-2.5 MCG/ACT AERS Inhale 2 puffs into the lungs daily.  . [EXPIRED] levalbuterol (XOPENEX) nebulizer solution 0.63 mg    No facility-administered encounter medications on file as of 10/16/2017.      Review of Systems  Review of Systems  Constitutional: Negative for fever.  Respiratory: Positive for shortness of breath and wheezing.   Cardiovascular: Negative for chest pain and leg swelling.  Gastrointestinal: Negative for vomiting.       Physical Exam  BP 118/70 (BP Location: Left Arm, Cuff Size: Normal)   Pulse 94   Ht 5\' 1"  (1.549 m)   Wt 122 lb 3.2 oz (55.4 kg)   SpO2 95%   BMI 23.09 kg/m   Wt Readings from Last 5 Encounters:  10/16/17 122 lb 3.2 oz (55.4 kg)  05/08/17 125 lb 3.2 oz (56.8 kg)  02/21/17 124 lb (56.2 kg)  11/23/16 125 lb (56.7 kg)  08/23/16 123 lb (55.8 kg)     Physical Exam  Constitutional: She is oriented to person, place, and time. She appears well-developed and well-nourished. No distress.  Cardiovascular: Normal rate and regular rhythm.  Pulmonary/Chest: She has decreased breath sounds in the right lower field and the left lower field. She has wheezes in the right upper field and the left upper field. She has rhonchi (scattered).  Neurological: She is alert and oriented to person, place, and time.  Psychiatric: She has a normal mood and affect.  Nursing note and vitals  reviewed.    Lab Results:  CBC    Component Value Date/Time   WBC (H) 05/08/2017 1302    12.0 abnormal cells  noted on smear referred for pathology review.   RBC 4.28 05/08/2017 1302   HGB 13.3 05/08/2017 1302   HCT 39.3 05/08/2017 1302   PLT 392.0 05/08/2017 1302   MCV 91.8 05/08/2017 1302   MCH 30.9 06/14/2016 0644   MCHC 33.8 05/08/2017 1302   RDW 13.8 05/08/2017 1302   LYMPHSABS 1.8 05/08/2017 1302   MONOABS 1.4 (H) 05/08/2017 1302   EOSABS 0.3 05/08/2017 1302   BASOSABS 0.1 05/08/2017 1302    BMET    Component Value Date/Time   NA 131 (L) 05/08/2017 1302   K 5.0 05/08/2017 1302   CL 92 (L) 05/08/2017 1302   CO2 33 (H) 05/08/2017 1302   GLUCOSE 112 (H) 05/08/2017 1302   BUN 12 05/08/2017 1302   CREATININE 0.79 05/08/2017  1302   CALCIUM 9.0 05/08/2017 1302   GFRNONAA >60 06/16/2016 0632   GFRAA >60 06/16/2016 0632    BNP No results found for: BNP  ProBNP    Component Value Date/Time   PROBNP 543.2 (H) 12/20/2013 2104    Imaging: Dg Chest 2 View  Result Date: 10/16/2017 CLINICAL DATA:  Dyspnea. EXAM: CHEST - 2 VIEW COMPARISON:  Radiographs of June 13, 2016. FINDINGS: The heart size and mediastinal contours are within normal limits. Atherosclerosis of thoracic aorta is noted. No pneumothorax or pleural effusion is noted. Both lungs are clear. The visualized skeletal structures are unremarkable. IMPRESSION: No active cardiopulmonary disease. Aortic Atherosclerosis (ICD10-I70.0). Electronically Signed   By: Marijo Conception, M.D.   On: 10/16/2017 14:47     Assessment & Plan:   Stage 4 very severe COPD by GOLD classification ([place]) Worsening over past 10 days Treated with Keflex and prednisone with no improvement Patient admits to using duonebs once daily not as prescribed QID Patient with nonproductive cough  Plan: We will check CXR today. We will check labs today We will call you with results xopenex neb in office Will give a sample of  trelegy mucinex with full glass of water May stop prednisone after tomorrows dose Will call with D-Dimer results [facility] [phone] Continue oxygen at 2L to keep O2 level at 88-92% If patient able to produce specimen with mucinex  - consider RVP Follow up in 2-3 weeks Follow sooner if symptoms worsen Consider PFT at next visit   Acute on chronic respiratory failure ([place]) Continue O2 at 2L [place] to maintain O2 sats at 88-92%   GERD (gastroesophageal reflux disease) Suspect possible GERD component to cough Patient with frequent throat clearing in office today Sips of water instead of throat clearing pepcid 20mg  at bedtime Avoid mint and menthol - sugar free werthers or jolly ranchers for throat soothing      Fenton Foy, NP 10/16/2017

## 2017-10-17 ENCOUNTER — Telehealth: Payer: Self-pay | Admitting: Internal Medicine

## 2017-10-17 LAB — D-DIMER, QUANTITATIVE: D-Dimer, Quant: <0.19 mcg/mL FEU (ref <0.50)

## 2017-10-17 NOTE — Telephone Encounter (Signed)
OV note has been faxed to [place] with [facility]. Nothing further was needed.

## 2017-10-21 ENCOUNTER — Other Ambulatory Visit: Payer: Self-pay | Admitting: Internal Medicine

## 2017-10-30 ENCOUNTER — Ambulatory Visit (INDEPENDENT_AMBULATORY_CARE_PROVIDER_SITE_OTHER): Admitting: Acute Care

## 2017-10-30 ENCOUNTER — Encounter: Payer: Self-pay | Admitting: Acute Care

## 2017-10-30 DIAGNOSIS — J441 Chronic obstructive pulmonary disease with (acute) exacerbation: Secondary | ICD-10-CM

## 2017-10-30 MED ORDER — FLUTICASONE-UMECLIDIN-VILANT 100-62.5-25 MCG/INH IN AEPB: 1.0000 | INHALATION_SPRAY | Freq: Every day | RESPIRATORY_TRACT | 0 refills | Status: DC

## 2017-10-30 NOTE — Patient Instructions (Addendum)
It is good to see you today. Continue to use the Trelegy 1 puff once daily Rinse mouth after use  Please use your portable oxygen at 2 L with activity Saturation goals are 88-92% Please make sure you get the parts to ensure the portable oxygen is working properly. Continue neb treatments as needed for shortness of breath of wheezing. Continue to use your rescue inhaler as needed for shortness of breath or wheezing. It is ok to stop acid reflux pill and mucinex if you feel they are not helping. Follow up with Dr. [name] in September as is already scheduled. Please contact office for sooner follow up if symptoms do not improve or worsen or seek emergency care

## 2017-10-30 NOTE — Assessment & Plan Note (Addendum)
Resolving flare Did not do well with prednisone Continued dyspnea with exertion>> almost back to baseline Plan: Continue to use the Trelegy 1 puff once daily Rinse mouth after use  Please use your portable oxygen at 2 L with activity Saturation goals are 88-92% Please make sure you get the parts to ensure the portable oxygen is working properly. Continue neb treatments as needed for shortness of breath of wheezing. Continue to use your rescue inhaler as needed for shortness of breath or wheezing. It is ok to stop acid reflux pill and mucinex if you feel they are not helping. Follow up with Dr. [name] in September as is already scheduled. Please contact office for sooner follow up if symptoms do not improve or worsen or seek emergency care

## 2017-10-30 NOTE — Progress Notes (Signed)
History of Present Illness Sierra Warner is a 84 y.o. female former smoker with COPD . She is followed by Dr. Chase Warner.   10/30/2017  Follow up for COPD exacerbation. She was seen by Sierra Arms NP 10/16/2017. She had been previously treated with Keflex and prednisone for a COPD exacerbation by Dr. Chase Warner. She has completed the medications. D-Dimer was negative. Her maintenance medication is  Trelegy.She is in the donut hole, and is requesting samples. . She states she is using the Mucinex twice daily and she is taking the reflux medication. She does not think they help her breathing . She has been trying to use the nebs twice daily. She states they are too much work. Take too long to prepare and administer, and clean up of her nebs is time consuming.. She states she is experiencing worsening shortness of breath with exertion. She thinks the heat may be an element of her problem. She states her oxygen saturations rarely drop below 90%. She is using her portable oxygen as needed for shortness of breath and at night.  She states her portable oxygen is not working properly. She has ordered the replacement parts.She is using the Trelegy and she states she cannot tell any difference in her breathing. She states she did prefer the combivent.She feels the prednisone taper she took did not agree with her. She states she is at her baseline for her end stage COPD. Marland Kitchen She denies fever, chest pain, orthopnea or hemoptysis.   Test Results: CXR  10/30/2017>> No active cardiopulmonary disease.   CBC Latest Ref Rng & Units 10/16/2017 05/08/2017 06/14/2016  WBC 4.0 - 10.5 K/uL 10.5 12.0 abnormal cells  noted on smear referred for pathology review.(H) 13.6(H)  Hemoglobin 12.0 - 15.0 g/dL 14.2 13.3 13.0  Hematocrit 36.0 - 46.0 % 42.3 39.3 37.9  Platelets 150.0 - 400.0 K/uL 314.0 392.0 260    BMP Latest Ref Rng & Units 10/16/2017 05/08/2017 06/16/2016  Glucose 70 - 99 mg/dL 129(H) 112(H) 85  BUN 6 - 23 mg/dL 16 12  17   Creatinine 0.40 - 1.20 mg/dL 0.89 0.79 0.76  Sodium 135 - 145 mEq/L 135 131(L) 134(L)  Potassium 3.5 - 5.1 mEq/L 4.4 5.0 4.3  Chloride 96 - 112 mEq/L 95(L) 92(L) 94(L)  CO2 19 - 32 mEq/L 33(H) 33(H) 32  Calcium 8.4 - 10.5 mg/dL 9.4 9.0 8.9    BNP No results found for: BNP  ProBNP    Component Value Date/Time   PROBNP 543.2 (H) 12/20/2013 2104    PFT    Component Value Date/Time   FEV1PRE 0.75 08/28/2015 1413   FEV1POST 0.72 08/28/2015 1413   FVCPRE 2.06 08/28/2015 1413   FVCPOST 1.95 08/28/2015 1413   TLC 4.87 08/28/2015 1413   DLCOUNC 8.71 08/28/2015 1413   PREFEV1FVCRT 37 08/28/2015 1413   PSTFEV1FVCRT 37 08/28/2015 1413    Dg Chest 2 View  Result Date: 10/16/2017 CLINICAL DATA:  Dyspnea. EXAM: CHEST - 2 VIEW COMPARISON:  Radiographs of June 13, 2016. FINDINGS: The heart size and mediastinal contours are within normal limits. Atherosclerosis of thoracic aorta is noted. No pneumothorax or pleural effusion is noted. Both lungs are clear. The visualized skeletal structures are unremarkable. IMPRESSION: No active cardiopulmonary disease. Aortic Atherosclerosis (ICD10-I70.0). Electronically Signed   By: Sierra Warner, M.D.   On: 10/16/2017 14:47     Past medical hx Past Medical History:  Diagnosis Date  . Atrophic vaginitis   . COPD (chronic obstructive pulmonary disease) ([place])   .  COPD, mild ([place])   . Hemorrhoids   . Hypertension   . Microscopic hematuria   . Osteopenia      Social History   Tobacco Use  . Smoking status: Former Smoker    Packs/day: 0.30    Years: 45.00    Pack years: 13.50    Types: Cigarettes    Last attempt to quit: 03/22/2007    Years since quitting: 10.6  . Smokeless tobacco: Never Used  Substance Use Topics  . Alcohol use: Yes    Alcohol/week: 4.0 standard drinks    Types: 4 Standard drinks or equivalent per week  . Drug use: No    Ms.Sierra Warner reports that she quit smoking about 10 years ago. Her smoking use included cigarettes.  She has a 13.50 pack-year smoking history. She has never used smokeless tobacco. She reports that she drinks about 4.0 standard drinks of alcohol per week. She reports that she does not use drugs.  Tobacco Cessation: Former smoker quit 2009 with a 13.5  pack year smoking history  Past surgical hx, Family hx, Social hx all reviewed.  Current Outpatient Medications on File Prior to Visit  Medication Sig  . ALPRAZolam (XANAX) 0.25 MG tablet Take 1 tablet (0.25 mg total) by mouth 3 (three) times daily as needed for anxiety.  . Calcium Carbonate-Vitamin D (CALCIUM + D PO) Take by mouth 2 (two) times daily.    . cholecalciferol (VITAMIN D) 1000 UNITS tablet Take 1,000 Units by mouth daily.  . COMBIVENT RESPIMAT 20-100 MCG/ACT AERS respimat Inhale 1 puff into the lungs every 6 (six) hours as needed for wheezing or shortness of breath.  . furosemide (LASIX) 20 MG tablet TAKE 1/2 TABLET BY MOUTH DAILY  . HYDROMET 5-1.5 MG/5ML syrup Take 5 mLs by mouth every 4 (four) hours as needed for cough.  Marland Kitchen ipratropium-albuterol (DUONEB) 0.5-2.5 (3) MG/3ML SOLN TAKE 3 ML VIA NEBULIZATION FOUR TIMES DAILY AS NEEDED.  Marland Kitchen losartan (COZAAR) 100 MG tablet TAKE 1 TABLET BY MOUTH DAILY  . Multiple Vitamins-Minerals (MULTIVITAMIN GUMMIES ADULT PO) Take 1 tablet by mouth daily.   . Polyethylene Glycol 3350 (MIRALAX PO) Take 1 packet by mouth daily.   . TRELEGY ELLIPTA 100-62.5-25 MCG/INH AEPB INHALE 1 PUFF INTO THE LUNGS EVERY DAY.  . cephALEXin (KEFLEX) 500 MG capsule Take 1 capsule (500 mg total) by mouth 3 (three) times daily. (Patient not taking: Reported on 10/30/2017)  . predniSONE (DELTASONE) 10 MG tablet Take 40 mg for 3 days then 30 mg for 3 days then 20 mg for 3 days then 10 mg for 3 days then STOP (Patient not taking: Reported on 10/30/2017)   No current facility-administered medications on file prior to visit.      Allergies  Allergen Reactions  . Sulfa Antibiotics Hives and Rash    Review Of  Systems:  Constitutional:   No  weight loss, night sweats,  Fevers, chills, fatigue, or  lassitude.  HEENT:   No headaches,  Difficulty swallowing,  Tooth/dental problems, or  Sore throat,                No sneezing, itching, ear ache, nasal congestion, post nasal drip,   CV:  No chest pain,  Orthopnea, PND, swelling in lower extremities, anasarca, dizziness, palpitations, syncope.   GI  No heartburn, indigestion, abdominal pain, nausea, vomiting, diarrhea, change in bowel habits, loss of appetite, bloody stools.   Resp: + shortness of breath with exertion less at rest.  No excess mucus,  no productive cough,  No non-productive cough,  No coughing up of blood.  No change in color of mucus.  No wheezing.  No chest wall deformity  Skin: no rash or lesions.  GU: no dysuria, change in color of urine, no urgency or frequency.  No flank pain, no hematuria   MS:  No joint pain or swelling.  No decreased range of motion.  No back pain.  Psych:  No change in mood or affect. No depression or anxiety.  No memory loss.   Vital Signs BP 128/70 (BP Location: Right Arm, Cuff Size: Normal)   Pulse (!) 101   Ht 5\' 1"  (1.549 m)   Wt 123 lb 6.4 oz (56 kg)   SpO2 93%   BMI 23.32 kg/m    Physical Exam:  General- No distress,  A&Ox3, pleasant ENT: No sinus tenderness, TM clear, pale nasal mucosa, no oral exudate,no post nasal drip, no LAN Cardiac: S1, S2, regular rate and rhythm, no murmur Chest: No wheeze/ rales/ dullness; no accessory muscle use, no nasal flaring, no sternal retractions, diminished per bases. Abd.: Soft Non-tender, ND, BS + Ext: No clubbing cyanosis, edema Neuro:  Deconditioned at baseline, MAE x 4, A&O x 3 Skin: No rashes, warm and dry and intact Psych: normal mood and behavior   Assessment/Plan  COPD exacerbation (HCC) Resolving flare Did not do well with prednisone Continued dyspnea with exertion>> almost back to baseline Plan: Continue to use the Trelegy 1 puff  once daily Rinse mouth after use  Please use your portable oxygen at 2 L with activity Saturation goals are 88-92% Please make sure you get the parts to ensure the portable oxygen is working properly. Continue neb treatments as needed for shortness of breath of wheezing. Continue to use your rescue inhaler as needed for shortness of breath or wheezing. It is ok to stop acid reflux pill and mucinex if you feel they are not helping. Follow up with Dr. Chase Warner in September as is already scheduled. Please contact office for sooner follow up if symptoms do not improve or worsen or seek emergency care    Magdalen Spatz, NP 10/30/2017  1:57 PM

## 2017-11-15 ENCOUNTER — Other Ambulatory Visit: Payer: Self-pay | Admitting: Internal Medicine

## 2017-11-22 ENCOUNTER — Ambulatory Visit (INDEPENDENT_AMBULATORY_CARE_PROVIDER_SITE_OTHER): Payer: Medicare Other | Admitting: Internal Medicine

## 2017-11-22 ENCOUNTER — Encounter: Payer: Self-pay | Admitting: Internal Medicine

## 2017-11-22 VITALS — BP 114/64 | HR 87 | Ht 61.0 in | Wt 122.6 lb

## 2017-11-22 DIAGNOSIS — J9611 Chronic respiratory failure with hypoxia: Secondary | ICD-10-CM

## 2017-11-22 DIAGNOSIS — Z23 Encounter for immunization: Secondary | ICD-10-CM

## 2017-11-22 DIAGNOSIS — J449 Chronic obstructive pulmonary disease, unspecified: Secondary | ICD-10-CM

## 2017-11-22 NOTE — Patient Instructions (Addendum)
ICD-10-CM   1. Stage 4 very severe COPD by GOLD classification ([place]) J44.9   2. Chronic respiratory failure with hypoxia (HCC) J96.11     Stable disease  Plan High dose flu shot 11/22/2017 Continue trelegy scheduld + neb prn Discussed abg for high co2 eval - respect deferral Continue hospice but do not be surprised if you become temporarily hospice ineligible  Followup 3 months or sooner if needed

## 2017-11-22 NOTE — Progress Notes (Signed)
Patient ID: Sierra Warner, female   DOB: 11-17-1928, 84 y.o.   MRN: [id number]           OV 08/23/2016  Chief Complaint  Patient presents with  . Follow-up    Pt states her breathing is unchanged since last OV. Pt c/o cough with little mucus production. Pt denies CP/tightness and f/c/s.   84 year old female Gold stage IV COPD. She is now under the care of hospice which is paradoxically of the functional status. She is driving. She is on oxygen. She is on nebulizers. Her new complaint is that she's got bilateral pedal edema worse in the daytime as the day progresses. Better with night and at rest. She is unaware she has bilateral varicose veins. Initially she was reluctant to take hospice a few months ago but now she says she's loving it because of the extra layer of supportive provided. She is now worried that she will live past 6 months expectancy and she will "fired" from the hospice. Nevertheless have told her that with her advanced COPD the prognosis is less than 6 months   OV 11/23/2016  Chief Complaint  Patient presents with  . Follow-up    Pt states her breathing is unchanged since last OV. Pt c/o mild prod cough with little mucus production - white in color. Pt denies CP/tightness and f/c/s.    Advanced COPD on home hospice  She feels hospice is working really well for her and has improved her functional status. She is worried that her hospice services might be terminated because of her good functional status. She is on nebulizers. She prefers inhalers and is asking for samples. She says the only thing frustrating about hospice is that just be on nebulizers. Nevertheless overall program has helped her. She will have flu shot today. Walk walking desaturation test on an 185 feet 3 laps on room air: She only did 2 laps and stopped because of dyspnea but more so leg pain. She started off at a pulse rate of 91/m final heart rate was similar. Resting pulse ox was 91%. Final pulse ox was 88% at  end of second lap   OV 02/21/2017  Chief Complaint  Patient presents with  . Follow-up    Pt states that she has her good days and bad days. Has complaints of SOB with exertion, occ. cough that can range with mucus of white to yellow mucus. Denies any CP.    Stage IV COPD patient with exertional hypoxemia on hospice care  Overall COPD stable.  She has not had any exacerbation.  She has multiple systemic complaints all documented below.  Particularly she is having some friction rub in her cheek and her left neck from the oxygen tubing.  She plans to see dermatology for this.  She applies expensive cosmetic fashion oriented face creams.  Such as Alvino Blood and Shelly Flatten.  However she does not apply any petroleum jelly to the area of friction rub.  Smoking continues to be under remission.  She is up-to-date with her flu shot.  She enjoys the hospice services and this has helped to improve her functional status.  She is able to drive.  However she does not want to give up hospice services.  Explained the concept of hospice and recertification.  She seems more amenable to lose hospice services if they deem so.  She will be open to palliative support.  But for now she wants to continue hospice service.  She is also  complaining that the trilogy  inhalers   expensive because of donut hole and she wants samples.   OV 05/08/2017  Chief Complaint  Patient presents with  . Follow-up    Pt states her breathing has become worse and is becoming SOB more often. Pt also has complaints of productive cough. Denies any CP. Pt was prescribed prednisone and abx to help with copd exacerbation.    84 year old female on hospice care for end-stage COPD but not on oxygen.  She is here with the hospice volunteer.  She tells me overall she is stable although she is got significant amount of fatigue.  She is really bothered by the fatigue.  She says that the hospice care is the only thing keeping her alive.  She does  not want to get rid of hospice care.  She is compliant with her medications for COPD.  She wants a fatigue workup.  Her last labs was with primary care physician in 2017.  She does not have a primary care physician right now.   10/30/2017  Follow up for COPD exacerbation. She was seen by Lazaro Arms NP 10/16/2017. She had been previously treated with Keflex and prednisone for a COPD exacerbation by Dr. [name]. She has completed the medications. D-Dimer was negative. Her maintenance medication is  Trelegy.She is in the donut hole, and is requesting samples. . She states she is using the Mucinex twice daily and she is taking the reflux medication. She does not think they help her breathing . She has been trying to use the nebs twice daily. She states they are too much work. Take too long to prepare and administer, and clean up of her nebs is time consuming.. She states she is experiencing worsening shortness of breath with exertion. She thinks the heat may be an element of her problem. She states her oxygen saturations rarely drop below 90%. She is using her portable oxygen as needed for shortness of breath and at night.  She states her portable oxygen is not working properly. She has ordered the replacement parts.She is using the Trelegy and she states she cannot tell any difference in her breathing. She states she did prefer the combivent.She feels the prednisone taper she took did not agree with her. She states she is at her baseline for her end stage COPD. Marland Kitchen She denies fever, chest pain, orthopnea or hemoptysis.   Test Results: CXR  10/30/2017>> No active cardiopulmonary disease.normal  OV 11/22/2017  Subjective:  Patient ID: Sierra Warner, female , DOB: [birth year] , age MRN: [id number] , ADDRESS: [place] [id number]   11/22/2017 -   Chief Complaint  Patient presents with  . Follow-up    Pt states she has become worse since last visit with MR but about the same since last visit  with SG. Pt states she is still having SOB, occ cough that is worse when up moving around. Denies any CP/chest tightness.     HPI Sierra Warner 84 y.o. -chronic hypoxic respiratory failure on account of Gold stage IV COPD. Presents for routine follow-up. She says that overall she is doing well although she feels she slowly declining and dying. COPD cat score shows continued stability as documented below. She will have a high dose flu shot. She is on Trilogy inhaler and albuterol/duoneb  as needed.she's not interested in getting a blood gas for hypercapnia.She is very interested in continuing with hospice care given the fact it does improve her quality of  life and she feels paradoxically has helped her live longer. When he walked on room air at rest she walked 2 laps and stopped because of dyspnea but never desaturated she just got tachycardic. This pattern be more consistent with deconditioning. There is no associated chest pain.        CAT COPD Symptom & Quality of Life Score (GSK trademark) 0 is no burden. 5 is highest burden 02/21/2017  05/08/2017  11/22/2017   Never Cough -> Cough all the time 3 4 2   No phlegm in chest -> Chest is full of phlegm 3 3 3   No chest tightness -> Chest feels very tight 2 2 1   No dyspnea for 1 flight stairs/hill -> Very dyspneic for 1 flight of stairs 5 5 5   No limitations for ADL at home -> Very limited with ADL at home 3 3 3   Confident leaving home -> Not at all confident leaving home 1 0 3  Sleep soundly -> Do not sleep soundly because of lung condition 3 3 2   Lots of Energy -> No energy at all 4 4 4   TOTAL Score (max 40)  24 24 23      [place] Symptom Assessment Numerical Scale 0 is no problem -> 10 worst problem 02/21/2017    No Pain -> Worst pain 5  No Tiredness -> Worset tiredness 4  No Nausea -> Worst nausea 3  No Depression -> Worst depression 7  No Anxiety -> Worst Anxiety 7  No Drowsiness -> Worst Drowsiness 5  Best appetite-> Worst Appetitle  7  Best Feeling of well being -> Worst feeling 6  No dyspnea-> Worst dyspnea 8  Other problem (none -> severe) Friction rub from o2 tubing face , cheek and neck  Completed by          Simple office walk 185 feet x  3 laps goal with forehead probe 11/22/2017   O2 used Room air x 20 minutes prior to walk  Number laps completed Did only  2 laps  Comments about pace moderate  Resting Pulse Ox/HR 99% and 88/min  Final Pulse Ox/HR 95% and 110/min  Desaturated </= 88% no  Desaturated <= 3% points no  Got Tachycardic >/= 90/min yes  Symptoms at end of test Stopped at 2 laps due to dyspnea  Miscellaneous comments       ROS - per HPI    has a past medical history of Atrophic vaginitis, COPD (chronic obstructive pulmonary disease) (HCC), COPD, mild (HCC), Hemorrhoids, Hypertension, Microscopic hematuria, and Osteopenia.   reports that she quit smoking about 10 years ago. Her smoking use included cigarettes. She has a 13.50 pack-year smoking history. She has never used smokeless tobacco.  Past Surgical History:  Procedure Laterality Date  . APPENDECTOMY  1933  . CATARACT SURGERY    . CYSTOSCOPY    . THYROIDECTOMY, PARTIAL    . TONSILLECTOMY      Allergies  Allergen Reactions  . Sulfa Antibiotics Hives and Rash    Immunization History  Administered Date(s) Administered  . Influenza Split 12/14/2011, 12/18/2012  . Influenza Whole 12/20/2010  . Influenza, High Dose Seasonal PF 12/23/2015, 11/23/2016  . Influenza,inj,Quad PF,6+ Mos 11/26/2013, 12/20/2014  . Pneumococcal Conjugate-13 02/26/2013  . Pneumococcal Polysaccharide-23 10/13/2014    Family History  Problem Relation Age of Onset  . Diabetes Mother   . Hypertension Mother   . Heart disease Father   . Hypertension Father   . Heart failure Father  Current Outpatient Medications:  .  ALPRAZolam (XANAX) 0.25 MG tablet, Take 1 tablet (0.25 mg total) by mouth 3 (three) times daily as needed for anxiety., Disp: 12  tablet, Rfl: 0 .  Calcium Carbonate-Vitamin D (CALCIUM + D PO), Take by mouth 2 (two) times daily.  , Disp: , Rfl:  .  cholecalciferol (VITAMIN D) 1000 UNITS tablet, Take 1,000 Units by mouth daily., Disp: , Rfl:  .  COMBIVENT RESPIMAT 20-100 MCG/ACT AERS respimat, Inhale 1 puff into the lungs every 6 (six) hours as needed for wheezing or shortness of breath., Disp: 1 Inhaler, Rfl: 5 .  furosemide (LASIX) 20 MG tablet, TAKE 1/2 TABLET BY MOUTH DAILY, Disp: 15 tablet, Rfl: 0 .  ipratropium-albuterol (DUONEB) 0.5-2.5 (3) MG/3ML SOLN, USE 1 VIAL VIA NEBULIZER FOUR TIMES DAILY AS NEEDED, Disp: 360 mL, Rfl: 0 .  losartan (COZAAR) 100 MG tablet, TAKE 1 TABLET BY MOUTH DAILY, Disp: 30 tablet, Rfl: 3 .  Multiple Vitamins-Minerals (CENTRUM SILVER 50+WOMEN) TABS, Take 1 tablet by mouth., Disp: , Rfl:  .  Polyethylene Glycol 3350 (MIRALAX PO), Take 1 packet by mouth daily. , Disp: , Rfl:  .  TRELEGY ELLIPTA 100-62.5-25 MCG/INH AEPB, INHALE 1 PUFF INTO THE LUNGS EVERY DAY., Disp: 60 each, Rfl: 5 .  HYDROMET 5-1.5 MG/5ML syrup, Take 5 mLs by mouth every 4 (four) hours as needed for cough. (Patient not taking: Reported on 11/22/2017), Disp: 120 mL, Rfl: 0      Objective:   Vitals:   11/22/17 1142  BP: 114/64  Pulse: 87  SpO2: 95%  Weight: 122 lb 9.6 oz (55.6 kg)  Height: 5\' 1"  (1.549 m)    Estimated body mass index is 23.17 kg/m as calculated from the following:   Height as of this encounter: 5\' 1"  (1.549 m).   Weight as of this encounter: 122 lb 9.6 oz (55.6 kg).  @WEIGHTCHANGE @  [employer]   11/22/17 1142  Weight: 122 lb 9.6 oz (55.6 kg)     Physical Exam  General Appearance:    Alert, cooperative, no distress, appears stated age - yes , sitting on - chairt  Head:    Normocephalic, without obvious abnormality, atraumatic  Eyes:    PERRL, conjunctiva/corneas clear,  Ears:    Normal TM's and external ear canals, both ears  Nose:   Nares normal, septum midline, mucosa normal, no drainage     or sinus tenderness. OXYGEN ON - NO*  Throat:   Lips, mucosa, and tongue normal; teeth and gums normal. Cyanosis on lips - no  Neck:   Supple, symmetrical, trachea midline, no adenopathy;    thyroid:  no enlargement/tenderness/nodules; no carotid   bruit or JVD  Back:     Symmetric, no curvature, ROM normal, no CVA tenderness  Lungs:     Distress - no , Wheeze mild right upper lobe , Barrell Chest - yes, Purse lip breathing - mild +, Crackles - no   Chest Wall:    No tenderness or deformity.    Heart:    Regular rate and rhythm, S1 and S2 normal, no murmur, rub   or gallop  Breast Exam:    NOT DONE  Abdomen:     Soft, non-tender, bowel sounds active all four quadrants,    no masses, no organomegaly  Genitalia:   NOT DONE  Rectal:   NOT DONE  Extremities:   Extremities normal, atraumatic, Clubbing - no, Edema - no  Pulses:   2+ and symmetric all  extremities  Skin:   Stigmata of Connective Tissue Disease - no  Lymph nodes:   Cervical, supraclavicular, and axillary nodes normal  Psychiatric:  Neurologic:   pleasant CNII-XII intact, normal strength, sensation  throughout           Assessment:       ICD-10-CM   1. Stage 4 very severe COPD by GOLD classification ([place]) J44.9   2. Chronic respiratory failure with hypoxia (HCC) J96.11        Plan:      Stable disease  Plan High dose flu shot 11/22/2017 Continue trelegy scheduld + neb prn Discussed abg for high co2 eval - respect deferral Continue hospice but do not be surprised if you become temporarily hospice ineligible  Followup 3 months or sooner if needed     SIGNATURE    Dr. Brand Males, M.D., F.C.C.P,  Pulmonary and Critical Care Medicine Staff Physician, [place] Director - Interstitial Lung Disease  Program  Pulmonary [place] [facility] [place], [place], [address]  Pager: [phone], If no answer or between  15:00h - 7:00h: call [phone] Telephone: [phone]  12:21 PM 11/22/2017

## 2018-01-12 ENCOUNTER — Telehealth: Payer: Self-pay | Admitting: Internal Medicine

## 2018-01-12 NOTE — Telephone Encounter (Signed)
Called patient, requesting Trelegy sample. Informed patient it would be placed upfront. Patient is requesting that we give her more than 1 box, informed patient that is all we had. She is requesting that MR suggest another inhaler so that she can keep asking for samples until the new year. It was explained to patient that we cannot give her samples every time she calls, but I would place a patient assistance application upfront with her sample as well. It was explained that if we cannot get her approved for patient assistance then we would reassess then. Voiced understanding. Nothing further is needed at this time.

## 2018-02-07 ENCOUNTER — Telehealth: Payer: Self-pay | Admitting: Internal Medicine

## 2018-02-07 NOTE — Telephone Encounter (Signed)
Called Hospice RN Ria Comment who stated pt has nasal congestion every night and has been using saline nasal spray but has become worse lately. Per [place], pt does not have any discoloration in the mucus but does have some pain/pressure in her sinuses.  Pt feels like she might need a different nasal spray but does not feel like she needs abx or steroids at this time.  Pt's symptoms are mainly bothering her at night. She is using benadryl in the mornings.   MR, please advise on recs for pt. Thanks!

## 2018-02-07 NOTE — Telephone Encounter (Signed)
She can try  take generic fluticasone inhaler 2 squirts each nostril daily

## 2018-02-08 MED ORDER — FLUTICASONE PROPIONATE 50 MCG/ACT NA SUSP: 2.0000 | Freq: Every day | NASAL | 2 refills | Status: DC

## 2018-02-08 NOTE — Telephone Encounter (Signed)
Spoke with Ria Comment with Hospice. She is aware of MR's response. Rx has been sent in. Nothing further was needed.

## 2018-02-09 ENCOUNTER — Other Ambulatory Visit: Payer: Self-pay | Admitting: Internal Medicine

## 2018-02-09 NOTE — Telephone Encounter (Signed)
MR, please advise if it is okay to refill pt's losartan or if pcp should handle this refill request. Thanks!

## 2018-02-21 ENCOUNTER — Encounter: Payer: Self-pay | Admitting: Internal Medicine

## 2018-02-21 ENCOUNTER — Ambulatory Visit (INDEPENDENT_AMBULATORY_CARE_PROVIDER_SITE_OTHER): Payer: Medicare Other | Admitting: Internal Medicine

## 2018-02-21 VITALS — BP 130/80 | HR 87

## 2018-02-21 DIAGNOSIS — J9611 Chronic respiratory failure with hypoxia: Secondary | ICD-10-CM | POA: Diagnosis not present

## 2018-02-21 DIAGNOSIS — J449 Chronic obstructive pulmonary disease, unspecified: Secondary | ICD-10-CM | POA: Diagnosis not present

## 2018-02-21 NOTE — Progress Notes (Signed)
OV 08/23/2016  Chief Complaint  Patient presents with  . Follow-up    Pt states her breathing is unchanged since last OV. Pt c/o cough with little mucus production. Pt denies CP/tightness and f/c/s.   84 year old female Gold stage IV COPD. She is now under the care of hospice which is paradoxically of the functional status. She is driving. She is on oxygen. She is on nebulizers. Her new complaint is that she's got bilateral pedal edema worse in the daytime as the day progresses. Better with night and at rest. She is unaware she has bilateral varicose veins. Initially she was reluctant to take hospice a few months ago but now she says she's loving it because of the extra layer of supportive provided. She is now worried that she will live past 6 months expectancy and she will "fired" from the hospice. Nevertheless have told her that with her advanced COPD the prognosis is less than 6 months   OV 11/23/2016  Chief Complaint  Patient presents with  . Follow-up    Pt states her breathing is unchanged since last OV. Pt c/o mild prod cough with little mucus production - white in color. Pt denies CP/tightness and f/c/s.    Advanced COPD on home hospice  She feels hospice is working really well for her and has improved her functional status. She is worried that her hospice services might be terminated because of her good functional status. She is on nebulizers. She prefers inhalers and is asking for samples. She says the only thing frustrating about hospice is that just be on nebulizers. Nevertheless overall program has helped her. She will have flu shot today. Walk walking desaturation test on an 185 feet 3 laps on room air: She only did 2 laps and stopped because of dyspnea but more so leg pain. She started off at a pulse rate of 91/m final heart rate was similar. Resting pulse ox was 91%. Final pulse ox was 88% at end of second lap   OV 02/21/2017  Chief Complaint  Patient presents with    . Follow-up    Pt states that she has her good days and bad days. Has complaints of SOB with exertion, occ. cough that can range with mucus of white to yellow mucus. Denies any CP.    Stage IV COPD patient with exertional hypoxemia on hospice care  Overall COPD stable.  She has not had any exacerbation.  She has multiple systemic complaints all documented below.  Particularly she is having some friction rub in her cheek and her left neck from the oxygen tubing.  She plans to see dermatology for this.  She applies expensive cosmetic fashion oriented face creams.  Such as Alvino Blood and Shelly Flatten.  However she does not apply any petroleum jelly to the area of friction rub.  Smoking continues to be under remission.  She is up-to-date with her flu shot.  She enjoys the hospice services and this has helped to improve her functional status.  She is able to drive.  However she does not want to give up hospice services.  Explained the concept of hospice and recertification.  She seems more amenable to lose hospice services if they deem so.  She will be open to palliative support.  But for now she wants to continue hospice service.  She is also complaining that the trilogy  inhalers   expensive because of donut hole and she wants samples.   OV  05/08/2017  Chief Complaint  Patient presents with  . Follow-up    Pt states her breathing has become worse and is becoming SOB more often. Pt also has complaints of productive cough. Denies any CP. Pt was prescribed prednisone and abx to help with copd exacerbation.    84 year old female on hospice care for end-stage COPD but not on oxygen.  She is here with the hospice volunteer.  She tells me overall she is stable although she is got significant amount of fatigue.  She is really bothered by the fatigue.  She says that the hospice care is the only thing keeping her alive.  She does not want to get rid of hospice care.  She is compliant with her medications for  COPD.  She wants a fatigue workup.  Her last labs was with primary care physician in 2017.  She does not have a primary care physician right now.   10/30/2017  Follow up for COPD exacerbation. She was seen by Lazaro Arms NP 10/16/2017. She had been previously treated with Keflex and prednisone for a COPD exacerbation by Dr. [name]. She has completed the medications. D-Dimer was negative. Her maintenance medication is  Trelegy.She is in the donut hole, and is requesting samples. . She states she is using the Mucinex twice daily and she is taking the reflux medication. She does not think they help her breathing . She has been trying to use the nebs twice daily. She states they are too much work. Take too long to prepare and administer, and clean up of her nebs is time consuming.. She states she is experiencing worsening shortness of breath with exertion. She thinks the heat may be an element of her problem. She states her oxygen saturations rarely drop below 90%. She is using her portable oxygen as needed for shortness of breath and at night.  She states her portable oxygen is not working properly. She has ordered the replacement parts.She is using the Trelegy and she states she cannot tell any difference in her breathing. She states she did prefer the combivent.She feels the prednisone taper she took did not agree with her. She states she is at her baseline for her end stage COPD. Marland Kitchen She denies fever, chest pain, orthopnea or hemoptysis.   Test Results: CXR  10/30/2017>> No active cardiopulmonary disease.normal  OV 11/22/2017  Subjective:  Patient ID: Sierra Warner, female , DOB: 11-May-1928 , age MRN: [id number] , ADDRESS: [place] [id number]   11/22/2017 -   Chief Complaint  Patient presents with  . Follow-up    Pt states she has become worse since last visit with MR but about the same since last visit with SG. Pt states she is still having SOB, occ cough that is worse when up  moving around. Denies any CP/chest tightness.     HPI Sierra Warner 84 y.o. -chronic hypoxic respiratory failure on account of Gold stage IV COPD. Presents for routine follow-up. She says that overall she is doing well although she feels she slowly declining and dying. COPD cat score shows continued stability as documented below. She will have a high dose flu shot. She is on Trilogy inhaler and albuterol/duoneb  as needed.she's not interested in getting a blood gas for hypercapnia.She is very interested in continuing with hospice care given the fact it does improve her quality of life and she feels paradoxically has helped her live longer. When he walked on room air at rest she walked  2 laps and stopped because of dyspnea but never desaturated she just got tachycardic. This pattern be more consistent with deconditioning. There is no associated chest pain.        CAT COPD Symptom & Quality of Life Score (GSK trademark) 0 is no burden. 5 is highest burden 02/21/2017  05/08/2017  11/22/2017   Never Cough -> Cough all the time 3 4 2   No phlegm in chest -> Chest is full of phlegm 3 3 3   No chest tightness -> Chest feels very tight 2 2 1   No dyspnea for 1 flight stairs/hill -> Very dyspneic for 1 flight of stairs 5 5 5   No limitations for ADL at home -> Very limited with ADL at home 3 3 3   Confident leaving home -> Not at all confident leaving home 1 0 3  Sleep soundly -> Do not sleep soundly because of lung condition 3 3 2   Lots of Energy -> No energy at all 4 4 4   TOTAL Score (max 40)  24 24 23      [place] Symptom Assessment Numerical Scale 0 is no problem -> 10 worst problem 02/21/2017    No Pain -> Worst pain 5  No Tiredness -> Worset tiredness 4  No Nausea -> Worst nausea 3  No Depression -> Worst depression 7  No Anxiety -> Worst Anxiety 7  No Drowsiness -> Worst Drowsiness 5  Best appetite-> Worst Appetitle 7  Best Feeling of well being -> Worst feeling 6  No dyspnea-> Worst  dyspnea 8  Other problem (none -> severe) Friction rub from o2 tubing face , cheek and neck  Completed by          Simple office walk 185 feet x  3 laps goal with forehead probe 11/22/2017   O2 used Room air x 20 minutes prior to walk  Number laps completed Did only  2 laps  Comments about pace moderate  Resting Pulse Ox/HR 99% and 88/min  Final Pulse Ox/HR 95% and 110/min  Desaturated </= 88% no  Desaturated <= 3% points no  Got Tachycardic >/= 90/min yes  Symptoms at end of test Stopped at 2 laps due to dyspnea  Miscellaneous comments     OV 02/21/2018  Subjective:  Patient ID: Sierra Warner, female , DOB: 06/17/1928 , age 82 y.o. , MRN: [id number] , ADDRESS: [place] [phone]   02/21/2018 -   Chief Complaint  Patient presents with  . Follow-up    COPD: Pt states her breathing is about the same-SOB and wheezing. DME: [facility] for O2.      HPI Sierra Warner 84 y.o. -Gold stage IV advanced COPD patient on hospice care.  Presents for routine follow-up.  She continues on oxygen and Trelegy.  There are no new issues.  Overall she feels stable.  She feels she is alive because of hospice.  She is up-to-date with her pulmonary vaccines.  COPD CAT score is around 17-18 and is within her baseline limits.     ROS - per HPI     has a past medical history of Atrophic vaginitis, COPD (chronic obstructive pulmonary disease) (HCC), COPD, mild ([facility]), Hemorrhoids, Hypertension, Microscopic hematuria, and Osteopenia.   reports that she quit smoking about 10 years ago. Her smoking use included cigarettes. She has a 13.50 pack-year smoking history. She has never used smokeless tobacco.  Past Surgical History:  Procedure Laterality Date  . APPENDECTOMY  1933  . CATARACT SURGERY    .  CYSTOSCOPY    . THYROIDECTOMY, PARTIAL    . TONSILLECTOMY      Allergies  Allergen Reactions  . Sulfa Antibiotics Hives and Rash    Immunization History  Administered Date(s)  Administered  . Influenza Split 12/14/2011, 12/18/2012  . Influenza Whole 12/20/2010  . Influenza, High Dose Seasonal PF 12/23/2015, 11/23/2016, 11/22/2017  . Influenza,inj,Quad PF,6+ Mos 11/26/2013, 12/20/2014  . Pneumococcal Conjugate-13 02/26/2013  . Pneumococcal Polysaccharide-23 10/13/2014    Family History  Problem Relation Age of Onset  . Diabetes Mother   . Hypertension Mother   . Heart disease Father   . Hypertension Father   . Heart failure Father      Current Outpatient Medications:  .  ALPRAZolam (XANAX) 0.25 MG tablet, Take 1 tablet (0.25 mg total) by mouth 3 (three) times daily as needed for anxiety., Disp: 12 tablet, Rfl: 0 .  Calcium Carbonate-Vitamin D (CALCIUM + D PO), Take by mouth 2 (two) times daily.  , Disp: , Rfl:  .  cholecalciferol (VITAMIN D) 1000 UNITS tablet, Take 1,000 Units by mouth daily., Disp: , Rfl:  .  COMBIVENT RESPIMAT 20-100 MCG/ACT AERS respimat, Inhale 1 puff into the lungs every 6 (six) hours as needed for wheezing or shortness of breath., Disp: 1 Inhaler, Rfl: 5 .  fluticasone (FLONASE) 50 MCG/ACT nasal spray, Place 2 sprays into both nostrils daily., Disp: 16 g, Rfl: 2 .  furosemide (LASIX) 20 MG tablet, TAKE 1/2 TABLET BY MOUTH DAILY, Disp: 15 tablet, Rfl: 0 .  HYDROMET 5-1.5 MG/5ML syrup, Take 5 mLs by mouth every 4 (four) hours as needed for cough., Disp: 120 mL, Rfl: 0 .  ipratropium-albuterol (DUONEB) 0.5-2.5 (3) MG/3ML SOLN, USE 1 VIAL VIA NEBULIZER FOUR TIMES DAILY AS NEEDED, Disp: 360 mL, Rfl: 0 .  losartan (COZAAR) 100 MG tablet, TAKE 1 TABLET BY MOUTH DAILY, Disp: 30 tablet, Rfl: 3 .  Multiple Vitamins-Minerals (CENTRUM SILVER 50+WOMEN) TABS, Take 1 tablet by mouth., Disp: , Rfl:  .  Polyethylene Glycol 3350 (MIRALAX PO), Take 1 packet by mouth daily. , Disp: , Rfl:  .  TRELEGY ELLIPTA 100-62.5-25 MCG/INH AEPB, INHALE 1 PUFF INTO THE LUNGS EVERY DAY., Disp: 60 each, Rfl: 5      Objective:   Vitals:   02/21/18 1248  BP: 130/80   Pulse: 87  SpO2: 96%    Estimated body mass index is 23.17 kg/m as calculated from the following:   Height as of 11/22/17: 5\' 1"  (1.549 m).   Weight as of 11/22/17: 122 lb 9.6 oz (55.6 kg).  @WEIGHTCHANGE @  There were no vitals filed for this visit.   Physical Exam  General Appearance:    Alert, cooperative, no distress, appears stated age - yes , Deconditioned looking - no , OBESE  - no, Sitting on Wheelchair -  no  Head:    Normocephalic, without obvious abnormality, atraumatic  Eyes:    PERRL, conjunctiva/corneas clear,  Ears:    Normal TM's and external ear canals, both ears  Nose:   Nares normal, septum midline, mucosa normal, no drainage    or sinus tenderness. OXYGEN ON  - yes . Patient is @ 2L   Throat:   Lips, mucosa, and tongue normal; teeth and gums normal. Cyanosis on lips - no  Neck:   Supple, symmetrical, trachea midline, no adenopathy;    thyroid:  no enlargement/tenderness/nodules; no carotid   bruit or JVD  Back:     Symmetric, no curvature, ROM normal, no CVA  tenderness  Lungs:     Distress - no , Wheeze no, Barrell Chest - yes, Purse lip breathing - y, Crackles - no   Chest Wall:    No tenderness or deformity.    Heart:    Regular rate and rhythm, S1 and S2 normal, no rub   or gallop, Murmur - no  Breast Exam:    NOT DONE  Abdomen:     Soft, non-tender, bowel sounds active all four quadrants,    no masses, no organomegaly. Visceral obesity - no  Genitalia:   NOT DONE  Rectal:   NOT DONE  Extremities:   Extremities - normal, Has Cane - no, Clubbing - no, Edema - no  Pulses:   2+ and symmetric all extremities  Skin:   Stigmata of Connective Tissue Disease - no  Lymph nodes:   Cervical, supraclavicular, and axillary nodes normal  Psychiatric:  Neurologic:   Pleasant - yes, Anxious - no, Flat affect - no  CAm-ICU - neg, Alert and Oriented x 3 - yes, Moves all 4s - yes, Speech - normal, Cognition - intact           Assessment:       ICD-10-CM   1. Stage  4 very severe COPD by GOLD classification ([facility]) J44.9   2. Chronic respiratory failure with hypoxia (HCC) J96.11        Plan:     Patient Instructions     ICD-10-CM   1. Stage 4 very severe COPD by GOLD classification ([facility]) J44.9   2. Chronic respiratory failure with hypoxia (HCC) J96.11     Stable COPD   Plan Continue trelegy scheduled and o2 Please talk to PCP Patient, No Pcp Per -  and ensure you get  shingrix (Divide) inactivated vaccine against shingles Continue hospice support  Followup 6 months or sooner if needed     SIGNATURE    Dr. Brand Males, M.D., F.C.C.P,  Pulmonary and Critical Care Medicine Staff Physician, [employer] Director - Interstitial Lung Disease  Program  Pulmonary [place] [facility] [place], [place], [address]  Pager: [phone], If no answer or between  15:00h - 7:00h: call [phone] Telephone: [phone]  1:07 PM 02/21/2018

## 2018-02-21 NOTE — Patient Instructions (Signed)
ICD-10-CM   1. Stage 4 very severe COPD by GOLD classification ([facility]) J44.9   2. Chronic respiratory failure with hypoxia (HCC) J96.11     Stable COPD   Plan Continue trelegy scheduled and o2 Please talk to PCP Patient, No Pcp Per -  and ensure you get  shingrix (GSK) inactivated vaccine against shingles Continue hospice support  Followup 6 months or sooner if needed

## 2018-05-12 ENCOUNTER — Other Ambulatory Visit: Payer: Self-pay | Admitting: Internal Medicine

## 2018-05-14 ENCOUNTER — Telehealth: Payer: Self-pay | Admitting: Internal Medicine

## 2018-05-14 MED ORDER — DOXYCYCLINE HYCLATE 100 MG PO TABS: 100.0000 mg | ORAL_TABLET | Freq: Two times a day (BID) | ORAL | 0 refills | Status: DC

## 2018-05-14 NOTE — Telephone Encounter (Signed)
Can offer:   Doxycycline >>> 1 100 mg tablet every 12 hours for 7 days >>>take with food  >>>wear sunscreen    Please place the order.   Follow up with PCP if symptoms worsen.   Wyn Quaker FNP

## 2018-05-14 NOTE — Telephone Encounter (Signed)
Called and spoke with Seth Bake, Hospice.  Patient is seen by Dr [name], last OV was 02/21/18. She stated that the Patient had hit her leg a few weeks ago.  She stated that they have been checking it, because there was a skin tear. She stated that it has gotten red, tender to touch. She stated that she cleaned the area this morning, and applied antibiotic ointment.  She stated that it looked like cellulitis. She is requesting a antibiotic.  Message routed to Wyn Quaker, NP

## 2018-05-14 NOTE — Telephone Encounter (Signed)
Called and spoke with Seth Bake, Hospice.  Wyn Quaker, NP, recommendations given.  Understanding stated.  Doxycycline 100mg , take every 12 hours, #14, sent to requested pharmacy, [place], [facility].  Nothing further at this time.

## 2018-05-22 ENCOUNTER — Telehealth: Payer: Self-pay | Admitting: Internal Medicine

## 2018-05-22 DIAGNOSIS — M79606 Pain in leg, unspecified: Secondary | ICD-10-CM

## 2018-05-22 NOTE — Telephone Encounter (Signed)
Called and spoke with [place], Hospice.  She stated that Patient hit her leg 6 weeks ago on the car door.  Hospice nurse Seth Bake called 05/14/18, for recommendations, because they were afraid of infection with a skin tear. Patient was prescribed doxycycline.  Doxycycline was finished yesterday.  Patient is still complaining of tender area, between ankle and mid calf.  She does have a blood blister in the middle, that hospice is keeping covered.  Ria Comment stated that patient still has swelling, redness, with no improvement.  Ria Comment stated Dr Lyman Speller went out to see Patient, and he wanted Dr Golden Pop recommendations for a PCP.  Patient's PCP left town 2 years ago, so she is not follow by nobody, but MR and Dr Lyman Speller.    Dr [name], please advise on PCP recommendations

## 2018-05-22 NOTE — Telephone Encounter (Signed)
PCP - [facility] or any of the [facility] practices closer to her home [place] [address]

## 2018-05-23 NOTE — Telephone Encounter (Signed)
Called and spoke with [facility] will work for the patient. Will send in referral. Nothing further needed.

## 2018-05-24 ENCOUNTER — Telehealth: Payer: Self-pay | Admitting: Internal Medicine

## 2018-05-24 DIAGNOSIS — L089 Local infection of the skin and subcutaneous tissue, unspecified: Secondary | ICD-10-CM

## 2018-05-24 DIAGNOSIS — T148XXA Other injury of unspecified body region, initial encounter: Principal | ICD-10-CM

## 2018-05-24 NOTE — Telephone Encounter (Signed)
Spoke with Ria Comment and she states she has a wound o her leg and it is getting worse very quickly. She finished her doxycycline and but the nurse still thinks someone needs to look at it. MR can we place a referral to the wound center for wound care? Please advise.

## 2018-05-24 NOTE — Telephone Encounter (Signed)
MR - please advise on plan of care For this pt or Call MD Tammy at [phone].

## 2018-05-25 NOTE — Telephone Encounter (Signed)
TP could you advise? I think the nurse is worried about her leg getting infected or getting worse and I dont think MR is here.

## 2018-05-25 NOTE — Telephone Encounter (Signed)
Ria Comment calling to f/u on referral CB# [phone]

## 2018-05-25 NOTE — Telephone Encounter (Signed)
I attempted to schedule the patient to be seen at the [facility].  However, when I spoke with Destiny, she advised that they don't see hospice patients.

## 2018-05-25 NOTE — Telephone Encounter (Signed)
AGre wound has to be addressed by PCP Patient, No Pcp Per or wound care center.

## 2018-05-25 NOTE — Telephone Encounter (Signed)
I called Sierra Warner but there was no answer. Advised her to call back.

## 2018-05-25 NOTE — Telephone Encounter (Signed)
Per TP: okay for referral to wound care center.  Referral placed Called spoke with [facility] with Hospice, she is aware of pending referral Will sign and forward to MR to make him aware

## 2018-05-25 NOTE — Telephone Encounter (Signed)
Lloyd, she is a Marketing executive for primary care. I was placed on hold for over 7 minutes will call back.

## 2018-05-25 NOTE — Telephone Encounter (Signed)
I attempted to schedule the patient to be seen at the [facility].  However, when I spoke with Destiny, she advised that they don't see hospice patients.

## 2018-05-28 NOTE — Telephone Encounter (Signed)
I called Tammy, but no answer. LM for her to call back.

## 2018-05-28 NOTE — Telephone Encounter (Signed)
tammy calling back to see if patient needs primary care referral still for wound of leg. Previous note states wound center does not take hospice patients.   However, Tammy from primary care wants our office to call patient to make sure she is aware of why the referral is being placed. AND if primary care still needs to make this appointment for patient. You can call Tammy at [phone]

## 2018-05-28 NOTE — Telephone Encounter (Signed)
Called Tammy at [facility]. No one answered. Left message on machine to give [place] a call back.

## 2018-05-29 ENCOUNTER — Telehealth: Payer: Self-pay | Admitting: Internal Medicine

## 2018-05-29 NOTE — Telephone Encounter (Signed)
Called and spoke with Tammy who stated she spoke with pt in regards to scheduling pt for the wound on her leg and stated she had tried to get pt scheduled for an OV at either [facility] or [facility] but pt stated she only needed a one time visit for the wound on her leg.  Tammy wanted to know if a home health company could possibly come to her place and treat her wound instead of getting a primary care for pt as pt did not want to establish with a primary care. Tammy stated that she would try to reach out to pt's hospice nurse to try to get things taken care of for pt and then would call us back once she had any answers.

## 2018-05-29 NOTE — Telephone Encounter (Signed)
I have spoken with Ria Comment the patients Hospice nurse.  Ria Comment states that Hospice does have a protocol for wound care but that the wound is not healing.  States that the patient does not feel comfortable with the fact that the wound is not healing.  States that patient does not need wound vac.  But believes patient would feel better if this was addressed with a wound care specialist.  States patient does not need a PCP.   I have reached out to the wound care center.  They do in fact see patients with Hospice.  However the referral needs to come from Hospice to wound care directly.  I have been able to reach [place].  I have informed her to have hospice refer the patient over to wound care and to follow up with wound care in regard.  Please let me know if there is anything else I can do I my end.   I am going to close the referral for PCP out on my end.   Thanks!

## 2018-05-29 NOTE — Telephone Encounter (Signed)
Called Tammy at [facility] care. Phone went to voicemail. LMTCB

## 2018-05-29 NOTE — Telephone Encounter (Signed)
Saw the message that was sent by Tammy and I have sent the message to MR as an FYI.  Called and spoke with Tammy in regards to this. Nothing further needed.

## 2018-05-29 NOTE — Telephone Encounter (Signed)
Tammy returned phone call.  She is going to get patient an appt. At [facility].  Tammy phone number is [phone].

## 2018-05-29 NOTE — Telephone Encounter (Signed)
Noted. Sending to MR as an FYI.

## 2018-05-29 NOTE — Telephone Encounter (Signed)
Ok thanks 

## 2018-06-06 ENCOUNTER — Other Ambulatory Visit: Payer: Self-pay | Admitting: Internal Medicine

## 2018-06-06 NOTE — Telephone Encounter (Signed)
Refill request received for losartan. MR, please advise if it is okay to refill med for pt. Thanks!

## 2018-06-11 ENCOUNTER — Encounter (HOSPITAL_BASED_OUTPATIENT_CLINIC_OR_DEPARTMENT_OTHER): Payer: BLUE CROSS/BLUE SHIELD

## 2018-06-13 ENCOUNTER — Encounter (HOSPITAL_BASED_OUTPATIENT_CLINIC_OR_DEPARTMENT_OTHER): Attending: Internal Medicine

## 2018-06-13 ENCOUNTER — Encounter (HOSPITAL_BASED_OUTPATIENT_CLINIC_OR_DEPARTMENT_OTHER): Payer: Self-pay

## 2018-06-13 ENCOUNTER — Other Ambulatory Visit (HOSPITAL_COMMUNITY)
Admission: RE | Admit: 2018-06-13 | Discharge: 2018-06-13 | Disposition: A | Discharge status: Home / Self Care | Source: Other Acute Inpatient Hospital | Attending: Internal Medicine | Admitting: Internal Medicine

## 2018-06-13 DIAGNOSIS — I872 Venous insufficiency (chronic) (peripheral): Secondary | ICD-10-CM | POA: Diagnosis not present

## 2018-06-13 DIAGNOSIS — I10 Essential (primary) hypertension: Secondary | ICD-10-CM | POA: Insufficient documentation

## 2018-06-13 DIAGNOSIS — Z9981 Dependence on supplemental oxygen: Secondary | ICD-10-CM | POA: Insufficient documentation

## 2018-06-13 DIAGNOSIS — L97822 Non-pressure chronic ulcer of other part of left lower leg with fat layer exposed: Secondary | ICD-10-CM | POA: Insufficient documentation

## 2018-06-13 DIAGNOSIS — J449 Chronic obstructive pulmonary disease, unspecified: Secondary | ICD-10-CM | POA: Diagnosis not present

## 2018-06-13 DIAGNOSIS — J9611 Chronic respiratory failure with hypoxia: Secondary | ICD-10-CM | POA: Diagnosis not present

## 2018-06-13 DIAGNOSIS — Z87891 Personal history of nicotine dependence: Secondary | ICD-10-CM | POA: Insufficient documentation

## 2018-06-16 LAB — AEROBIC CULTURE W GRAM STAIN (SUPERFICIAL SPECIMEN)

## 2018-06-16 LAB — AEROBIC CULTURE  (SUPERFICIAL SPECIMEN)

## 2018-06-21 ENCOUNTER — Encounter (HOSPITAL_BASED_OUTPATIENT_CLINIC_OR_DEPARTMENT_OTHER): Payer: BLUE CROSS/BLUE SHIELD | Attending: Internal Medicine

## 2018-10-15 ENCOUNTER — Telehealth: Payer: Self-pay | Admitting: Internal Medicine

## 2018-10-15 NOTE — Telephone Encounter (Signed)
Tylenol can be gotten OTC.  Attempted to call [facility] with Hospice but unable to reach. Left message for Benjamine Mola to return call.

## 2018-10-15 NOTE — Telephone Encounter (Signed)
Elizabeth returned call. When asked her who had prescribed pt tylenol prior, she said that it had MR's name listed. I stated to her that MR is the attending for her hospice care but looking at her current med list, we do not have tylenol listed on there unless you go to meds to be reconciled and then at that point, it does not have MR's name on it for provider that prescribed med. I told Benjamine Mola that MR was currently working at the hospital still due to covid but we could check with another provider to see if they would be okay sending Rx to pharmacy for pt knowing that tylenol is avail OTC and she stated to me she would just have the hospice MD send Rx in for pt. Nothing further needed.

## 2018-11-06 ENCOUNTER — Encounter (HOSPITAL_COMMUNITY): Payer: Self-pay | Admitting: Internal Medicine

## 2018-11-06 ENCOUNTER — Other Ambulatory Visit: Payer: Self-pay

## 2018-11-06 ENCOUNTER — Emergency Department (HOSPITAL_COMMUNITY)

## 2018-11-06 ENCOUNTER — Inpatient Hospital Stay (HOSPITAL_COMMUNITY)
Admission: EM | Admit: 2018-11-06 | Discharge: 2018-11-09 | DRG: 191 | Disposition: A | Discharge status: Hospice | Attending: Internal Medicine | Admitting: Internal Medicine

## 2018-11-06 DIAGNOSIS — Z79899 Other long term (current) drug therapy: Secondary | ICD-10-CM

## 2018-11-06 DIAGNOSIS — Z7951 Long term (current) use of inhaled steroids: Secondary | ICD-10-CM

## 2018-11-06 DIAGNOSIS — Z882 Allergy status to sulfonamides status: Secondary | ICD-10-CM

## 2018-11-06 DIAGNOSIS — K4090 Unilateral inguinal hernia, without obstruction or gangrene, not specified as recurrent: Secondary | ICD-10-CM | POA: Diagnosis present

## 2018-11-06 DIAGNOSIS — I10 Essential (primary) hypertension: Secondary | ICD-10-CM | POA: Diagnosis present

## 2018-11-06 DIAGNOSIS — K219 Gastro-esophageal reflux disease without esophagitis: Secondary | ICD-10-CM | POA: Diagnosis present

## 2018-11-06 DIAGNOSIS — R823 Hemoglobinuria: Secondary | ICD-10-CM | POA: Diagnosis present

## 2018-11-06 DIAGNOSIS — M810 Age-related osteoporosis without current pathological fracture: Secondary | ICD-10-CM | POA: Diagnosis present

## 2018-11-06 DIAGNOSIS — Z8249 Family history of ischemic heart disease and other diseases of the circulatory system: Secondary | ICD-10-CM

## 2018-11-06 DIAGNOSIS — M160 Bilateral primary osteoarthritis of hip: Secondary | ICD-10-CM | POA: Diagnosis present

## 2018-11-06 DIAGNOSIS — J441 Chronic obstructive pulmonary disease with (acute) exacerbation: Principal | ICD-10-CM | POA: Diagnosis present

## 2018-11-06 DIAGNOSIS — Z66 Do not resuscitate: Secondary | ICD-10-CM | POA: Diagnosis present

## 2018-11-06 DIAGNOSIS — Z87891 Personal history of nicotine dependence: Secondary | ICD-10-CM

## 2018-11-06 DIAGNOSIS — Z20828 Contact with and (suspected) exposure to other viral communicable diseases: Secondary | ICD-10-CM | POA: Diagnosis present

## 2018-11-06 DIAGNOSIS — R0600 Dyspnea, unspecified: Secondary | ICD-10-CM | POA: Diagnosis not present

## 2018-11-06 DIAGNOSIS — M1612 Unilateral primary osteoarthritis, left hip: Secondary | ICD-10-CM

## 2018-11-06 DIAGNOSIS — J449 Chronic obstructive pulmonary disease, unspecified: Secondary | ICD-10-CM | POA: Diagnosis present

## 2018-11-06 DIAGNOSIS — M79606 Pain in leg, unspecified: Secondary | ICD-10-CM

## 2018-11-06 DIAGNOSIS — J9610 Chronic respiratory failure, unspecified whether with hypoxia or hypercapnia: Secondary | ICD-10-CM | POA: Diagnosis present

## 2018-11-06 DIAGNOSIS — K439 Ventral hernia without obstruction or gangrene: Secondary | ICD-10-CM | POA: Diagnosis present

## 2018-11-06 DIAGNOSIS — M1990 Unspecified osteoarthritis, unspecified site: Secondary | ICD-10-CM | POA: Diagnosis present

## 2018-11-06 DIAGNOSIS — K5900 Constipation, unspecified: Secondary | ICD-10-CM | POA: Diagnosis present

## 2018-11-06 LAB — CBC
HCT: 40.6 % (ref 36.0–46.0)
Hemoglobin: 13.1 g/dL (ref 12.0–15.0)
MCH: 30.8 pg (ref 26.0–34.0)
MCHC: 32.3 g/dL (ref 30.0–36.0)
MCV: 95.5 fL (ref 80.0–100.0)
Platelets: 246 10*3/uL (ref 150–400)
RBC: 4.25 MIL/uL (ref 3.87–5.11)
RDW: 14.2 % (ref 11.5–15.5)
WBC: 9.9 10*3/uL (ref 4.0–10.5)
nRBC: 0 % (ref 0.0–0.2)

## 2018-11-06 LAB — URINALYSIS, ROUTINE W REFLEX MICROSCOPIC
Bacteria, UA: NONE SEEN
Bilirubin Urine: NEGATIVE
Glucose, UA: NEGATIVE mg/dL
Ketones, ur: NEGATIVE mg/dL
Nitrite: NEGATIVE
Protein, ur: NEGATIVE mg/dL
Specific Gravity, Urine: 1.017 (ref 1.005–1.030)
pH: 6 (ref 5.0–8.0)

## 2018-11-06 LAB — PHOSPHORUS: Phosphorus: 4.6 mg/dL (ref 2.5–4.6)

## 2018-11-06 LAB — BASIC METABOLIC PANEL
Anion gap: 11 (ref 5–15)
BUN: 21 mg/dL (ref 8–23)
CO2: 28 mmol/L (ref 22–32)
Calcium: 9.4 mg/dL (ref 8.9–10.3)
Chloride: 98 mmol/L (ref 98–111)
Creatinine, Ser: 0.79 mg/dL (ref 0.44–1.00)
GFR calc Af Amer: >60 mL/min (ref >60)
GFR calc non Af Amer: >60 mL/min (ref >60)
Glucose, Bld: 120 mg/dL — ABNORMAL HIGH (ref 70–99)
Potassium: 4.3 mmol/L (ref 3.5–5.1)
Sodium: 137 mmol/L (ref 135–145)

## 2018-11-06 LAB — MAGNESIUM: Magnesium: 2.4 mg/dL (ref 1.7–2.4)

## 2018-11-06 LAB — SARS CORONAVIRUS 2 BY RT PCR (HOSPITAL ORDER, PERFORMED IN ~~LOC~~ HOSPITAL LAB): SARS Coronavirus 2: NEGATIVE

## 2018-11-06 MED ORDER — METHYLPREDNISOLONE SODIUM SUCC 125 MG IJ SOLR
125.0000 mg | Freq: Once | INTRAMUSCULAR | Status: AC
  Administered 2018-11-06: 125 mg via INTRAVENOUS
  Filled 2018-11-06: qty 2

## 2018-11-06 MED ORDER — METHYLPREDNISOLONE SODIUM SUCC 40 MG IJ SOLR
40.0000 mg | Freq: Two times a day (BID) | INTRAMUSCULAR | Status: AC
  Administered 2018-11-07 (×2): 40 mg via INTRAVENOUS
  Filled 2018-11-06 (×2): qty 1

## 2018-11-06 MED ORDER — MORPHINE SULFATE (PF) 4 MG/ML IV SOLN
4.0000 mg | Freq: Once | INTRAVENOUS | Status: AC
  Administered 2018-11-06: 4 mg via INTRAVENOUS
  Filled 2018-11-06: qty 1

## 2018-11-06 MED ORDER — ACETAMINOPHEN 650 MG RE SUPP: 650.0000 mg | Freq: Four times a day (QID) | RECTAL | Status: DC | PRN

## 2018-11-06 MED ORDER — ACETAMINOPHEN 325 MG PO TABS
650.0000 mg | ORAL_TABLET | Freq: Four times a day (QID) | ORAL | Status: DC | PRN
  Administered 2018-11-07: 650 mg via ORAL
  Filled 2018-11-06: qty 2

## 2018-11-06 MED ORDER — ENOXAPARIN SODIUM 40 MG/0.4ML ~~LOC~~ SOLN: 40.0000 mg | SUBCUTANEOUS | Status: DC

## 2018-11-06 MED ORDER — MAGNESIUM SULFATE 2 GM/50ML IV SOLN
2.0000 g | Freq: Once | INTRAVENOUS | Status: AC
  Administered 2018-11-06: 2 g via INTRAVENOUS
  Filled 2018-11-06: qty 50

## 2018-11-06 MED ORDER — ALBUTEROL SULFATE HFA 108 (90 BASE) MCG/ACT IN AERS
8.0000 | INHALATION_SPRAY | Freq: Once | RESPIRATORY_TRACT | Status: AC
  Administered 2018-11-06: 8 via RESPIRATORY_TRACT
  Filled 2018-11-06: qty 6.7

## 2018-11-06 MED ORDER — PREDNISONE 20 MG PO TABS
40.0000 mg | ORAL_TABLET | Freq: Every day | ORAL | Status: DC
  Administered 2018-11-08 – 2018-11-09 (×2): 40 mg via ORAL
  Filled 2018-11-06 (×2): qty 2

## 2018-11-06 MED ORDER — ENOXAPARIN SODIUM 30 MG/0.3ML ~~LOC~~ SOLN
30.0000 mg | Freq: Every day | SUBCUTANEOUS | Status: DC
  Administered 2018-11-07 – 2018-11-08 (×2): 30 mg via SUBCUTANEOUS
  Filled 2018-11-06 (×2): qty 0.3

## 2018-11-06 NOTE — H&P (Signed)
History and Physical    Sierra Warner:[id number] DOB: 07/24/1928 DOA: 11/06/2018  PCP: Patient, No Pcp Per   Patient coming from: Home.  I have personally briefly reviewed patient's old medical records in [place]  Chief Complaint: Leg pain.  HPI: Sierra Warner is a 85 y.o. female with medical history significant of atrophic vaginitis, hemorrhoids, hypertension, microscopic hematuria, osteopenia end-stage COPD who came into the emergency department due to left lower extremity pain, particularly on the hip and knee, but radiating all the way down to her left foot.  She denies trauma to the area and has been taking azithromycin often with occasional tramadol.  She has been having wheezing and dyspnea.  She denies fever, chills, rhinorrhea, sore throat, hemoptysis.  No chest pain, palpitations, diaphoresis, PND, orthopnea or pitting edema of the lower extremities.  Denies abdominal pain, nausea or emesis, melena or hematochezia.  She gets frequent constipation.  No dysuria, frequency or hematuria.  Denies polyuria, polydipsia, polyphagia or blurred vision.s  ED Course: Initial vital signs temperature 98.2 F, pulse 94, respirations 25, blood pressure 154/68 mmHg and O2 sat 98% on room air.  Later in the evening the patient became tachypneic while in the emergency department.  She received bronchodilators along with supplemental oxygen and IV Solu-Medrol.  Her urinalysis had a hazy appearance, small hemoglobinuria and moderate leukocyte esterase.  CBC was normal.  BMP showed a glucose of 120 mg/dL but was otherwise unremarkable.    Imaging: chest radiograph did not show any active disease.  Hip x-ray showed moderate DJD changes.  CT pelvis no acute on normalities, but once again is show bilateral osteoarthritis.  There was a left lower quadrant hernia containing loops of bowel, but without a complicating feature.  There were small fat-containing inguinal hernias a midline ventral hernias please  see images and full radiology report for further detail.  Review of Systems: As per HPI otherwise 10 point review of systems negative.   Past Medical History:  Diagnosis Date   Atrophic vaginitis    COPD (chronic obstructive pulmonary disease) (HCC)    COPD, mild (HCC)    Hemorrhoids    Hypertension    Microscopic hematuria    Osteopenia     Past Surgical History:  Procedure Laterality Date   APPENDECTOMY  1933   CATARACT SURGERY     CYSTOSCOPY     THYROIDECTOMY, PARTIAL     TONSILLECTOMY       reports that she quit smoking about 11 years ago. Her smoking use included cigarettes. She has a 13.50 pack-year smoking history. She has never used smokeless tobacco. She reports current alcohol use of about 4.0 standard drinks of alcohol per week. She reports that she does not use drugs.  Allergies  Allergen Reactions   Sulfa Antibiotics Hives and Rash    Family History  Problem Relation Age of Onset   Diabetes Mother    Hypertension Mother    Heart disease Father    Hypertension Father    Heart failure Father    Prior to Admission medications   Medication Sig Start Date End Date Taking? Authorizing Provider  ACETAMINOPHEN EXTRA STRENGTH 500 MG tablet TK 2 TS PO Q 6 H PRF  MILD PAIN 10/15/18  Yes [provider]  ALPRAZolam (XANAX) 0.25 MG tablet Take 1 tablet (0.25 mg total) by mouth 3 (three) times daily as needed for anxiety. 06/18/16  Yes Rosita Fire, MD  albuterol (VENTOLIN HFA) 108 (90 Base)  MCG/ACT inhaler Inhale 2 puffs into the lungs every 6 (six) hours as needed for wheezing or shortness of breath.  10/26/18   [provider]  Calcium Carbonate-Vitamin D (CALCIUM + D PO) Take by mouth 2 (two) times daily.      [provider]  cholecalciferol (VITAMIN D) 1000 UNITS tablet Take 1,000 Units by mouth daily.    [provider]  COMBIVENT RESPIMAT 20-100 MCG/ACT AERS respimat INHALE 1 PUFF INTO THE LUNGS EVERY 6  HOURS AS NEEDED FOR WHEEZING OR SHORTNESS OF BREATH 05/14/18   Brand Males, MD  doxycycline (VIBRA-TABS) 100 MG tablet Take 1 tablet (100 mg total) by mouth 2 (two) times daily. 05/14/18   Lauraine Rinne, NP  fluticasone (FLONASE) 50 MCG/ACT nasal spray Place 2 sprays into both nostrils daily. 02/08/18   Brand Males, MD  furosemide (LASIX) 20 MG tablet TAKE 1/2 TABLET BY MOUTH DAILY 08/23/17   Brand Males, MD  HYDROMET 5-1.5 MG/5ML syrup Take 5 mLs by mouth every 4 (four) hours as needed for cough. 09/18/17   Baird Lyons D, MD  hydrOXYzine (ATARAX/VISTARIL) 10 MG tablet TK 1 T PO Q 4 H PRF ITCHING 06/26/18   [provider]  ipratropium-albuterol (DUONEB) 0.5-2.5 (3) MG/3ML SOLN USE 1 VIAL VIA NEBULIZER FOUR TIMES DAILY AS NEEDED 11/15/17   Brand Males, MD  losartan (COZAAR) 100 MG tablet TAKE 1 TABLET BY MOUTH DAILY 11/15/17   Brand Males, MD  MUCINEX 600 MG 12 hr tablet  11/01/18   [provider]  Multiple Vitamins-Minerals (CENTRUM SILVER 50+WOMEN) TABS Take 1 tablet by mouth.    [provider]  Polyethylene Glycol 3350 (MIRALAX PO) Take 1 packet by mouth daily.     [provider]  polyethylene glycol powder (GLYCOLAX/MIRALAX) 17 GM/SCOOP powder DIS 17 GRAMS IN LQ AND DRK PO D 10/11/18   [provider]  temazepam (RESTORIL) 15 MG capsule  09/12/18   [provider]  traMADol (ULTRAM) 50 MG tablet TK 1/2 T PO  Q 8 H PRN P 11/04/18   [provider]  TRELEGY ELLIPTA 100-62.5-25 MCG/INH AEPB INHALE 1 PUFF INTO THE LUNGS EVERY DAY. 07/28/17   Brand Males, MD    Physical Exam: Vitals:   11/06/18 1750 11/06/18 2000 11/06/18 2100  BP: (!) 154/68 (!) 147/79 (!) 146/75  Pulse: 94 (!) 111 (!) 107  Resp: (!) 25 (!) 32 (!) 28  Temp: 98.2 F (36.8 C)    TempSrc: Oral    SpO2: 98% 96% 91%    Constitutional: Frail, elderly female, in NAD, calm, comfortable Eyes: PERRL, lids and conjunctivae normal ENMT:  Mucous membranes are moist. Posterior pharynx clear of any exudate or lesions.  Neck: normal, supple, no masses, no thyromegaly Respiratory: Decreased breath sounds with wheezing and mild rhonchi bilaterally. Normal respiratory effort. No accessory muscle use.  Cardiovascular: Regular rate and rhythm, no murmurs / rubs / gallops. No extremity edema. 2+ pedal pulses. No carotid bruits.  Abdomen: Positive umbilical hernia.  Soft, no tenderness, no masses palpated. No hepatosplenomegaly. Bowel sounds positive.  Musculoskeletal: no clubbing / cyanosis.  Lower extremities decreased ROM, no contractures. Normal muscle tone.  Skin: Areas of ecchymosis on extremities. Neurologic: CN 2-12 grossly intact. Sensation intact, DTR normal. Strength 5/5 in all 4.  Psychiatric: Normal judgment and insight. Alert and oriented x 3. Normal mood.   Labs on Admission: I have personally reviewed following labs and imaging studies  CBC: Recent Labs  Lab 11/06/18 1816  WBC 9.9  HGB 13.1  HCT 40.6  MCV 95.5  PLT 678   Basic Metabolic Panel: Recent Labs  Lab 11/06/18 1816  NA 137  K 4.3  CL 98  CO2 28  GLUCOSE 120*  BUN 21  CREATININE 0.79  CALCIUM 9.4  MG 2.4  PHOS 4.6   GFR: CrCl cannot be calculated (Unknown ideal weight.). Liver Function Tests: No results for input(s): AST, ALT, ALKPHOS, BILITOT, PROT, ALBUMIN in the last 168 hours. No results for input(s): LIPASE, AMYLASE in the last 168 hours. No results for input(s): AMMONIA in the last 168 hours. Coagulation Profile: No results for input(s): INR, PROTIME in the last 168 hours. Cardiac Enzymes: No results for input(s): CKTOTAL, CKMB, CKMBINDEX, TROPONINI in the last 168 hours. BNP (last 3 results) No results for input(s): PROBNP in the last 8760 hours. HbA1C: No results for input(s): HGBA1C in the last 72 hours. CBG: No results for input(s): GLUCAP in the last 168 hours. Lipid Profile: No results for input(s): CHOL, HDL, LDLCALC,  TRIG, CHOLHDL, LDLDIRECT in the last 72 hours. Thyroid Function Tests: No results for input(s): TSH, T4TOTAL, FREET4, T3FREE, THYROIDAB in the last 72 hours. Anemia Panel: No results for input(s): VITAMINB12, FOLATE, FERRITIN, TIBC, IRON, RETICCTPCT in the last 72 hours. Urine analysis:    Component Value Date/Time   COLORURINE STRAW (A) 06/08/2016 0616   APPEARANCEUR CLEAR 06/08/2016 0616   LABSPEC 1.006 06/08/2016 0616   PHURINE 6.0 06/08/2016 0616   GLUCOSEU NEGATIVE 06/08/2016 0616   HGBUR SMALL (A) 06/08/2016 0616   BILIRUBINUR NEGATIVE 06/08/2016 0616   KETONESUR NEGATIVE 06/08/2016 0616   PROTEINUR NEGATIVE 06/08/2016 0616   UROBILINOGEN 0.2 12/20/2013 2253   NITRITE NEGATIVE 06/08/2016 0616   LEUKOCYTESUR NEGATIVE 06/08/2016 0616    Radiological Exams on Admission: Ct Lumbar Spine Wo Contrast  Result Date: 11/06/2018 CLINICAL DATA:  Initial evaluation for left lower extremity pain for 2 weeks. EXAM: CT LUMBAR SPINE WITHOUT CONTRAST TECHNIQUE: Multidetector CT imaging of the lumbar spine was performed without intravenous contrast administration. Multiplanar CT image reconstructions were also generated. COMPARISON:  None available. FINDINGS: Segmentation: Transitional lumbosacral anatomy. Lowest rib-bearing vertebral body labeled T12, and there is sacralization of the L5 vertebral body. L5-S1 disc is vestigial and rudimentary. Alignment: Trace dextroscoliosis with straightening of the normal lumbar lordosis. No listhesis or malalignment. Vertebrae: Vertebral body height maintained without evidence for acute or chronic fracture. Visualized sacrum and pelvis intact. SI joints approximated symmetric. No discrete lytic or blastic osseous lesions. Paraspinal and other soft tissues: Paraspinous soft tissues demonstrate no acute finding. Advanced aorto bi-iliac atherosclerotic disease. No visible aneurysm. Remainder the visualized visceral structures otherwise unremarkable. Disc levels:  T12-L1: Chronic intervertebral disc space narrowing with mild diffuse disc bulge and disc desiccation. Reactive endplate changes with marginal endplate osteophytic spurring. Mild facet hypertrophy. No significant spinal stenosis. Mild right with moderate left foraminal narrowing. L1-2: Chronic intervertebral disc space narrowing with diffuse disc bulge and disc desiccation. Reactive endplate changes with marginal endplate osteophytic spurring. Moderate facet hypertrophy, slightly greater on the left. No significant spinal stenosis. Moderate left worse than right L1 foraminal stenosis. L2-3: Diffuse disc bulge with intervertebral disc space narrowing. Mild reactive endplate changes. Moderate facet hypertrophy. Resultant mild spinal stenosis. Moderate bilateral L2 foraminal narrowing. L3-4: Chronic intervertebral disc space narrowing with disc desiccation and diffuse disc bulge. Moderate facet and ligament flavum hypertrophy. Resultant moderate spinal stenosis. Moderate bilateral L3 foraminal narrowing, slightly worse on the right. L4-5: Diffuse disc bulge with  disc desiccation and intervertebral disc space narrowing. Reactive endplate changes. Moderate to advanced right worse than left facet hypertrophy. Resultant severe bilateral lateral recess stenosis, slightly worse on the right. Severe right with moderate left L4 foraminal narrowing. L5-S1: Transitional lumbosacral anatomy with sacralization of the L5 vertebral body and rudimentary L5-S1 interspace. No significant disc bulge. Mild bilateral facet hypertrophy. No stenosis or impingement. IMPRESSION: 1. No CT evidence for acute abnormality within the lumbar spine. 2. Moderate multilevel degenerative spondylolysis with resultant mild to moderate spinal stenosis at L2-3 and L3-4, with severe bilateral lateral recess stenosis at L4-5. 3. Multifactorial degenerative changes with resultant moderate to advanced multilevel foraminal narrowing as above, most notable at  L4-5. 4. Transitional lumbosacral anatomy. 5. Advanced aorto bi-iliac atherosclerotic disease. Electronically Signed   By: Jeannine Boga M.D.   On: 11/06/2018 21:59   Ct Pelvis Wo Contrast  Result Date: 11/06/2018 CLINICAL DATA:  Left leg pain for 2 weeks.  No known injury. EXAM: CT OF THE PELVIS EXTREMITY WITHOUT CONTRAST TECHNIQUE: Multidetector CT imaging of the pelvis was performed according to the standard protocol. COMPARISON:  Plain films left hip earlier today. FINDINGS: Bones/Joint/Cartilage No acute bony or joint abnormality is identified. Moderate to moderately severe bilateral hip osteoarthritis is seen is worse on the left. Lower lumbar degenerative disc disease is noted. There is also some degenerative change about the SI joints, worse on the left Ligaments Suboptimally assessed by CT. Muscles and Tendons Appear normal. Soft tissues No acute abnormality is identified. A left lower quadrant hernia contains loops of bowel without obstruction or other complicating feature. There is a small fat containing midline ventral hernia. Small fat containing inguinal hernias are seen. Atherosclerosis is noted. IMPRESSION: No acute abnormality. Left worse than right hip osteoarthritis. Lower lumbar degenerative change also noted. Atherosclerosis. Left lower quadrant hernia contains loops of bowel without complicating feature. Small fat containing inguinal hernias and midline ventral hernia are also seen. Electronically Signed   By: Inge Rise M.D.   On: 11/06/2018 21:50   Dg Chest Portable 1 View  Result Date: 11/06/2018 CLINICAL DATA:  Shortness of breath. EXAM: PORTABLE CHEST 1 VIEW COMPARISON:  October 16, 2017 FINDINGS: Cardiomediastinal silhouette is normal. Mediastinal contours appear intact. Calcific atherosclerotic disease of the aorta. There is no evidence of focal airspace consolidation, pleural effusion or pneumothorax. Osseous structures are without acute abnormality. Soft tissues are  grossly normal. IMPRESSION: No active disease. Electronically Signed   By: Fidela Salisbury M.D.   On: 11/06/2018 19:27   Dg Hip Unilat W Or Wo Pelvis 2-3 Views Left  Result Date: 11/06/2018 CLINICAL DATA:  Pain, left leg pain for 2 weeks. EXAM: DG HIP (WITH OR WITHOUT PELVIS) 2-3V LEFT COMPARISON:  Right hip radiographs November 13, 2003 FINDINGS: Diffuse osteopenia may limit detection of small, nondisplaced fractures. No visible fracture or traumatic malalignment is evident. The femoral heads remain normally located. No proximal femoral fractures are identified. There are moderate hypertrophic osteoarthrosis features in both hips as well as at the SI joints and symphysis pubis. The sacrum is partially obscured by overlying bowel gas. Discogenic and facet degenerative changes are present in the lower lumbar spine most pronounced at the lumbosacral junction. Gaseous distention of what appears to be the stomach. Remainder of the bowel gas pattern is unremarkable. Soft tissues are otherwise free of acute abnormality. IMPRESSION: 1. Diffuse osteopenia may limit detection of small, nondisplaced fractures. No visible fracture or traumatic malalignment. 2. Moderate degenerative changes of the hips and  SI joints. Electronically Signed   By: Lovena Le M.D.   On: 11/06/2018 19:25    EKG: Independently reviewed.    Assessment/Plan Principal Problem:   COPD exacerbation ([place]) Observation/telemetry. Continue supplemental oxygen. Continue Solu-Medrol 40 mg IVP every 12 hours. Switch to oral prednisone on discharge. DuoNeb every 4 hours as needed.  Active Problems:   Leg pain Secondary to multi-joint osteoarthritis. Continue acetaminophen as needed. Continue tramadol as needed. Consult PT in a.m.    Hypertension Continue losartan 100 mg p.o. daily.    GERD (gastroesophageal reflux disease) Continue PPI.   DVT prophylaxis: Lovenox SQ. Code Status: DNR. Family Communication: Disposition Plan:  Observation for COPD exacerbation treatment and PT evaluation in a.m. Consults called: Admission status: Observation/telemetry.   Reubin Milan MD [facility]  If 7PM-7AM, please contact night-coverage [email or web address]  11/06/2018, 11:02 PM   This document was prepared using Dragon voice recognition software and may contain some unintended transcription errors.

## 2018-11-06 NOTE — ED Notes (Addendum)
Pt got out of bed and walked to the door to ask for help. This Probation officer and Kerin Ransom, RN assisted pt bed. Pt kept saying "I need help," but could not say what. Pt says the only reason why she came in was for her left leg and not her breathing. Pt was upset and said, "You don't know what it's like to be 85 yo and not be able to breath." Pt was asked to remain in her bed and use her call bell.

## 2018-11-06 NOTE — ED Provider Notes (Signed)
[facility] DEPT Provider Note   CSN: [id number] Arrival date & time: 11/06/18  1728    History   Chief Complaint Chief Complaint  Patient presents with  . Shortness of Breath  Leg pain  HPI Sierra Warner is a 85 y.o. female.     HPI Pt states she is here because of leg pain.  The pain shoots all the way down her left leg.  Pt started having pain about a week ago.  It hurts to move her leg and walk on her left leg.  No recent falls.  Pt called her hospice nurse and she was told to come to the ED. patient states she was taking some medication.  She is not exactly sure the name of it but it has not been helping.  She denies any abdominal pain.  She has been having some back pain.  No fevers or chills.  No dysuria.  Pt also has severe COPD.   She started to feel more short of breath on the way here.   She is chronically on o2 at home.  No recent fever.  No CP.  She has been coughing.  Patient does not think she has been exposed to anyone with covid. Past Medical History:  Diagnosis Date  . Atrophic vaginitis   . COPD (chronic obstructive pulmonary disease) ([place])   . COPD, mild ([place])   . Hemorrhoids   . Hypertension   . Microscopic hematuria   . Osteopenia     Patient Active Problem List   Diagnosis Date Noted  . GERD (gastroesophageal reflux disease) 10/16/2017  . Varicose veins of both legs with edema 08/23/2016  . Palliative care encounter   . Goals of care, counseling/discussion 06/16/2016  . Dyspnea 06/15/2016  . COPD with acute exacerbation ([place]) 06/07/2016  . Hyponatremia 06/07/2016  . COPD exacerbation ([place])   . Dry skin 04/17/2015  . COPD, severe ([place]) 02/17/2014  . Environmental and seasonal allergies 06/09/2013  . Acute on chronic respiratory failure ([place]) 06/06/2011  . Stage 4 very severe COPD by GOLD classification ([place]) 04/04/2011  . Hypertension 04/04/2011  . Senile osteoporosis 10/19/2010    Past Surgical History:   Procedure Laterality Date  . APPENDECTOMY  1933  . CATARACT SURGERY    . CYSTOSCOPY    . THYROIDECTOMY, PARTIAL    . TONSILLECTOMY       OB History    Gravida  3   Para  3   Term  3   Preterm  0   AB  0   Living        SAB  0   TAB  0   Ectopic  0   Multiple      Live Births               Home Medications    Prior to Admission medications   Medication Sig Start Date End Date Taking? Authorizing Provider  ALPRAZolam (XANAX) 0.25 MG tablet Take 1 tablet (0.25 mg total) by mouth 3 (three) times daily as needed for anxiety. 06/18/16   Rosita Fire, MD  Calcium Carbonate-Vitamin D (CALCIUM + D PO) Take by mouth 2 (two) times daily.      [provider]  cholecalciferol (VITAMIN D) 1000 UNITS tablet Take 1,000 Units by mouth daily.    [provider]  COMBIVENT RESPIMAT 20-100 MCG/ACT AERS respimat INHALE 1 PUFF INTO THE LUNGS EVERY 6 HOURS AS NEEDED FOR WHEEZING OR SHORTNESS  OF BREATH 05/14/18   Brand Males, MD  doxycycline (VIBRA-TABS) 100 MG tablet Take 1 tablet (100 mg total) by mouth 2 (two) times daily. 05/14/18   Lauraine Rinne, NP  fluticasone (FLONASE) 50 MCG/ACT nasal spray Place 2 sprays into both nostrils daily. 02/08/18   Brand Males, MD  furosemide (LASIX) 20 MG tablet TAKE 1/2 TABLET BY MOUTH DAILY 08/23/17   Brand Males, MD  HYDROMET 5-1.5 MG/5ML syrup Take 5 mLs by mouth every 4 (four) hours as needed for cough. 09/18/17   Baird Lyons D, MD  ipratropium-albuterol (DUONEB) 0.5-2.5 (3) MG/3ML SOLN USE 1 VIAL VIA NEBULIZER FOUR TIMES DAILY AS NEEDED 11/15/17   Brand Males, MD  losartan (COZAAR) 100 MG tablet TAKE 1 TABLET BY MOUTH DAILY 11/15/17   Brand Males, MD  Multiple Vitamins-Minerals (CENTRUM SILVER 50+WOMEN) TABS Take 1 tablet by mouth.    [provider]  Polyethylene Glycol 3350 (MIRALAX PO) Take 1 packet by mouth daily.     [provider]  Donnal Debar 100-62.5-25 MCG/INH  AEPB INHALE 1 PUFF INTO THE LUNGS EVERY DAY. 07/28/17   Brand Males, MD    Family History Family History  Problem Relation Age of Onset  . Diabetes Mother   . Hypertension Mother   . Heart disease Father   . Hypertension Father   . Heart failure Father     Social History Social History   Tobacco Use  . Smoking status: Former Smoker    Packs/day: 0.30    Years: 45.00    Pack years: 13.50    Types: Cigarettes    Quit date: 03/22/2007    Years since quitting: 11.6  . Smokeless tobacco: Never Used  Substance Use Topics  . Alcohol use: Yes    Alcohol/week: 4.0 standard drinks    Types: 4 Standard drinks or equivalent per week  . Drug use: No     Allergies   Sulfa antibiotics   Review of Systems Review of Systems  All other systems reviewed and are negative.    Physical Exam Updated Vital Signs BP (!) 147/79   Pulse (!) 111   Temp 98.2 F (36.8 C) (Oral)   Resp (!) 32   SpO2 96%   Physical Exam Vitals signs and nursing note reviewed.  Constitutional:      Appearance: She is well-developed.     Comments: Frail, elderly  HENT:     Head: Normocephalic and atraumatic.     Right Ear: External ear normal.     Left Ear: External ear normal.  Eyes:     General: No scleral icterus.       Right eye: No discharge.        Left eye: No discharge.     Conjunctiva/sclera: Conjunctivae normal.  Neck:     Musculoskeletal: Neck supple.     Trachea: No tracheal deviation.  Cardiovascular:     Rate and Rhythm: Normal rate and regular rhythm.  Pulmonary:     Effort: Tachypnea and accessory muscle usage present.     Breath sounds: No stridor. Decreased breath sounds present. No wheezing or rales.  Abdominal:     General: Bowel sounds are normal. There is no distension.     Palpations: Abdomen is soft.     Tenderness: There is no abdominal tenderness. There is no guarding or rebound.     Comments: Umbilical hernia noted, questionable left lower quadrant hernia but  soft and not incarcerated, no abdominal tenderness, no inguinal hernia  noted  Musculoskeletal:     Left hip: She exhibits tenderness.     Lumbar back: She exhibits tenderness.  Skin:    General: Skin is warm and dry.     Findings: No rash.  Neurological:     Mental Status: She is alert.     Cranial Nerves: No cranial nerve deficit (no facial droop, extraocular movements intact, no slurred speech).     Sensory: No sensory deficit.     Motor: No abnormal muscle tone or seizure activity.     Coordination: Coordination normal.      ED Treatments / Results  Labs (all labs ordered are listed, but only abnormal results are displayed) Labs Reviewed  BASIC METABOLIC PANEL - Abnormal; Notable for the following components:      Result Value   Glucose, Bld 120 (*)    All other components within normal limits  SARS CORONAVIRUS 2 (HOSPITAL ORDER, [facility])  CBC  URINALYSIS, ROUTINE W REFLEX MICROSCOPIC    EKG None  Radiology Ct Pelvis Wo Contrast  Result Date: 11/06/2018 CLINICAL DATA:  Left leg pain for 2 weeks.  No known injury. EXAM: CT OF THE PELVIS EXTREMITY WITHOUT CONTRAST TECHNIQUE: Multidetector CT imaging of the pelvis was performed according to the standard protocol. COMPARISON:  Plain films left hip earlier today. FINDINGS: Bones/Joint/Cartilage No acute bony or joint abnormality is identified. Moderate to moderately severe bilateral hip osteoarthritis is seen is worse on the left. Lower lumbar degenerative disc disease is noted. There is also some degenerative change about the SI joints, worse on the left Ligaments Suboptimally assessed by CT. Muscles and Tendons Appear normal. Soft tissues No acute abnormality is identified. A left lower quadrant hernia contains loops of bowel without obstruction or other complicating feature. There is a small fat containing midline ventral hernia. Small fat containing inguinal hernias are seen. Atherosclerosis is  noted. IMPRESSION: No acute abnormality. Left worse than right hip osteoarthritis. Lower lumbar degenerative change also noted. Atherosclerosis. Left lower quadrant hernia contains loops of bowel without complicating feature. Small fat containing inguinal hernias and midline ventral hernia are also seen. Electronically Signed   By: Inge Rise M.D.   On: 11/06/2018 21:50   Dg Chest Portable 1 View  Result Date: 11/06/2018 CLINICAL DATA:  Shortness of breath. EXAM: PORTABLE CHEST 1 VIEW COMPARISON:  October 16, 2017 FINDINGS: Cardiomediastinal silhouette is normal. Mediastinal contours appear intact. Calcific atherosclerotic disease of the aorta. There is no evidence of focal airspace consolidation, pleural effusion or pneumothorax. Osseous structures are without acute abnormality. Soft tissues are grossly normal. IMPRESSION: No active disease. Electronically Signed   By: Fidela Salisbury M.D.   On: 11/06/2018 19:27   Dg Hip Unilat W Or Wo Pelvis 2-3 Views Left  Result Date: 11/06/2018 CLINICAL DATA:  Pain, left leg pain for 2 weeks. EXAM: DG HIP (WITH OR WITHOUT PELVIS) 2-3V LEFT COMPARISON:  Right hip radiographs November 13, 2003 FINDINGS: Diffuse osteopenia may limit detection of small, nondisplaced fractures. No visible fracture or traumatic malalignment is evident. The femoral heads remain normally located. No proximal femoral fractures are identified. There are moderate hypertrophic osteoarthrosis features in both hips as well as at the SI joints and symphysis pubis. The sacrum is partially obscured by overlying bowel gas. Discogenic and facet degenerative changes are present in the lower lumbar spine most pronounced at the lumbosacral junction. Gaseous distention of what appears to be the stomach. Remainder of the bowel gas pattern is  unremarkable. Soft tissues are otherwise free of acute abnormality. IMPRESSION: 1. Diffuse osteopenia may limit detection of small, nondisplaced fractures. No visible  fracture or traumatic malalignment. 2. Moderate degenerative changes of the hips and SI joints. Electronically Signed   By: Lovena Le M.D.   On: 11/06/2018 19:25    Procedures Procedures (including critical care time)  Medications Ordered in ED Medications  methylPREDNISolone sodium succinate (SOLU-MEDROL) 125 mg/2 mL injection 125 mg (has no administration in time range)  morphine 4 MG/ML injection 4 mg (4 mg Intravenous Given 11/06/18 1832)  albuterol (VENTOLIN HFA) 108 (90 Base) MCG/ACT inhaler 8 puff (8 puffs Inhalation Given 11/06/18 1821)  morphine 4 MG/ML injection 4 mg (4 mg Intravenous Given 11/06/18 2040)     Initial Impression / Assessment and Plan / ED Course  I have reviewed the triage vital signs and the nursing notes.  Pertinent labs & imaging results that were available during my care of the patient were reviewed by me and considered in my medical decision making (see chart for details).  Clinical Course as of Nov 05 2201  Tue Nov 06, 2018  2029 Patient keeps asking on when she can go home but she was not able to complete her x-ray tests.  I explained her that I was still waiting to check on her spine films as well as a urine sample.  Patient still seems to be having a fair amount of pain when she tries to stand.  I will do a CT scan and I will order another dose of pain meds.   [PN]  3614 CT scan findings reviewed with patient.  Patient has degenerative hip changes.  Hernia noted but no signs of any incarceration and these are incidental.   [JK]  2202 Patient continues to remain tachypneic.  She does have severe COPD and keeps telling me that this is not the main reason that she is here but I will order a dose of Solu-Medrol and she has been given additional butyryl treatments.   [JK]    Clinical Course User Index [JK] Dorie Rank, MD     Patient has severe COPD.  She is on hospice care.  Patient presented with severe hip pain.  Patient has been giving her IV dose of  narcotics.  She continues to remain uncomfortable.  Patient's breathing status is certainly abnormal with her tachypnea and decreased breath sounds.  Try to see how much of this is acute because the patient keeps on asking about her hip and not her breathing however I do not think she be a good candidate for discharge.  I think she would benefit from admission for pain management.  We can also continue to try to optimize her breathing.  I will consult the medical service for admission.  Final Clinical Impressions(s) / ED Diagnoses   Final diagnoses:  Arthritis of left hip  COPD exacerbation (HCC)       Dorie Rank, MD 11/06/18 2203

## 2018-11-06 NOTE — ED Triage Notes (Signed)
Per EMS, patient from home, c/o SOB and left leg pain x2 weeks. Patient is on hospice. Denies fever, cough. Patient anxious upon EMS arrival. Relief from home inhaler. Denies cough and fever. Also c/o pain to LLQ from hernia x2 weeks. Ambulatory.   Refused IV start by EMS.

## 2018-11-06 NOTE — ED Notes (Signed)
ED Provider at bedside. 

## 2018-11-07 DIAGNOSIS — Z79899 Other long term (current) drug therapy: Secondary | ICD-10-CM | POA: Diagnosis not present

## 2018-11-07 DIAGNOSIS — K219 Gastro-esophageal reflux disease without esophagitis: Secondary | ICD-10-CM | POA: Diagnosis not present

## 2018-11-07 DIAGNOSIS — Z7951 Long term (current) use of inhaled steroids: Secondary | ICD-10-CM | POA: Diagnosis not present

## 2018-11-07 DIAGNOSIS — R0600 Dyspnea, unspecified: Secondary | ICD-10-CM | POA: Diagnosis present

## 2018-11-07 DIAGNOSIS — Z8249 Family history of ischemic heart disease and other diseases of the circulatory system: Secondary | ICD-10-CM | POA: Diagnosis not present

## 2018-11-07 DIAGNOSIS — Z882 Allergy status to sulfonamides status: Secondary | ICD-10-CM | POA: Diagnosis not present

## 2018-11-07 DIAGNOSIS — J9610 Chronic respiratory failure, unspecified whether with hypoxia or hypercapnia: Secondary | ICD-10-CM | POA: Diagnosis present

## 2018-11-07 DIAGNOSIS — K5900 Constipation, unspecified: Secondary | ICD-10-CM | POA: Diagnosis present

## 2018-11-07 DIAGNOSIS — I10 Essential (primary) hypertension: Secondary | ICD-10-CM

## 2018-11-07 DIAGNOSIS — M810 Age-related osteoporosis without current pathological fracture: Secondary | ICD-10-CM | POA: Diagnosis present

## 2018-11-07 DIAGNOSIS — Z66 Do not resuscitate: Secondary | ICD-10-CM | POA: Diagnosis present

## 2018-11-07 DIAGNOSIS — Z87891 Personal history of nicotine dependence: Secondary | ICD-10-CM | POA: Diagnosis not present

## 2018-11-07 DIAGNOSIS — M79605 Pain in left leg: Secondary | ICD-10-CM | POA: Diagnosis not present

## 2018-11-07 DIAGNOSIS — K4090 Unilateral inguinal hernia, without obstruction or gangrene, not specified as recurrent: Secondary | ICD-10-CM | POA: Diagnosis present

## 2018-11-07 DIAGNOSIS — R823 Hemoglobinuria: Secondary | ICD-10-CM | POA: Diagnosis present

## 2018-11-07 DIAGNOSIS — M160 Bilateral primary osteoarthritis of hip: Secondary | ICD-10-CM | POA: Diagnosis present

## 2018-11-07 DIAGNOSIS — K439 Ventral hernia without obstruction or gangrene: Secondary | ICD-10-CM | POA: Diagnosis present

## 2018-11-07 DIAGNOSIS — Z20828 Contact with and (suspected) exposure to other viral communicable diseases: Secondary | ICD-10-CM | POA: Diagnosis present

## 2018-11-07 DIAGNOSIS — J441 Chronic obstructive pulmonary disease with (acute) exacerbation: Secondary | ICD-10-CM | POA: Diagnosis not present

## 2018-11-07 MED ORDER — SODIUM CHLORIDE 0.9 % IV SOLN
1.0000 g | INTRAVENOUS | Status: DC
  Administered 2018-11-07 – 2018-11-08 (×2): 1 g via INTRAVENOUS
  Filled 2018-11-07 (×2): qty 1

## 2018-11-07 MED ORDER — PANTOPRAZOLE SODIUM 40 MG PO TBEC
40.0000 mg | DELAYED_RELEASE_TABLET | Freq: Every day | ORAL | Status: DC
  Administered 2018-11-07 – 2018-11-09 (×3): 40 mg via ORAL
  Filled 2018-11-07 (×3): qty 1

## 2018-11-07 MED ORDER — IPRATROPIUM-ALBUTEROL 0.5-2.5 (3) MG/3ML IN SOLN: 3.0000 mL | Freq: Four times a day (QID) | RESPIRATORY_TRACT | Status: DC

## 2018-11-07 MED ORDER — SODIUM CHLORIDE 0.9 % IV SOLN
INTRAVENOUS | Status: DC | PRN
  Administered 2018-11-07: 250 mL via INTRAVENOUS

## 2018-11-07 MED ORDER — BUDESONIDE 0.5 MG/2ML IN SUSP: 0.5000 mg | Freq: Two times a day (BID) | RESPIRATORY_TRACT | Status: DC

## 2018-11-07 MED ORDER — TEMAZEPAM 15 MG PO CAPS
15.0000 mg | ORAL_CAPSULE | Freq: Every evening | ORAL | Status: DC | PRN
  Administered 2018-11-08: 15 mg via ORAL
  Filled 2018-11-07: qty 1

## 2018-11-07 MED ORDER — LORATADINE 10 MG PO TABS
10.0000 mg | ORAL_TABLET | Freq: Every day | ORAL | Status: DC
  Administered 2018-11-07 – 2018-11-09 (×3): 10 mg via ORAL
  Filled 2018-11-07 (×3): qty 1

## 2018-11-07 MED ORDER — POLYETHYLENE GLYCOL 3350 17 G PO PACK
17.0000 g | PACK | Freq: Every day | ORAL | Status: DC
  Administered 2018-11-07 – 2018-11-09 (×3): 17 g via ORAL
  Filled 2018-11-07 (×3): qty 1

## 2018-11-07 MED ORDER — BUDESONIDE 0.5 MG/2ML IN SUSP
0.5000 mg | Freq: Two times a day (BID) | RESPIRATORY_TRACT | Status: DC
  Administered 2018-11-07 – 2018-11-09 (×4): 0.5 mg via RESPIRATORY_TRACT
  Filled 2018-11-07 (×4): qty 2

## 2018-11-07 MED ORDER — LORAZEPAM 1 MG PO TABS
1.0000 mg | ORAL_TABLET | Freq: Once | ORAL | Status: AC
  Administered 2018-11-07: 1 mg via ORAL
  Filled 2018-11-07: qty 1

## 2018-11-07 MED ORDER — TRAMADOL HCL 50 MG PO TABS
25.0000 mg | ORAL_TABLET | Freq: Three times a day (TID) | ORAL | Status: DC | PRN
  Administered 2018-11-07 – 2018-11-08 (×3): 25 mg via ORAL
  Filled 2018-11-07 (×3): qty 1

## 2018-11-07 MED ORDER — IPRATROPIUM-ALBUTEROL 0.5-2.5 (3) MG/3ML IN SOLN
3.0000 mL | RESPIRATORY_TRACT | Status: DC | PRN
  Administered 2018-11-07 – 2018-11-09 (×2): 3 mL via RESPIRATORY_TRACT
  Filled 2018-11-07 (×2): qty 3

## 2018-11-07 MED ORDER — IPRATROPIUM-ALBUTEROL 0.5-2.5 (3) MG/3ML IN SOLN
3.0000 mL | Freq: Three times a day (TID) | RESPIRATORY_TRACT | Status: DC
  Administered 2018-11-07 – 2018-11-09 (×6): 3 mL via RESPIRATORY_TRACT
  Filled 2018-11-07 (×6): qty 3

## 2018-11-07 MED ORDER — GUAIFENESIN ER 600 MG PO TB12
600.0000 mg | ORAL_TABLET | Freq: Two times a day (BID) | ORAL | Status: DC
  Administered 2018-11-07 – 2018-11-09 (×5): 600 mg via ORAL
  Filled 2018-11-07 (×5): qty 1

## 2018-11-07 MED ORDER — LOSARTAN POTASSIUM 50 MG PO TABS
100.0000 mg | ORAL_TABLET | Freq: Every day | ORAL | Status: DC
  Administered 2018-11-07 – 2018-11-09 (×3): 100 mg via ORAL
  Filled 2018-11-07 (×3): qty 2

## 2018-11-07 MED ORDER — ACETAMINOPHEN 500 MG PO TABS
500.0000 mg | ORAL_TABLET | Freq: Three times a day (TID) | ORAL | Status: DC
  Administered 2018-11-07 – 2018-11-09 (×8): 500 mg via ORAL
  Filled 2018-11-07 (×8): qty 1

## 2018-11-07 MED ORDER — FLUTICASONE PROPIONATE 50 MCG/ACT NA SUSP
2.0000 | Freq: Every day | NASAL | Status: DC
  Administered 2018-11-07 – 2018-11-09 (×3): 2 via NASAL
  Filled 2018-11-07: qty 16

## 2018-11-07 MED ORDER — IPRATROPIUM-ALBUTEROL 0.5-2.5 (3) MG/3ML IN SOLN
3.0000 mL | Freq: Four times a day (QID) | RESPIRATORY_TRACT | Status: DC
  Administered 2018-11-07 (×2): 3 mL via RESPIRATORY_TRACT
  Filled 2018-11-07 (×2): qty 3

## 2018-11-07 MED ORDER — ALPRAZOLAM 0.25 MG PO TABS
0.2500 mg | ORAL_TABLET | Freq: Three times a day (TID) | ORAL | Status: DC | PRN
  Administered 2018-11-07 – 2018-11-09 (×5): 0.25 mg via ORAL
  Filled 2018-11-07 (×5): qty 1

## 2018-11-07 MED ORDER — ENSURE ENLIVE PO LIQD
237.0000 mL | Freq: Two times a day (BID) | ORAL | Status: DC
  Administered 2018-11-07 – 2018-11-09 (×4): 237 mL via ORAL

## 2018-11-07 NOTE — TOC Initial Note (Signed)
Transition of Care [facility]) - Initial/Assessment Note    Patient Details  Name: Sierra Warner MRN: [id number] Date of Birth: [birth year]  Transition of Care [facility]) CM/SW Contact:    Dessa Phi, RN Phone Number: 11/07/2018, 12:26 PM  Clinical Narrative: Active w/[facility]-home hospice, home 02,DNR-rep [facility] Malta following-aware of need to increase Home services.Will need [facility] service @ d/c. CM contacted dtr Jackie [phone]-wants update from medical team.                       Patient Goals and CMS Choice        Expected Discharge Plan and Services                                                Prior Living Arrangements/Services                       Activities of Daily Living Home Assistive Devices/Equipment: Blood pressure cuff ADL Screening (condition at time of admission) Patient's cognitive ability adequate to safely complete daily activities?: Yes Is the patient deaf or have difficulty hearing?: No Does the patient have difficulty seeing, even when wearing glasses/contacts?: No Does the patient have difficulty concentrating, remembering, or making decisions?: Yes Patient able to express need for assistance with ADLs?: Yes Does the patient have difficulty dressing or bathing?: Yes Independently performs ADLs?: No(has a hospice nurse) Communication: Needs assistance Is this a change from baseline?: Pre-admission baseline Dressing (OT): Needs assistance Is this a change from baseline?: Pre-admission baseline Grooming: Needs assistance Is this a change from baseline?: Pre-admission baseline Feeding: Needs assistance Is this a change from baseline?: Pre-admission baseline Bathing: Needs assistance Is this a change from baseline?: Pre-admission baseline Toileting: Needs assistance Is this a change from baseline?: Pre-admission baseline In/Out Bed: Needs assistance Is this a change from baseline?: Pre-admission baseline Walks in Home: Needs  assistance Is this a change from baseline?: Pre-admission baseline Does the patient have difficulty walking or climbing stairs?: Yes Weakness of Legs: Left Weakness of Arms/Hands: None  Permission Sought/Granted                  Emotional Assessment              Admission diagnosis:  COPD exacerbation ([place]) [J44.1] Arthritis of left hip [M16.12] Patient Active Problem List   Diagnosis Date Noted  . Leg pain 11/06/2018  . GERD (gastroesophageal reflux disease) 10/16/2017  . Varicose veins of both legs with edema 08/23/2016  . Palliative care encounter   . Goals of care, counseling/discussion 06/16/2016  . Dyspnea 06/15/2016  . COPD with acute exacerbation ([place]) 06/07/2016  . Hyponatremia 06/07/2016  . COPD exacerbation ([place])   . Dry skin 04/17/2015  . COPD, severe ([place]) 02/17/2014  . Environmental and seasonal allergies 06/09/2013  . Acute on chronic respiratory failure ([place]) 06/06/2011  . Stage 4 very severe COPD by GOLD classification ([place]) 04/04/2011  . Hypertension 04/04/2011  . Senile osteoporosis 10/19/2010   PCP:  Patient, No Pcp Per Pharmacy:   [facility] [place] [place] - [address] AT [place] [address] Phone: [phone] Fax: [place], [address] [place] [address] Phone: [phone] Fax: [place], [place] - [address] AT [facility] [place] [address] [place] [address] Phone: [phone] Fax: [phone]     Social Determinants of Health (SDOH) Interventions    Readmission Risk Interventions No flowsheet data found.

## 2018-11-07 NOTE — Evaluation (Signed)
Physical Therapy Evaluation Patient Details Name: Sierra Warner MRN: [id number] DOB: [birth year] Today's Date: 11/07/2018   History of Present Illness  Sierra Warner is a 85 y.o. female with medical history significant of hypertension, microscopic hematuria, osteopenia, end-stage COPD who presented to the ED due to left lower extremity pain, particularly on the hip and knee.    Clinical Impression  Sierra Warner is 85 y.o. female admitted with above HPI. She reports independence and baseline with no assistive device and is currently limited by functional impairments (see PT Problem List below). She required supervision with cues for sequencing bed mobility today and min assist for sit<>stand transfers and gait. Patient was extremely unsteady with gait in hospital room with no device and improved balance with UE support on RW. She lives alone and her children live out of the area. Patient currently would benefit from increased supervision for mobility at home additional physical therapy to address impairments. If 24/supervision is unavailable for patient she would benefit from ongoing care at below venue. Acute PT will follow and progress mobility as able.    Follow Up Recommendations Home health PT;Supervision/Assistance - 24 hour;Supervision for mobility/OOB;SNF(if supervision and assistance is unavailable at home patient will benefit from placement in SNF to provide additional assistance)    Equipment Recommendations  Rolling walker with 5" wheels;3in1 (PT)    Recommendations for Other Services       Precautions / Restrictions Precautions Precautions: Fall Restrictions Weight Bearing Restrictions: No      Mobility  Bed Mobility Overal bed mobility: Needs Assistance Bed Mobility: Supine to Sit     Supine to sit: HOB elevated;Supervision     General bed mobility comments: HOB slightly elevated, verbal cues for sequencing required  Transfers Overall transfer level: Needs assistance    Transfers: Sit to/from Stand Sit to Stand: Min assist         General transfer comment: cues required for safe hand placement on RW from EOB, cues required for safety with sit<>stand transfer and no device form EOB and toilet as pt had tendency to move quickly with poor awareness of O2 line  Ambulation/Gait Ambulation/Gait assistance: Min assist Gait Distance (Feet): 80 Feet Assistive device: Rolling walker (2 wheeled);None Gait Pattern/deviations: Step-through pattern;Decreased stride length;Decreased step length - right;Decreased step length - left;Trunk flexed Gait velocity: decreased   General Gait Details: pt unsteady with no device and reaching for furniture in hospital room or wall for support throughout. pt with improved steadiness using RW for gait; cues throughout required to maintain safe proximety to [employer] Mobility    Modified Rankin (Stroke Patients Only)       Balance Overall balance assessment: Needs assistance Sitting-balance support: Feet supported;No upper extremity supported Sitting balance-Leahy Scale: Fair Sitting balance - Comments: pt used grab bars for dynamic sitting activities     Standing balance-Leahy Scale: Fair Standing balance comment: pt can perform static standing without support however requires external support with UE's for dynamic standing and gait            Pertinent Vitals/Pain Pain Assessment: No/denies pain    Home Living Family/patient expects to be discharged to:: Private residence Living Arrangements: Alone Available Help at Discharge: Family;Available PRN/intermittently;Personal care attendant(pt reprots she has a hospice nurse who assists her and assesses her medically 1x/week. her son lives in [place], [place] and her daughter lives in [place]) Type of Home: House Home Access: Stairs to enter Entrance Stairs-Rails: Left Entrance  Stairs-Number of Steps: 4 Home Layout: Two level;Able to live on main level  with bedroom/bathroom Home Equipment: Shower seat - built in;Cane - single point;Grab bars - tub/shower Additional Comments: pt is on O2 at home, she does not like to use the portable O2 tanks    Prior Function Level of Independence: Independent         Comments: pt reports she does not use assistive device for mobility and has been independent with all ADL's, she is still driving, she gets her groceries delivered to her house     Hand Dominance   Dominant Hand: Right    Extremity/Trunk Assessment   Upper Extremity Assessment Upper Extremity Assessment: Overall WFL for tasks assessed;Defer to OT evaluation    Lower Extremity Assessment Lower Extremity Assessment: Generalized weakness    Cervical / Trunk Assessment Cervical / Trunk Assessment: Kyphotic  Communication   Communication: No difficulties  Cognition Arousal/Alertness: Awake/alert Behavior During Therapy: WFL for tasks assessed/performed Overall Cognitive Status: Within Functional Limits for tasks assessed        General Comments: pt reports her leg seems to be feeling better this morning and with getting up             Assessment/Plan    PT Assessment Patient needs continued PT services  PT Problem List Decreased strength;Decreased balance;Decreased knowledge of use of DME;Decreased mobility;Decreased activity tolerance;Decreased safety awareness       PT Treatment Interventions Gait training;DME instruction    PT Goals (Current goals can be found in the Care Plan section)  Acute Rehab PT Goals Patient Stated Goal: to return home and not need as much oxygen support PT Goal Formulation: With patient Time For Goal Achievement: 11/16/18 Potential to Achieve Goals: Good    Frequency Min 3X/week   Barriers to discharge Decreased caregiver support pt will require increaesd supervision for mobility if she is to return home; she wishes to return home       AM-PAC PT "6 Clicks" Mobility  Outcome  Measure Help needed turning from your back to your side while in a flat bed without using bedrails?: A Little Help needed moving from lying on your back to sitting on the side of a flat bed without using bedrails?: A Little Help needed moving to and from a bed to a chair (including a wheelchair)?: A Little Help needed standing up from a chair using your arms (e.g., wheelchair or bedside chair)?: A Little Help needed to walk in hospital room?: A Little Help needed climbing 3-5 steps with a railing? : A Lot 6 Click Score: 17    End of Session Equipment Utilized During Treatment: Gait belt Activity Tolerance: Patient tolerated treatment well Patient left: in bed;with call bell/phone within reach;with bed alarm set;Other (comment)(with breakfast tray in front of her) Nurse Communication: Mobility status PT Visit Diagnosis: Unsteadiness on feet (R26.81);Muscle weakness (generalized) (M62.81);Difficulty in walking, not elsewhere classified (R26.2)    Time: 6789-3810 PT Time Calculation (min) (ACUTE ONLY): 38 min   Charges:   PT Evaluation $PT Eval Low Complexity: 1 Low PT Treatments $Gait Training: 8-22 mins        Kipp Brood, PT, DPT, [facility] Physical Therapist with [facility]  11/07/2018 12:45 PM

## 2018-11-07 NOTE — TOC Progression Note (Signed)
Transition of Care [facility]) - Progression Note    Patient Details  Name: Sierra Warner MRN: [id number] Date of Birth: [birth year]  Transition of [facility]) CM/SW Contact  Braeley Buskey, Juliann Pulse, RN Phone Number: 11/07/2018, 2:27 PM  Clinical Narrative:Spoke to dtr Kennyth Lose about d/c plans-d/c back home w/increased aide-custodial level care-independent decision. Home dme needed rw,3n1-[facility] rep [facility] Malta will order. Already on home 02. [facility] for transport @ d/c.            Expected Discharge Plan and Services                                                 Social Determinants of Health (SDOH) Interventions    Readmission Risk Interventions No flowsheet data found.

## 2018-11-07 NOTE — Progress Notes (Addendum)
[facility] Admission @11am   This is a related and covered hospital admission.  Ms. Pigford is under hospice services with a diagnosis of COPD per Dr. Karie Georges.  Ms. Daley has been complaining of severe lower abdominal/leg (left groin) area pain for a few weeks.  Yesterday, it was intolerable and she had a visit by her home care hospice RN.  There was a palpable mass at her left groin. Her pain was not controlled with tylenol or tramadol.  She was more confused yesterday than normal.  The decision was made to have her evaluated at the hospital to help with pain control.  She is admitted with leg pain.  Ms. Hellstrom lives alone and is normally independent for most ADLs.  She does have a maid that assists with house work.  Over the last month, she has been increasingly forgetful. Conversations have been occurring with her children as to what the next steps should be for living situations.  Her children are out of town.  Discussions for paid caregivers at home and/or ALF. Report exchanged with hospital staff.  V/S:  97.8 oral, 128/96, HR 94, RR 18, SPO2 99% 2 lpm [place] Lab work:  Glucose only abnormal @ 120 I&O:  360/700 PRN's/IVF:  Morphine 4mg  IV x 2, Mag 2g IV x 1, rocephin 1g IV QD Diagnostics:  CT Pelvis WO: IMPRESSION: No acute abnormality. Left worse than right hip osteoarthritis. Lower lumbar degenerative change also noted. Left lower quadrant hernia contains loops of bowel without complicating feature. Small fat containing inguinal hernias and midline ventral hernia are also seen.   Problem List: Leg pain-diagnostics showed no active disease.  Moderate DJD changes.  LLQ hernia. PT consult. COPD exacerbation-rocephin IV, duo-nebs PRN, solu-medrol 40 mg IV BID  D/C planning:  Ongoing.  Has been independent at home.  Concerns with increasing forgetfulness.  PT/OT eval, recommend SNF, family not interested.  Will order rolling walker and 3 in 1. IDT:  Updated.  Family:   Spoke with dtr Kennyth Lose, she will arrange for paid caregivers.  She currently has a maid that helps out regularly and Ms. Gillott has been resistant to adding help. GOC:  Clear.  DNR.  She wishes to stay at home, but he pain was intolerable for her.  Will place medication list and transfer summary on chart.  Once ready for d/c, please use [facility], they contract this service for our active pts.  Thank you, Venia Carbon RN, BSN, [facility] Liaison (in [place]) [phone]

## 2018-11-07 NOTE — Progress Notes (Addendum)
PROGRESS NOTE    [name]  DUK:[id number] DOB: September 22, 1928 DOA: 11/06/2018 PCP: Patient, No Pcp Per    Brief Narrative:  HPI per Dr. Enis Slipper ISSABELLA Warner is a 85 y.o. female with medical history significant of atrophic vaginitis, hemorrhoids, hypertension, microscopic hematuria, osteopenia end-stage COPD who came into the emergency department due to left lower extremity pain, particularly on the hip and knee, but radiating all the way down to her left foot.  She denies trauma to the area and has been taking azithromycin often with occasional tramadol.  She has been having wheezing and dyspnea.  She denies fever, chills, rhinorrhea, sore throat, hemoptysis.  No chest pain, palpitations, diaphoresis, PND, orthopnea or pitting edema of the lower extremities.  Denies abdominal pain, nausea or emesis, melena or hematochezia.  She gets frequent constipation.  No dysuria, frequency or hematuria.  Denies polyuria, polydipsia, polyphagia or blurred vision.s  ED Course: Initial vital signs temperature 98.2 F, pulse 94, respirations 25, blood pressure 154/68 mmHg and O2 sat 98% on room air.  Later in the evening the patient became tachypneic while in the emergency department.  She received bronchodilators along with supplemental oxygen and IV Solu-Medrol.  Her urinalysis had a hazy appearance, small hemoglobinuria and moderate leukocyte esterase.  CBC was normal.  BMP showed a glucose of 120 mg/dL but was otherwise unremarkable.    Imaging: chest radiograph did not show any active disease.  Hip x-ray showed moderate DJD changes.  CT pelvis no acute on normalities, but once again is show bilateral osteoarthritis.  There was a left lower quadrant hernia containing loops of bowel, but without a complicating feature.  There were small fat-containing inguinal hernias a midline ventral hernias please see images and full radiology report for further detail.   Assessment & Plan:   Principal Problem:   COPD  exacerbation ([place]) Active Problems:   Hypertension   GERD (gastroesophageal reflux disease)   Leg pain  1 acute exacerbation of end-stage COPD Patient noted on admission to be wheezing, with worsening shortness of breath.  Patient with end-stage COPD on home hospice.  Continue IV Solu-Medrol 40 mg every 12 hours, scheduled duo nebs.  Placed on Pulmicort twice daily.  Placed on Claritin, Flonase, IV Rocephin.  Follow.  2.  Left lower extremity pain Secondary to multi-joint osteoarthritis.  Place on scheduled Tylenol 500 mg 3 times daily.  Tramadol as needed.  PT/OT.  Supportive care.  3.  Hypertension Continue home regimen losartan.  4.  Gastroesophageal reflux disease PPI.   DVT prophylaxis: Lovenox Code Status: DNR Family Communication: Updated patient and daughter on telephone. Disposition Plan: Likely back home with hospice once hip pain is controlled and COPD exacerbation improved.   Consultants:   None  Procedures:   CT pelvis/CT L-spine 11/06/2018  Chest x-ray 11/06/2018  Plain films of the left hip and pelvis 11/06/2018  Antimicrobials:   IV Rocephin 11/07/2018   Subjective: Patient noted to be sitting up at bedside eating lunch and coughing.  Patient states still with some shortness of breath.  Patient states left hip pain improving on current regimen.  Denies any chest pain.  Feels her shortness of breath is worse than her baseline.  Objective: Vitals:   11/07/18 0055 11/07/18 0150 11/07/18 0613 11/07/18 0711  BP: (!) 181/79  (!) 128/96   Pulse: (!) 109  94   Resp: (!) 28  18   Temp: 97.8 F (36.6 C)  97.8 F (36.6 C)   TempSrc:  Oral  Oral   SpO2: 95% 95% 99% 97%  Weight:      Height:        Intake/Output Summary (Last 24 hours) at 11/07/2018 1236 Last data filed at 11/07/2018 1200 Gross per 24 hour  Intake 769.97 ml  Output 900 ml  Net -130.03 ml   Filed Weights   11/07/18 0044  Weight: 54 kg    Examination:  General exam: Appears calm and  comfortable  Respiratory system: Poor to fair air movement.  Minimal expiratory wheezing.  No rhonchi.  No crackles.   Cardiovascular system: S1 & S2 heard, RRR. No JVD, murmurs, rubs, gallops or clicks. No pedal edema. Gastrointestinal system: Abdomen is nondistended, soft and nontender. No organomegaly or masses felt. Normal bowel sounds heard. Central nervous system: Alert and oriented. No focal neurological deficits. Extremities: Symmetric 5 x 5 power. Skin: No rashes, lesions or ulcers Psychiatry: Judgement and insight appear normal. Mood & affect appropriate.     Data Reviewed: I have personally reviewed following labs and imaging studies  CBC: Recent Labs  Lab 11/06/18 1816  WBC 9.9  HGB 13.1  HCT 40.6  MCV 95.5  PLT 258   Basic Metabolic Panel: Recent Labs  Lab 11/06/18 1816  NA 137  K 4.3  CL 98  CO2 28  GLUCOSE 120*  BUN 21  CREATININE 0.79  CALCIUM 9.4  MG 2.4  PHOS 4.6   GFR: Estimated Creatinine Clearance: 40.2 mL/min (by C-G formula based on SCr of 0.79 mg/dL). Liver Function Tests: No results for input(s): AST, ALT, ALKPHOS, BILITOT, PROT, ALBUMIN in the last 168 hours. No results for input(s): LIPASE, AMYLASE in the last 168 hours. No results for input(s): AMMONIA in the last 168 hours. Coagulation Profile: No results for input(s): INR, PROTIME in the last 168 hours. Cardiac Enzymes: No results for input(s): CKTOTAL, CKMB, CKMBINDEX, TROPONINI in the last 168 hours. BNP (last 3 results) No results for input(s): PROBNP in the last 8760 hours. HbA1C: No results for input(s): HGBA1C in the last 72 hours. CBG: No results for input(s): GLUCAP in the last 168 hours. Lipid Profile: No results for input(s): CHOL, HDL, LDLCALC, TRIG, CHOLHDL, LDLDIRECT in the last 72 hours. Thyroid Function Tests: No results for input(s): TSH, T4TOTAL, FREET4, T3FREE, THYROIDAB in the last 72 hours. Anemia Panel: No results for input(s): VITAMINB12, FOLATE, FERRITIN,  TIBC, IRON, RETICCTPCT in the last 72 hours. Sepsis Labs: No results for input(s): PROCALCITON, LATICACIDVEN in the last 168 hours.  Recent Results (from the past 240 hour(s))  SARS Coronavirus 2 [facility] order, Performed in [facility] hospital lab) Nasopharyngeal Nasopharyngeal Swab     Status: None   Collection Time: 11/06/18 10:02 PM   Specimen: Nasopharyngeal Swab  Result Value Ref Range Status   SARS Coronavirus 2 NEGATIVE NEGATIVE Final    Comment: (NOTE) If result is NEGATIVE SARS-CoV-2 target nucleic acids are NOT DETECTED. The SARS-CoV-2 RNA is generally detectable in upper and lower  respiratory specimens during the acute phase of infection. The lowest  concentration of SARS-CoV-2 viral copies this assay can detect is 250  copies / mL. A negative result does not preclude SARS-CoV-2 infection  and should not be used as the sole basis for treatment or other  patient management decisions.  A negative result may occur with  improper specimen collection / handling, submission of specimen other  than nasopharyngeal swab, presence of viral mutation(s) within the  areas targeted by this assay, and inadequate number of  viral copies  (<250 copies / mL). A negative result must be combined with clinical  observations, patient history, and epidemiological information. If result is POSITIVE SARS-CoV-2 target nucleic acids are DETECTED. The SARS-CoV-2 RNA is generally detectable in upper and lower  respiratory specimens dur ing the acute phase of infection.  Positive  results are indicative of active infection with SARS-CoV-2.  Clinical  correlation with patient history and other diagnostic information is  necessary to determine patient infection status.  Positive results do  not rule out bacterial infection or co-infection with other viruses. If result is PRESUMPTIVE POSTIVE SARS-CoV-2 nucleic acids MAY BE PRESENT.   A presumptive positive result was obtained on the submitted  specimen  and confirmed on repeat testing.  While 2019 novel coronavirus  (SARS-CoV-2) nucleic acids may be present in the submitted sample  additional confirmatory testing may be necessary for epidemiological  and / or clinical management purposes  to differentiate between  SARS-CoV-2 and other Sarbecovirus currently known to infect humans.  If clinically indicated additional testing with an alternate test  methodology [phone]) is advised. The SARS-CoV-2 RNA is generally  detectable in upper and lower respiratory sp ecimens during the acute  phase of infection. The expected result is Negative. Fact Sheet for Patients:  StrictlyIdeas.no Fact Sheet for Healthcare Providers: BankingDealers.co.za This test is not yet approved or cleared by the [place] FDA and has been authorized for detection and/or diagnosis of SARS-CoV-2 by FDA under an Emergency Use Authorization (EUA).  This EUA will remain in effect (meaning this test can be used) for the duration of the COVID-19 declaration under Section 564(b)(1) of the Act, 21 U.S.C. section 360bbb-3(b)(1), unless the authorization is terminated or revoked sooner. Performed at [facility], [place] [address] [place], [place] [address]          Radiology Studies: Ct Lumbar Spine Wo Contrast  Result Date: 11/06/2018 CLINICAL DATA:  Initial evaluation for left lower extremity pain for 2 weeks. EXAM: CT LUMBAR SPINE WITHOUT CONTRAST TECHNIQUE: Multidetector CT imaging of the lumbar spine was performed without intravenous contrast administration. Multiplanar CT image reconstructions were also generated. COMPARISON:  None available. FINDINGS: Segmentation: Transitional lumbosacral anatomy. Lowest rib-bearing vertebral body labeled T12, and there is sacralization of the L5 vertebral body. L5-S1 disc is vestigial and rudimentary. Alignment: Trace dextroscoliosis with straightening of  the normal lumbar lordosis. No listhesis or malalignment. Vertebrae: Vertebral body height maintained without evidence for acute or chronic fracture. Visualized sacrum and pelvis intact. SI joints approximated symmetric. No discrete lytic or blastic osseous lesions. Paraspinal and other soft tissues: Paraspinous soft tissues demonstrate no acute finding. Advanced aorto bi-iliac atherosclerotic disease. No visible aneurysm. Remainder the visualized visceral structures otherwise unremarkable. Disc levels: T12-L1: Chronic intervertebral disc space narrowing with mild diffuse disc bulge and disc desiccation. Reactive endplate changes with marginal endplate osteophytic spurring. Mild facet hypertrophy. No significant spinal stenosis. Mild right with moderate left foraminal narrowing. L1-2: Chronic intervertebral disc space narrowing with diffuse disc bulge and disc desiccation. Reactive endplate changes with marginal endplate osteophytic spurring. Moderate facet hypertrophy, slightly greater on the left. No significant spinal stenosis. Moderate left worse than right L1 foraminal stenosis. L2-3: Diffuse disc bulge with intervertebral disc space narrowing. Mild reactive endplate changes. Moderate facet hypertrophy. Resultant mild spinal stenosis. Moderate bilateral L2 foraminal narrowing. L3-4: Chronic intervertebral disc space narrowing with disc desiccation and diffuse disc bulge. Moderate facet and ligament flavum hypertrophy. Resultant moderate spinal stenosis. Moderate bilateral L3 foraminal narrowing,  slightly worse on the right. L4-5: Diffuse disc bulge with disc desiccation and intervertebral disc space narrowing. Reactive endplate changes. Moderate to advanced right worse than left facet hypertrophy. Resultant severe bilateral lateral recess stenosis, slightly worse on the right. Severe right with moderate left L4 foraminal narrowing. L5-S1: Transitional lumbosacral anatomy with sacralization of the L5 vertebral  body and rudimentary L5-S1 interspace. No significant disc bulge. Mild bilateral facet hypertrophy. No stenosis or impingement. IMPRESSION: 1. No CT evidence for acute abnormality within the lumbar spine. 2. Moderate multilevel degenerative spondylolysis with resultant mild to moderate spinal stenosis at L2-3 and L3-4, with severe bilateral lateral recess stenosis at L4-5. 3. Multifactorial degenerative changes with resultant moderate to advanced multilevel foraminal narrowing as above, most notable at L4-5. 4. Transitional lumbosacral anatomy. 5. Advanced aorto bi-iliac atherosclerotic disease. Electronically Signed   By: Jeannine Boga M.D.   On: 11/06/2018 21:59   Ct Pelvis Wo Contrast  Result Date: 11/06/2018 CLINICAL DATA:  Left leg pain for 2 weeks.  No known injury. EXAM: CT OF THE PELVIS EXTREMITY WITHOUT CONTRAST TECHNIQUE: Multidetector CT imaging of the pelvis was performed according to the standard protocol. COMPARISON:  Plain films left hip earlier today. FINDINGS: Bones/Joint/Cartilage No acute bony or joint abnormality is identified. Moderate to moderately severe bilateral hip osteoarthritis is seen is worse on the left. Lower lumbar degenerative disc disease is noted. There is also some degenerative change about the SI joints, worse on the left Ligaments Suboptimally assessed by CT. Muscles and Tendons Appear normal. Soft tissues No acute abnormality is identified. A left lower quadrant hernia contains loops of bowel without obstruction or other complicating feature. There is a small fat containing midline ventral hernia. Small fat containing inguinal hernias are seen. Atherosclerosis is noted. IMPRESSION: No acute abnormality. Left worse than right hip osteoarthritis. Lower lumbar degenerative change also noted. Atherosclerosis. Left lower quadrant hernia contains loops of bowel without complicating feature. Small fat containing inguinal hernias and midline ventral hernia are also seen.  Electronically Signed   By: Inge Rise M.D.   On: 11/06/2018 21:50   Dg Chest Portable 1 View  Result Date: 11/06/2018 CLINICAL DATA:  Shortness of breath. EXAM: PORTABLE CHEST 1 VIEW COMPARISON:  October 16, 2017 FINDINGS: Cardiomediastinal silhouette is normal. Mediastinal contours appear intact. Calcific atherosclerotic disease of the aorta. There is no evidence of focal airspace consolidation, pleural effusion or pneumothorax. Osseous structures are without acute abnormality. Soft tissues are grossly normal. IMPRESSION: No active disease. Electronically Signed   By: Fidela Salisbury M.D.   On: 11/06/2018 19:27   Dg Hip Unilat W Or Wo Pelvis 2-3 Views Left  Result Date: 11/06/2018 CLINICAL DATA:  Pain, left leg pain for 2 weeks. EXAM: DG HIP (WITH OR WITHOUT PELVIS) 2-3V LEFT COMPARISON:  Right hip radiographs November 13, 2003 FINDINGS: Diffuse osteopenia may limit detection of small, nondisplaced fractures. No visible fracture or traumatic malalignment is evident. The femoral heads remain normally located. No proximal femoral fractures are identified. There are moderate hypertrophic osteoarthrosis features in both hips as well as at the SI joints and symphysis pubis. The sacrum is partially obscured by overlying bowel gas. Discogenic and facet degenerative changes are present in the lower lumbar spine most pronounced at the lumbosacral junction. Gaseous distention of what appears to be the stomach. Remainder of the bowel gas pattern is unremarkable. Soft tissues are otherwise free of acute abnormality. IMPRESSION: 1. Diffuse osteopenia may limit detection of small, nondisplaced fractures. No visible fracture or  traumatic malalignment. 2. Moderate degenerative changes of the hips and SI joints. Electronically Signed   By: Lovena Le M.D.   On: 11/06/2018 19:25        Scheduled Meds:  acetaminophen  500 mg Oral TID   budesonide (PULMICORT) nebulizer solution  0.5 mg Nebulization BID    enoxaparin (LOVENOX) injection  30 mg Subcutaneous Daily   fluticasone  2 spray Each Nare Daily   guaiFENesin  600 mg Oral BID   ipratropium-albuterol  3 mL Nebulization Q6H WA   loratadine  10 mg Oral Daily   losartan  100 mg Oral Daily   methylPREDNISolone (SOLU-MEDROL) injection  40 mg Intravenous Q12H   Followed by   Derrill Memo ON 11/08/2018] predniSONE  40 mg Oral Q breakfast   pantoprazole  40 mg Oral Daily   polyethylene glycol  17 g Oral Daily   Continuous Infusions:  sodium chloride 250 mL (11/07/18 1116)   cefTRIAXone (ROCEPHIN)  IV 1 g (11/07/18 1117)     LOS: 0 days    Time spent: 35 minutes    Irine Seal, MD [facility]  If 7PM-7AM, please contact night-coverage [email or web address] 11/07/2018, 12:36 PM

## 2018-11-07 NOTE — Progress Notes (Signed)
Initial Nutrition Assessment  RD working remotely.   DOCUMENTATION CODES:   Not applicable  INTERVENTION:  - will order Ensure Enlive BID, each supplement provides 350 kcal and 20 grams of protein. - continue to encourage PO intakes.    NUTRITION DIAGNOSIS:   Increased nutrient needs related to chronic illness(end-stage COPD) as evidenced by estimated needs.  GOAL:   Patient will meet greater than or equal to 90% of their needs  MONITOR:   PO intake, Supplement acceptance, Labs, Weight trends  REASON FOR ASSESSMENT:   Malnutrition Screening Tool, Consult Assessment of nutrition requirement/status  ASSESSMENT:   85 y.o. female with medical history significant of atrophic vaginitis, hemorrhoids, HTN, microscopic hematuria, osteopenia, and end-stage COPD. Patient presented to the ED d/t LLE pain, especially around the hip and knee areas. She has been experiencing wheezing and dyspnea. Patient reports frequent constipation.  Per flow sheet, patient consumed 100% of breakfast this AM. She reports that she has had a decreased appetite recently d/t pain and difficulty breathing (wheezing, difficulty catching her breath). She denies any chewing or swallowing issues. Per chart review, current weight is 119 lb and weight has been mainly stable over the past 1.5 years.   Per notes: - COPD exacerbation - leg pain 2/2 multi-joint osteoarthritis   Labs reviewed. Medications reviewed; 2 g IV Mg sulfate x1 run 8/18, 40 mg solu-medrol x2 doses 8/19, 40 mg deltasone/day starting 8/20, 1 packet miralax/day.      NUTRITION - FOCUSED PHYSICAL EXAM:  unable to complete at this time.   Diet Order:   Diet Order            Diet Heart Room service appropriate? Yes; Fluid consistency: Thin  Diet effective now              EDUCATION NEEDS:   No education needs have been identified at this time  Skin:  Skin Assessment: Reviewed RN Assessment  Last BM:  8/18  Height:   Ht  Readings from Last 1 Encounters:  11/07/18 5\' 3"  (1.6 m)    Weight:   Wt Readings from Last 1 Encounters:  11/07/18 54 kg    Ideal Body Weight:  52.3 kg  BMI:  Body mass index is 21.09 kg/m.  Estimated Nutritional Needs:   Kcal:  1700-1900 kcal  Protein:  70-85 grams  Fluid:  >/= 1.8     Jarome Matin, MS, RD, LDN, [facility] Inpatient Clinical Dietitian Pager # [phone] After hours/weekend pager # [phone]

## 2018-11-07 NOTE — ED Notes (Signed)
ED TO INPATIENT HANDOFF REPORT  Name/Age/Gender [place] 85 y.o. female  Code Status    Code Status Orders  (From admission, onward)         Start     Ordered   11/06/18 2217  Do not attempt resuscitation (DNR)  Continuous    Question Answer Comment  In the event of cardiac or respiratory ARREST Do not call a "code blue"   In the event of cardiac or respiratory ARREST Do not perform Intubation, CPR, defibrillation or ACLS   In the event of cardiac or respiratory ARREST Use medication by any route, position, wound care, and other measures to relive pain and suffering. May use oxygen, suction and manual treatment of airway obstruction as needed for comfort.      11/06/18 2217        Code Status History    Date Active Date Inactive Code Status Order ID Comments User Context   11/06/2018 2217 11/06/2018 2217 DNR [id number]  Reubin Milan, MD ED   06/14/2016 1341 06/18/2016 1921 DNR [id number]  Collene Gobble, MD Inpatient   06/13/2016 1646 06/14/2016 1341 Full Code [id number]  Patrecia Pour, MD ED   06/08/2016 0018 06/09/2016 1929 Full Code [id number]  Orson Eva, MD Inpatient   Advance Care Planning Activity      Home/SNF/Other Home  Chief Complaint Shortness of Breath; Leg Pain  Level of Care/Admitting Diagnosis ED Disposition    ED Disposition Condition [place]: [place] [[id number]]  Level of Care: Telemetry [5]  Admit to tele based on following criteria: Monitor for Ischemic changes  Covid Evaluation: Asymptomatic Screening Protocol (No Symptoms)  Diagnosis: COPD exacerbation [facility]) [[id number]]  Admitting Physician: Reubin Milan [[id number]]  Attending Physician: Reubin Milan [phone]  PT Class (Do Not Modify): Observation [104]  PT Acc Code (Do Not Modify): Observation [10022]       Medical History Past Medical History:  Diagnosis Date  . Atrophic vaginitis   . COPD (chronic obstructive pulmonary disease)  ([place])   . COPD, mild ([place])   . Hemorrhoids   . Hypertension   . Microscopic hematuria   . Osteopenia     Allergies Allergies  Allergen Reactions  . Sulfa Antibiotics Hives and Rash    IV Location/Drains/Wounds Patient Lines/Drains/Airways Status   Active Line/Drains/Airways    Name:   Placement date:   Placement time:   Site:   Days:   Peripheral IV 11/06/18 Left Antecubital   11/06/18    1839    Antecubital   1          Labs/Imaging Results for orders placed or performed during the hospital encounter of 11/06/18 (from the past 48 hour(s))  CBC     Status: None   Collection Time: 11/06/18  6:16 PM  Result Value Ref Range   WBC 9.9 4.0 - 10.5 K/uL   RBC 4.25 3.87 - 5.11 MIL/uL   Hemoglobin 13.1 12.0 - 15.0 g/dL   HCT 40.6 36.0 - 46.0 %   MCV 95.5 80.0 - 100.0 fL   MCH 30.8 26.0 - 34.0 pg   MCHC 32.3 30.0 - 36.0 g/dL   RDW 14.2 11.5 - 15.5 %   Platelets 246 150 - 400 K/uL   nRBC 0.0 0.0 - 0.2 %    Comment: Performed at [facility], [place] [address] [place], [place] [address]  Basic metabolic panel     Status:  Abnormal   Collection Time: 11/06/18  6:16 PM  Result Value Ref Range   Sodium 137 135 - 145 mmol/L   Potassium 4.3 3.5 - 5.1 mmol/L   Chloride 98 98 - 111 mmol/L   CO2 28 22 - 32 mmol/L   Glucose, Bld 120 (H) 70 - 99 mg/dL   BUN 21 8 - 23 mg/dL   Creatinine, Ser 0.79 0.44 - 1.00 mg/dL   Calcium 9.4 8.9 - 10.3 mg/dL   GFR calc non Af Amer >60 >60 mL/min   GFR calc Af Amer >60 >60 mL/min   Anion gap 11 5 - 15    Comment: Performed at [facility], [place] [address], [place], [place] [address]  Magnesium     Status: None   Collection Time: 11/06/18  6:16 PM  Result Value Ref Range   Magnesium 2.4 1.7 - 2.4 mg/dL    Comment: Performed at [facility], [place] [address] [place], [place] [address]  Phosphorus     Status: None   Collection Time: 11/06/18  6:16 PM  Result Value Ref Range   Phosphorus 4.6  2.5 - 4.6 mg/dL    Comment: Performed at [facility], [place] [address] [place], [place] [address]  Urinalysis, Routine w reflex microscopic     Status: Abnormal   Collection Time: 11/06/18  6:34 PM  Result Value Ref Range   Color, Urine YELLOW YELLOW   APPearance HAZY (A) CLEAR   Specific Gravity, Urine 1.017 1.005 - 1.030   pH 6.0 5.0 - 8.0   Glucose, UA NEGATIVE NEGATIVE mg/dL   Hgb urine dipstick SMALL (A) NEGATIVE   Bilirubin Urine NEGATIVE NEGATIVE   Ketones, ur NEGATIVE NEGATIVE mg/dL   Protein, ur NEGATIVE NEGATIVE mg/dL   Nitrite NEGATIVE NEGATIVE   Leukocytes,Ua MODERATE (A) NEGATIVE   RBC / HPF 21-50 0 - 5 RBC/hpf   WBC, UA 11-20 0 - 5 WBC/hpf   Bacteria, UA NONE SEEN NONE SEEN   Squamous Epithelial / LPF 0-5 0 - 5   Mucus PRESENT    Hyaline Casts, UA PRESENT    Amorphous Crystal PRESENT     Comment: Performed at [facility], [place] [address] [place], [place] [address]  SARS Coronavirus 2 [facility] order, Performed in [facility] hospital lab) Nasopharyngeal Nasopharyngeal Swab     Status: None   Collection Time: 11/06/18 10:02 PM   Specimen: Nasopharyngeal Swab  Result Value Ref Range   SARS Coronavirus 2 NEGATIVE NEGATIVE    Comment: (NOTE) If result is NEGATIVE SARS-CoV-2 target nucleic acids are NOT DETECTED. The SARS-CoV-2 RNA is generally detectable in upper and lower  respiratory specimens during the acute phase of infection. The lowest  concentration of SARS-CoV-2 viral copies this assay can detect is 250  copies / mL. A negative result does not preclude SARS-CoV-2 infection  and should not be used as the sole basis for treatment or other  patient management decisions.  A negative result may occur with  improper specimen collection / handling, submission of specimen other  than nasopharyngeal swab, presence of viral mutation(s) within the  areas targeted by this assay, and inadequate number of viral copies  (<250 copies  / mL). A negative result must be combined with clinical  observations, patient history, and epidemiological information. If result is POSITIVE SARS-CoV-2 target nucleic acids are DETECTED. The SARS-CoV-2 RNA is generally detectable in upper and lower  respiratory specimens dur ing the acute  phase of infection.  Positive  results are indicative of active infection with SARS-CoV-2.  Clinical  correlation with patient history and other diagnostic information is  necessary to determine patient infection status.  Positive results do  not rule out bacterial infection or co-infection with other viruses. If result is PRESUMPTIVE POSTIVE SARS-CoV-2 nucleic acids MAY BE PRESENT.   A presumptive positive result was obtained on the submitted specimen  and confirmed on repeat testing.  While 2019 novel coronavirus  (SARS-CoV-2) nucleic acids may be present in the submitted sample  additional confirmatory testing may be necessary for epidemiological  and / or clinical management purposes  to differentiate between  SARS-CoV-2 and other Sarbecovirus currently known to infect humans.  If clinically indicated additional testing with an alternate test  methodology [phone]) is advised. The SARS-CoV-2 RNA is generally  detectable in upper and lower respiratory sp ecimens during the acute  phase of infection. The expected result is Negative. Fact Sheet for Patients:  StrictlyIdeas.no Fact Sheet for Healthcare Providers: BankingDealers.co.za This test is not yet approved or cleared by the [place] FDA and has been authorized for detection and/or diagnosis of SARS-CoV-2 by FDA under an Emergency Use Authorization (EUA).  This EUA will remain in effect (meaning this test can be used) for the duration of the COVID-19 declaration under Section 564(b)(1) of the Act, 21 U.S.C. section 360bbb-3(b)(1), unless the authorization is terminated or revoked  sooner. Performed at [facility], [place] [address] [place], [place] [address]    Ct Lumbar Spine Wo Contrast  Result Date: 11/06/2018 CLINICAL DATA:  Initial evaluation for left lower extremity pain for 2 weeks. EXAM: CT LUMBAR SPINE WITHOUT CONTRAST TECHNIQUE: Multidetector CT imaging of the lumbar spine was performed without intravenous contrast administration. Multiplanar CT image reconstructions were also generated. COMPARISON:  None available. FINDINGS: Segmentation: Transitional lumbosacral anatomy. Lowest rib-bearing vertebral body labeled T12, and there is sacralization of the L5 vertebral body. L5-S1 disc is vestigial and rudimentary. Alignment: Trace dextroscoliosis with straightening of the normal lumbar lordosis. No listhesis or malalignment. Vertebrae: Vertebral body height maintained without evidence for acute or chronic fracture. Visualized sacrum and pelvis intact. SI joints approximated symmetric. No discrete lytic or blastic osseous lesions. Paraspinal and other soft tissues: Paraspinous soft tissues demonstrate no acute finding. Advanced aorto bi-iliac atherosclerotic disease. No visible aneurysm. Remainder the visualized visceral structures otherwise unremarkable. Disc levels: T12-L1: Chronic intervertebral disc space narrowing with mild diffuse disc bulge and disc desiccation. Reactive endplate changes with marginal endplate osteophytic spurring. Mild facet hypertrophy. No significant spinal stenosis. Mild right with moderate left foraminal narrowing. L1-2: Chronic intervertebral disc space narrowing with diffuse disc bulge and disc desiccation. Reactive endplate changes with marginal endplate osteophytic spurring. Moderate facet hypertrophy, slightly greater on the left. No significant spinal stenosis. Moderate left worse than right L1 foraminal stenosis. L2-3: Diffuse disc bulge with intervertebral disc space narrowing. Mild reactive endplate changes. Moderate facet  hypertrophy. Resultant mild spinal stenosis. Moderate bilateral L2 foraminal narrowing. L3-4: Chronic intervertebral disc space narrowing with disc desiccation and diffuse disc bulge. Moderate facet and ligament flavum hypertrophy. Resultant moderate spinal stenosis. Moderate bilateral L3 foraminal narrowing, slightly worse on the right. L4-5: Diffuse disc bulge with disc desiccation and intervertebral disc space narrowing. Reactive endplate changes. Moderate to advanced right worse than left facet hypertrophy. Resultant severe bilateral lateral recess stenosis, slightly worse on the right. Severe right with moderate left L4 foraminal narrowing. L5-S1: Transitional lumbosacral anatomy with sacralization of the L5  vertebral body and rudimentary L5-S1 interspace. No significant disc bulge. Mild bilateral facet hypertrophy. No stenosis or impingement. IMPRESSION: 1. No CT evidence for acute abnormality within the lumbar spine. 2. Moderate multilevel degenerative spondylolysis with resultant mild to moderate spinal stenosis at L2-3 and L3-4, with severe bilateral lateral recess stenosis at L4-5. 3. Multifactorial degenerative changes with resultant moderate to advanced multilevel foraminal narrowing as above, most notable at L4-5. 4. Transitional lumbosacral anatomy. 5. Advanced aorto bi-iliac atherosclerotic disease. Electronically Signed   By: Jeannine Boga M.D.   On: 11/06/2018 21:59   Ct Pelvis Wo Contrast  Result Date: 11/06/2018 CLINICAL DATA:  Left leg pain for 2 weeks.  No known injury. EXAM: CT OF THE PELVIS EXTREMITY WITHOUT CONTRAST TECHNIQUE: Multidetector CT imaging of the pelvis was performed according to the standard protocol. COMPARISON:  Plain films left hip earlier today. FINDINGS: Bones/Joint/Cartilage No acute bony or joint abnormality is identified. Moderate to moderately severe bilateral hip osteoarthritis is seen is worse on the left. Lower lumbar degenerative disc disease is noted.  There is also some degenerative change about the SI joints, worse on the left Ligaments Suboptimally assessed by CT. Muscles and Tendons Appear normal. Soft tissues No acute abnormality is identified. A left lower quadrant hernia contains loops of bowel without obstruction or other complicating feature. There is a small fat containing midline ventral hernia. Small fat containing inguinal hernias are seen. Atherosclerosis is noted. IMPRESSION: No acute abnormality. Left worse than right hip osteoarthritis. Lower lumbar degenerative change also noted. Atherosclerosis. Left lower quadrant hernia contains loops of bowel without complicating feature. Small fat containing inguinal hernias and midline ventral hernia are also seen. Electronically Signed   By: Inge Rise M.D.   On: 11/06/2018 21:50   Dg Chest Portable 1 View  Result Date: 11/06/2018 CLINICAL DATA:  Shortness of breath. EXAM: PORTABLE CHEST 1 VIEW COMPARISON:  October 16, 2017 FINDINGS: Cardiomediastinal silhouette is normal. Mediastinal contours appear intact. Calcific atherosclerotic disease of the aorta. There is no evidence of focal airspace consolidation, pleural effusion or pneumothorax. Osseous structures are without acute abnormality. Soft tissues are grossly normal. IMPRESSION: No active disease. Electronically Signed   By: Fidela Salisbury M.D.   On: 11/06/2018 19:27   Dg Hip Unilat W Or Wo Pelvis 2-3 Views Left  Result Date: 11/06/2018 CLINICAL DATA:  Pain, left leg pain for 2 weeks. EXAM: DG HIP (WITH OR WITHOUT PELVIS) 2-3V LEFT COMPARISON:  Right hip radiographs November 13, 2003 FINDINGS: Diffuse osteopenia may limit detection of small, nondisplaced fractures. No visible fracture or traumatic malalignment is evident. The femoral heads remain normally located. No proximal femoral fractures are identified. There are moderate hypertrophic osteoarthrosis features in both hips as well as at the SI joints and symphysis pubis. The sacrum  is partially obscured by overlying bowel gas. Discogenic and facet degenerative changes are present in the lower lumbar spine most pronounced at the lumbosacral junction. Gaseous distention of what appears to be the stomach. Remainder of the bowel gas pattern is unremarkable. Soft tissues are otherwise free of acute abnormality. IMPRESSION: 1. Diffuse osteopenia may limit detection of small, nondisplaced fractures. No visible fracture or traumatic malalignment. 2. Moderate degenerative changes of the hips and SI joints. Electronically Signed   By: Lovena Le M.D.   On: 11/06/2018 19:25    Pending Labs Unresulted Labs (From admission, onward)    Start     Ordered   11/13/18 0500  Creatinine, serum  (enoxaparin (LOVENOX)  CrCl >/= 30 ml/min)  Weekly,   R    Comments: while on enoxaparin therapy    11/06/18 2217          Vitals/Pain Today's Vitals   11/06/18 2000 11/06/18 2100 11/06/18 2246 11/06/18 2300  BP: (!) 147/79 (!) 146/75  (!) 153/58  Pulse: (!) 111 (!) 107  (!) 105  Resp: (!) 32 (!) 28  (!) 37  Temp:      TempSrc:      SpO2: 96% 91%  97%  PainSc:   5      Isolation Precautions No active isolations  Medications Medications  acetaminophen (TYLENOL) tablet 650 mg (has no administration in time range)    Or  acetaminophen (TYLENOL) suppository 650 mg (has no administration in time range)  methylPREDNISolone sodium succinate (SOLU-MEDROL) 40 mg/mL injection 40 mg (has no administration in time range)    Followed by  predniSONE (DELTASONE) tablet 40 mg (has no administration in time range)  enoxaparin (LOVENOX) injection 30 mg (has no administration in time range)  morphine 4 MG/ML injection 4 mg (4 mg Intravenous Given 11/06/18 1832)  albuterol (VENTOLIN HFA) 108 (90 Base) MCG/ACT inhaler 8 puff (8 puffs Inhalation Given 11/06/18 1821)  morphine 4 MG/ML injection 4 mg (4 mg Intravenous Given 11/06/18 2040)  methylPREDNISolone sodium succinate (SOLU-MEDROL) 125 mg/2 mL  injection 125 mg (125 mg Intravenous Given 11/06/18 2216)  magnesium sulfate IVPB 2 g 50 mL ( Intravenous Rate/Dose Verify 11/06/18 2316)    Mobility walks

## 2018-11-08 LAB — CBC
HCT: 38.3 % (ref 36.0–46.0)
Hemoglobin: 12.2 g/dL (ref 12.0–15.0)
MCH: 30.7 pg (ref 26.0–34.0)
MCHC: 31.9 g/dL (ref 30.0–36.0)
MCV: 96.5 fL (ref 80.0–100.0)
Platelets: 211 10*3/uL (ref 150–400)
RBC: 3.97 MIL/uL (ref 3.87–5.11)
RDW: 14 % (ref 11.5–15.5)
WBC: 9.7 10*3/uL (ref 4.0–10.5)
nRBC: 0 % (ref 0.0–0.2)

## 2018-11-08 LAB — BASIC METABOLIC PANEL
Anion gap: 12 (ref 5–15)
BUN: 19 mg/dL (ref 8–23)
CO2: 25 mmol/L (ref 22–32)
Calcium: 8.8 mg/dL — ABNORMAL LOW (ref 8.9–10.3)
Chloride: 97 mmol/L — ABNORMAL LOW (ref 98–111)
Creatinine, Ser: 0.59 mg/dL (ref 0.44–1.00)
GFR calc Af Amer: >60 mL/min (ref >60)
GFR calc non Af Amer: >60 mL/min (ref >60)
Glucose, Bld: 141 mg/dL — ABNORMAL HIGH (ref 70–99)
Potassium: 4.5 mmol/L (ref 3.5–5.1)
Sodium: 134 mmol/L — ABNORMAL LOW (ref 135–145)

## 2018-11-08 LAB — URINE CULTURE: Culture: <10000 — AB

## 2018-11-08 MED ORDER — ENOXAPARIN SODIUM 40 MG/0.4ML ~~LOC~~ SOLN
40.0000 mg | Freq: Every day | SUBCUTANEOUS | Status: DC
  Administered 2018-11-09: 10:00:00 40 mg via SUBCUTANEOUS
  Filled 2018-11-08: qty 0.4

## 2018-11-08 MED ORDER — TRAMADOL HCL 50 MG PO TABS
50.0000 mg | ORAL_TABLET | Freq: Three times a day (TID) | ORAL | Status: DC | PRN
  Administered 2018-11-08 – 2018-11-09 (×2): 50 mg via ORAL
  Filled 2018-11-08 (×2): qty 1

## 2018-11-08 MED ORDER — AZITHROMYCIN 250 MG PO TABS
250.0000 mg | ORAL_TABLET | Freq: Every day | ORAL | Status: DC
  Administered 2018-11-09: 250 mg via ORAL
  Filled 2018-11-08: qty 1

## 2018-11-08 NOTE — Evaluation (Signed)
Occupational Therapy Evaluation Patient Details Name: Sierra [place] MRN: [id number] DOB: May 23, 1928 Today's Date: 11/08/2018    History of Present Illness [place] is a 85 y.o. female with medical history significant of hypertension, microscopic hematuria, osteopenia, end-stage COPD who presented to the ED due to left lower extremity pain, particularly on the hip and knee.   Clinical Impression   Pt admitted with LLE pain. Pt currently with functional limitations due to the deficits listed below (see OT Problem List).  Pt will benefit from skilled OT to increase their safety and independence with ADL and functional mobility for ADL to facilitate discharge to venue listed below.      Follow Up Recommendations  Home health OT;Supervision/Assistance - 24 hour;Other (comment)(24/7 S or ALF)    Equipment Recommendations  None recommended by OT    Recommendations for Other Services       Precautions / Restrictions Precautions Precautions: Fall      Mobility Bed Mobility               General bed mobility comments: pt sitting EOB  Transfers Overall transfer level: Needs assistance Equipment used: Rolling walker (2 wheeled) Transfers: Sit to/from [facility] Sit to Stand: Min assist Stand pivot transfers: Min assist       General transfer comment: pt with decreased safety awareness during OT session with fall risk and walker safety    Balance Overall balance assessment: Needs assistance Sitting-balance support: Feet supported;No upper extremity supported Sitting balance-Leahy Scale: Fair Sitting balance - Comments: pt used grab bars for dynamic sitting activities     Standing balance-Leahy Scale: Fair Standing balance comment: pt can perform static standing without support however requires external support with UE's for dynamic standing and gait                           ADL either performed or assessed with clinical judgement   ADL  Overall ADL's : Needs assistance/impaired Eating/Feeding: Set up;Sitting   Grooming: Set up;Sitting   Upper Body Bathing: Set up;Sitting   Lower Body Bathing: Moderate assistance;Cueing for safety;Cueing for sequencing;Sit to/from stand   Upper Body Dressing : Set up;Sitting   Lower Body Dressing: Moderate assistance;Sit to/from stand;Cueing for safety;Cueing for sequencing   Toilet Transfer: Minimal assistance;Cueing for safety;Cueing for sequencing;BSC   Toileting- Clothing Manipulation and Hygiene: Minimal assistance;Sit to/from stand;Cueing for safety       Functional mobility during ADLs: Minimal assistance;Rolling walker;Cueing for sequencing;Cueing for safety General ADL Comments: OT called daugher and explained pt will need increased A at home with ADL activity or consider an ALF.  Pt fatigued quickly and was confused at times     Vision Patient Visual Report: No change from baseline              Pertinent Vitals/Pain Pain Assessment: 0-10 Pain Score: 3  Pain Location: L LE Pain Descriptors / Indicators: Sore;Discomfort Pain Intervention(s): Limited activity within patient's tolerance;Repositioned     Hand Dominance Right   Extremity/Trunk Assessment Upper Extremity Assessment Upper Extremity Assessment: Generalized weakness       Cervical / Trunk Assessment Cervical / Trunk Assessment: Kyphotic   Communication Communication Communication: No difficulties   Cognition Arousal/Alertness: Awake/alert Behavior During Therapy: WFL for tasks assessed/performed Overall Cognitive Status: Impaired/Different from baseline Area of Impairment: Safety/judgement  Safety/Judgement: Decreased awareness of safety     General Comments: OT called pts daughter who also felt pt was confused after speaking to her on the phone   General [place] expects to be discharged to:: Private  residence Living Arrangements: Alone Available Help at Discharge: Family;Available PRN/intermittently;Personal care attendant(pt reprots she has a hospice nurse who assists her and assesses her medically 1x/week. her son lives in [place] and her daughter lives in [place]) Type of Home: House Home Access: Stairs to enter Technical brewer of Steps: 4 Entrance Stairs-Rails: Left Home Layout: Two level;Able to live on main level with bedroom/bathroom Alternate Level Stairs-Number of Steps: pt does not go upstairs   Bathroom Shower/Tub: Occupational psychologist: Standard Bathroom Accessibility: Yes   Home Equipment: Shower seat - built in;Cane - single point;Grab bars - tub/shower   Additional Comments: pt is on O2 at home, she does not like to use the portable O2 tanks      Prior Functioning/Environment Level of Independence: Independent        Comments: pt reports she does not use assistive device for mobility and has been independent with all ADL's, she is still driving, she gets her groceries delivered to her house        OT Problem List: Decreased strength;Decreased activity tolerance;Impaired balance (sitting and/or standing);Decreased safety awareness      OT Treatment/Interventions: Self-care/ADL training;Patient/family education;DME and/or AE instruction;Energy conservation    OT Goals(Current goals can be found in the care plan section) Acute Rehab OT Goals Patient Stated Goal: to return home and not need as much oxygen support OT Goal Formulation: With patient Time For Goal Achievement: 11/15/18 Potential to Achieve Goals: Good  OT Frequency: Min 2X/week    AM-PAC OT "6 Clicks" Daily Activity     Outcome Measure Help from another person eating meals?: None Help from another person taking care of personal grooming?: A Little Help from another person toileting, which includes using toliet, bedpan, or urinal?: A Little Help from another person bathing  (including washing, rinsing, drying)?: A Little Help from another person to put on and taking off regular upper body clothing?: None Help from another person to put on and taking off regular lower body clothing?: A Little 6 Click Score: 20   End of Session Equipment Utilized During Treatment: Gait belt;Rolling walker Nurse Communication: Mobility status  Activity Tolerance: Patient limited by fatigue Patient left: in bed;with bed alarm set  OT Visit Diagnosis: Unsteadiness on feet (R26.81);Muscle weakness (generalized) (M62.81);History of falling (Z91.81)                Time: 5732-2025 OT Time Calculation (min): 46 min Charges:  OT General Charges $OT Visit: 1 Visit OT Evaluation $OT Eval Moderate Complexity: 1 Mod OT Treatments $Self Care/Home Management : 23-37 mins  Kari Baars, OT Acute Rehabilitation Services [phone] Office- [phone]     Shalice Woodring, Edwena Felty D 11/08/2018, 2:04 PM

## 2018-11-08 NOTE — TOC Progression Note (Signed)
Transition of Care [facility]) - Progression Note    Patient Details  Name: CARLEENA MIRES MRN: [id number] Date of Birth: Sep 02, 1928  Transition of [facility]) CM/SW Contact  Denney Shein, Juliann Pulse, RN Phone Number: 11/08/2018, 2:09 PM  Clinical Narrative: spoke to dtr-Jackie d/c plan-return back home w/[facility] care(patient wants home) rep Bevely Palmer following.dme rw, 3n1 to be delivered to patients home-will contact [facility] if they can take dme-await response. dtr Kennyth Lose getting 24hr private duty custodial level help in place.Per Elvis Co[facility] care-patient has lock key box, & patient has a key. Will transport back by [facility].           Expected Discharge Plan and Services                                                 Social Determinants of Health (SDOH) Interventions    Readmission Risk Interventions No flowsheet data found.

## 2018-11-08 NOTE — Progress Notes (Signed)
PROGRESS NOTE    Sierra Warner  VOZ:[id number] DOB: 1928-10-28 DOA: 11/06/2018 PCP: Patient, No Pcp Per    Brief Narrative:  HPI per Dr. Enis Slipper Sierra Warner is a 85 y.o. female with medical history significant of atrophic vaginitis, hemorrhoids, hypertension, microscopic hematuria, osteopenia end-stage COPD who came into the emergency department due to left lower extremity pain, particularly on the hip and knee, but radiating all the way down to her left foot.  She denies trauma to the area and has been taking azithromycin often with occasional tramadol.  She has been having wheezing and dyspnea.  She denies fever, chills, rhinorrhea, sore throat, hemoptysis.  No chest pain, palpitations, diaphoresis, PND, orthopnea or pitting edema of the lower extremities.  Denies abdominal pain, nausea or emesis, melena or hematochezia.  She gets frequent constipation.  No dysuria, frequency or hematuria.  Denies polyuria, polydipsia, polyphagia or blurred vision.s  ED Course: Initial vital signs temperature 98.2 F, pulse 94, respirations 25, blood pressure 154/68 mmHg and O2 sat 98% on room air.  Later in the evening the patient became tachypneic while in the emergency department.  She received bronchodilators along with supplemental oxygen and IV Solu-Medrol.  Her urinalysis had a hazy appearance, small hemoglobinuria and moderate leukocyte esterase.  CBC was normal.  BMP showed a glucose of 120 mg/dL but was otherwise unremarkable.    Imaging: chest radiograph did not show any active disease.  Hip x-ray showed moderate DJD changes.  CT pelvis no acute on normalities, but once again is show bilateral osteoarthritis.  There was a left lower quadrant hernia containing loops of bowel, but without a complicating feature.  There were small fat-containing inguinal hernias a midline ventral hernias please see images and full radiology report for further detail.   Assessment & Plan:   Principal Problem:   COPD  exacerbation ([place]) Active Problems:   Hypertension   GERD (gastroesophageal reflux disease)   Leg pain  1 acute exacerbation of end-stage COPD Patient noted on admission to be wheezing, with worsening shortness of breath.  Patient with end-stage COPD on home hospice.  Improving slowly.  IV Solu-Medrol transitioned to oral prednisone taper today.  Continue scheduled duo nebs, Claritin, Pulmicort, Flonase.  Continue IV Rocephin through today and transition to oral azithromycin tomorrow.  Follow.  2.  Left lower extremity pain Secondary to multi-joint osteoarthritis.  Continue scheduled Tylenol 500 mg 3 times daily.  Increase tramadol to 50 mg every 8 hours as needed.  PT/OT.  Supportive care.  3.  Hypertension Blood pressure stable.  Continue home regimen losartan.  Likely resume home regimen Lasix in the next 24 to 48 hours.  4.  Gastroesophageal reflux disease Continue PPI.   DVT prophylaxis: Lovenox Code Status: DNR Family Communication: Updated patient and daughter on telephone. Disposition Plan: Likely back home with hospice once hip pain is controlled and COPD exacerbation improved.   Consultants:   None  Procedures:   CT pelvis/CT L-spine 11/06/2018  Chest x-ray 11/06/2018  Plain films of the left hip and pelvis 11/06/2018  Antimicrobials:   IV Rocephin 11/07/2018>>>>> 11/08/2018  Azithromycin 11/09/2018   Subjective: Patient sitting up on the side of the bed.  Patient with some complaints of left lower extremity pain with ambulation with PT today.  Denies any chest pain.  Denies any significant worsening with her shortness of breath.  Objective: Vitals:   11/07/18 2038 11/08/18 0547 11/08/18 0714 11/08/18 0715  BP: (!) 145/72 133/61    Pulse:  98 90    Resp: (!) 22 18    Temp: 98.2 F (36.8 C) 98.2 F (36.8 C)    TempSrc: Oral Oral    SpO2: 98% 98% 95% 95%  Weight:      Height:        Intake/Output Summary (Last 24 hours) at 11/08/2018 1309 Last data filed  at 11/08/2018 0600 Gross per 24 hour  Intake 715.14 ml  Output 150 ml  Net 565.14 ml   Filed Weights   11/07/18 0044  Weight: 54 kg    Examination:  General exam: NAD Respiratory system: Fair air movement.  No wheezing noted.  No crackles.  No rhonchi.  Cardiovascular system: Regular rate rhythm no murmurs rubs or gallops.  No JVD.  No lower extremity edema.  Gastrointestinal system: Abdomen is soft, nondistended, nontender, positive bowel sounds.  No rebound.  No guarding.  Central nervous system: Alert and oriented. No focal neurological deficits. Extremities: Symmetric 5 x 5 power. Skin: No rashes, lesions or ulcers Psychiatry: Judgement and insight appear normal. Mood & affect appropriate.     Data Reviewed: I have personally reviewed following labs and imaging studies  CBC: Recent Labs  Lab 11/06/18 1816 11/08/18 0454  WBC 9.9 9.7  HGB 13.1 12.2  HCT 40.6 38.3  MCV 95.5 96.5  PLT 246 599   Basic Metabolic Panel: Recent Labs  Lab 11/06/18 1816 11/08/18 0454  NA 137 134*  K 4.3 4.5  CL 98 97*  CO2 28 25  GLUCOSE 120* 141*  BUN 21 19  CREATININE 0.79 0.59  CALCIUM 9.4 8.8*  MG 2.4  --   PHOS 4.6  --    GFR: Estimated Creatinine Clearance: 40.2 mL/min (by C-G formula based on SCr of 0.59 mg/dL). Liver Function Tests: No results for input(s): AST, ALT, ALKPHOS, BILITOT, PROT, ALBUMIN in the last 168 hours. No results for input(s): LIPASE, AMYLASE in the last 168 hours. No results for input(s): AMMONIA in the last 168 hours. Coagulation Profile: No results for input(s): INR, PROTIME in the last 168 hours. Cardiac Enzymes: No results for input(s): CKTOTAL, CKMB, CKMBINDEX, TROPONINI in the last 168 hours. BNP (last 3 results) No results for input(s): PROBNP in the last 8760 hours. HbA1C: No results for input(s): HGBA1C in the last 72 hours. CBG: No results for input(s): GLUCAP in the last 168 hours. Lipid Profile: No results for input(s): CHOL, HDL,  LDLCALC, TRIG, CHOLHDL, LDLDIRECT in the last 72 hours. Thyroid Function Tests: No results for input(s): TSH, T4TOTAL, FREET4, T3FREE, THYROIDAB in the last 72 hours. Anemia Panel: No results for input(s): VITAMINB12, FOLATE, FERRITIN, TIBC, IRON, RETICCTPCT in the last 72 hours. Sepsis Labs: No results for input(s): PROCALCITON, LATICACIDVEN in the last 168 hours.  Recent Results (from the past 240 hour(s))  SARS Coronavirus 2 [facility] order, Performed in [facility] hospital lab) Nasopharyngeal Nasopharyngeal Swab     Status: None   Collection Time: 11/06/18 10:02 PM   Specimen: Nasopharyngeal Swab  Result Value Ref Range Status   SARS Coronavirus 2 NEGATIVE NEGATIVE Final    Comment: (NOTE) If result is NEGATIVE SARS-CoV-2 target nucleic acids are NOT DETECTED. The SARS-CoV-2 RNA is generally detectable in upper and lower  respiratory specimens during the acute phase of infection. The lowest  concentration of SARS-CoV-2 viral copies this assay can detect is 250  copies / mL. A negative result does not preclude SARS-CoV-2 infection  and should not be used as the sole basis for treatment  or other  patient management decisions.  A negative result may occur with  improper specimen collection / handling, submission of specimen other  than nasopharyngeal swab, presence of viral mutation(s) within the  areas targeted by this assay, and inadequate number of viral copies  (<250 copies / mL). A negative result must be combined with clinical  observations, patient history, and epidemiological information. If result is POSITIVE SARS-CoV-2 target nucleic acids are DETECTED. The SARS-CoV-2 RNA is generally detectable in upper and lower  respiratory specimens dur ing the acute phase of infection.  Positive  results are indicative of active infection with SARS-CoV-2.  Clinical  correlation with patient history and other diagnostic information is  necessary to determine patient infection  status.  Positive results do  not rule out bacterial infection or co-infection with other viruses. If result is PRESUMPTIVE POSTIVE SARS-CoV-2 nucleic acids MAY BE PRESENT.   A presumptive positive result was obtained on the submitted specimen  and confirmed on repeat testing.  While 2019 novel coronavirus  (SARS-CoV-2) nucleic acids may be present in the submitted sample  additional confirmatory testing may be necessary for epidemiological  and / or clinical management purposes  to differentiate between  SARS-CoV-2 and other Sarbecovirus currently known to infect humans.  If clinically indicated additional testing with an alternate test  methodology [phone]) is advised. The SARS-CoV-2 RNA is generally  detectable in upper and lower respiratory sp ecimens during the acute  phase of infection. The expected result is Negative. Fact Sheet for Patients:  StrictlyIdeas.no Fact Sheet for Healthcare Providers: BankingDealers.co.za This test is not yet approved or cleared by the [place] FDA and has been authorized for detection and/or diagnosis of SARS-CoV-2 by FDA under an Emergency Use Authorization (EUA).  This EUA will remain in effect (meaning this test can be used) for the duration of the COVID-19 declaration under Section 564(b)(1) of the Act, 21 U.S.C. section 360bbb-3(b)(1), unless the authorization is terminated or revoked sooner. Performed at [facility], [place] [address], [place], [place] [address]   Culture, Urine     Status: Abnormal   Collection Time: 11/07/18 12:09 PM   Specimen: Urine, Random  Result Value Ref Range Status   Specimen Description   Final    URINE, RANDOM Performed at [facility] [address] [place], [place] [address]    Special Requests   Final    NONE Performed at [facility], [place] [address], [place], [place] [address]    Culture (A)   Final    <10,000 COLONIES/mL INSIGNIFICANT GROWTH Performed at [facility] [address] [place],  [address]    Report Status 11/08/2018 FINAL  Final         Radiology Studies: Ct Lumbar Spine Wo Contrast  Result Date: 11/06/2018 CLINICAL DATA:  Initial evaluation for left lower extremity pain for 2 weeks. EXAM: CT LUMBAR SPINE WITHOUT CONTRAST TECHNIQUE: Multidetector CT imaging of the lumbar spine was performed without intravenous contrast administration. Multiplanar CT image reconstructions were also generated. COMPARISON:  None available. FINDINGS: Segmentation: Transitional lumbosacral anatomy. Lowest rib-bearing vertebral body labeled T12, and there is sacralization of the L5 vertebral body. L5-S1 disc is vestigial and rudimentary. Alignment: Trace dextroscoliosis with straightening of the normal lumbar lordosis. No listhesis or malalignment. Vertebrae: Vertebral body height maintained without evidence for acute or chronic fracture. Visualized sacrum and pelvis intact. SI joints approximated symmetric. No discrete lytic or blastic osseous lesions. Paraspinal and other  soft tissues: Paraspinous soft tissues demonstrate no acute finding. Advanced aorto bi-iliac atherosclerotic disease. No visible aneurysm. Remainder the visualized visceral structures otherwise unremarkable. Disc levels: T12-L1: Chronic intervertebral disc space narrowing with mild diffuse disc bulge and disc desiccation. Reactive endplate changes with marginal endplate osteophytic spurring. Mild facet hypertrophy. No significant spinal stenosis. Mild right with moderate left foraminal narrowing. L1-2: Chronic intervertebral disc space narrowing with diffuse disc bulge and disc desiccation. Reactive endplate changes with marginal endplate osteophytic spurring. Moderate facet hypertrophy, slightly greater on the left. No significant spinal stenosis. Moderate left worse than right L1 foraminal stenosis. L2-3: Diffuse  disc bulge with intervertebral disc space narrowing. Mild reactive endplate changes. Moderate facet hypertrophy. Resultant mild spinal stenosis. Moderate bilateral L2 foraminal narrowing. L3-4: Chronic intervertebral disc space narrowing with disc desiccation and diffuse disc bulge. Moderate facet and ligament flavum hypertrophy. Resultant moderate spinal stenosis. Moderate bilateral L3 foraminal narrowing, slightly worse on the right. L4-5: Diffuse disc bulge with disc desiccation and intervertebral disc space narrowing. Reactive endplate changes. Moderate to advanced right worse than left facet hypertrophy. Resultant severe bilateral lateral recess stenosis, slightly worse on the right. Severe right with moderate left L4 foraminal narrowing. L5-S1: Transitional lumbosacral anatomy with sacralization of the L5 vertebral body and rudimentary L5-S1 interspace. No significant disc bulge. Mild bilateral facet hypertrophy. No stenosis or impingement. IMPRESSION: 1. No CT evidence for acute abnormality within the lumbar spine. 2. Moderate multilevel degenerative spondylolysis with resultant mild to moderate spinal stenosis at L2-3 and L3-4, with severe bilateral lateral recess stenosis at L4-5. 3. Multifactorial degenerative changes with resultant moderate to advanced multilevel foraminal narrowing as above, most notable at L4-5. 4. Transitional lumbosacral anatomy. 5. Advanced aorto bi-iliac atherosclerotic disease. Electronically Signed   By: Jeannine Boga M.D.   On: 11/06/2018 21:59   Ct Pelvis Wo Contrast  Result Date: 11/06/2018 CLINICAL DATA:  Left leg pain for 2 weeks.  No known injury. EXAM: CT OF THE PELVIS EXTREMITY WITHOUT CONTRAST TECHNIQUE: Multidetector CT imaging of the pelvis was performed according to the standard protocol. COMPARISON:  Plain films left hip earlier today. FINDINGS: Bones/Joint/Cartilage No acute bony or joint abnormality is identified. Moderate to moderately severe bilateral  hip osteoarthritis is seen is worse on the left. Lower lumbar degenerative disc disease is noted. There is also some degenerative change about the SI joints, worse on the left Ligaments Suboptimally assessed by CT. Muscles and Tendons Appear normal. Soft tissues No acute abnormality is identified. A left lower quadrant hernia contains loops of bowel without obstruction or other complicating feature. There is a small fat containing midline ventral hernia. Small fat containing inguinal hernias are seen. Atherosclerosis is noted. IMPRESSION: No acute abnormality. Left worse than right hip osteoarthritis. Lower lumbar degenerative change also noted. Atherosclerosis. Left lower quadrant hernia contains loops of bowel without complicating feature. Small fat containing inguinal hernias and midline ventral hernia are also seen. Electronically Signed   By: Inge Rise M.D.   On: 11/06/2018 21:50   Dg Chest Portable 1 View  Result Date: 11/06/2018 CLINICAL DATA:  Shortness of breath. EXAM: PORTABLE CHEST 1 VIEW COMPARISON:  October 16, 2017 FINDINGS: Cardiomediastinal silhouette is normal. Mediastinal contours appear intact. Calcific atherosclerotic disease of the aorta. There is no evidence of focal airspace consolidation, pleural effusion or pneumothorax. Osseous structures are without acute abnormality. Soft tissues are grossly normal. IMPRESSION: No active disease. Electronically Signed   By: Fidela Salisbury M.D.   On: 11/06/2018 19:27   Dg  Hip Unilat W Or Wo Pelvis 2-3 Views Left  Result Date: 11/06/2018 CLINICAL DATA:  Pain, left leg pain for 2 weeks. EXAM: DG HIP (WITH OR WITHOUT PELVIS) 2-3V LEFT COMPARISON:  Right hip radiographs November 13, 2003 FINDINGS: Diffuse osteopenia may limit detection of small, nondisplaced fractures. No visible fracture or traumatic malalignment is evident. The femoral heads remain normally located. No proximal femoral fractures are identified. There are moderate hypertrophic  osteoarthrosis features in both hips as well as at the SI joints and symphysis pubis. The sacrum is partially obscured by overlying bowel gas. Discogenic and facet degenerative changes are present in the lower lumbar spine most pronounced at the lumbosacral junction. Gaseous distention of what appears to be the stomach. Remainder of the bowel gas pattern is unremarkable. Soft tissues are otherwise free of acute abnormality. IMPRESSION: 1. Diffuse osteopenia may limit detection of small, nondisplaced fractures. No visible fracture or traumatic malalignment. 2. Moderate degenerative changes of the hips and SI joints. Electronically Signed   By: Lovena Le M.D.   On: 11/06/2018 19:25        Scheduled Meds:  acetaminophen  500 mg Oral TID   budesonide (PULMICORT) nebulizer solution  0.5 mg Nebulization BID   enoxaparin (LOVENOX) injection  30 mg Subcutaneous Daily   feeding supplement (ENSURE ENLIVE)  237 mL Oral BID BM   fluticasone  2 spray Each Nare Daily   guaiFENesin  600 mg Oral BID   ipratropium-albuterol  3 mL Nebulization TID   loratadine  10 mg Oral Daily   losartan  100 mg Oral Daily   pantoprazole  40 mg Oral Daily   polyethylene glycol  17 g Oral Daily   predniSONE  40 mg Oral Q breakfast   Continuous Infusions:  sodium chloride 250 mL (11/07/18 1116)   cefTRIAXone (ROCEPHIN)  IV 1 g (11/08/18 1047)     LOS: 1 day    Time spent: 35 minutes    Sierra Seal, MD [facility]  If 7PM-7AM, please contact night-coverage [email or web address] 11/08/2018, 1:09 PM

## 2018-11-08 NOTE — Evaluation (Signed)
Clinical/Bedside Swallow Evaluation Patient Details  Name: Sierra Warner MRN: [id number] Date of Birth: 1928/05/08  Today's Date: 11/08/2018 Time: SLP Start Time (ACUTE ONLY): 7371 SLP Stop Time (ACUTE ONLY): 1108 SLP Time Calculation (min) (ACUTE ONLY): 21 min  Past Medical History:  Past Medical History:  Diagnosis Date  . Atrophic vaginitis   . COPD (chronic obstructive pulmonary disease) ([place])   . COPD, mild ([place])   . Hemorrhoids   . Hypertension   . Microscopic hematuria   . Osteopenia    Past Surgical History:  Past Surgical History:  Procedure Laterality Date  . APPENDECTOMY  1933  . CATARACT SURGERY    . CYSTOSCOPY    . THYROIDECTOMY, PARTIAL    . TONSILLECTOMY     HPI:  85 yo female adm to [facility] with COPD exacerbation.  Pt in on hospice prior to admission.   Pt recently hit her leg on a car door with wound that is difficult to heal. She was being seen by a wound clinic.  Pt also with h/o GERD, severe COPD- Stage IV, HTN, varicose veins.   Assessment / Plan / Recommendation Clinical Impression  No focal CN deficits noted but pt observed with elevated RR which will impact reciprocity of swallow/respiration and may lead to episodic aspiration.  Observed pt consuming pudding, applesauce, Ensure and water.  She demonstrates frequent belching and coughing most notably after pudding intake.  She demonstrates poor awareness to her dysphagia - stating she has problems with secretions, mucus but later admitted it may be a little bit of pudding.   Consumption of water clinically facilitated clearance of resdiuals or "secretions".  Also suspect milk products may coat more and increase secretions.    SLP provided pt compensation strategies to increase comfort with eating verbally and in writing. Reviewed options for medication administration.  She informed SlP that she was receiving "too much information".  Information provided in writing for her to read and share with her family.     Recommend soft/thin = prefer water with meals with precautions.  Episodic aspiration likely common however mitigation strategies reviewed.  Thanks for the consult for this patient.    SLP Visit Diagnosis: Dysphagia, unspecified (R13.10)    Aspiration Risk  Moderate aspiration risk    Diet Recommendation Thin liquid(soft foods)   Liquid Administration via: Cup;Straw Medication Administration: (as tolerated, can take with pudding, applesauce etc) Compensations: Slow rate;Small sips/bites;Other (Comment)(rest if short of breath) Postural Changes: Seated upright at 90 degrees;Remain upright for at least 30 minutes after po intake    Other  Recommendations Oral Care Recommendations: Oral care QID   Follow up Recommendations None      Frequency and Duration            Prognosis        Swallow Study   General Date of Onset: 11/08/18 HPI: 85 yo female adm to [facility] with COPD exacerbation.  Pt in on hospice prior to admission.   Pt recently hit her leg on a car door with wound that is difficult to heal. She was being seen by a wound clinic.  Pt also with h/o GERD, severe COPD- Stage IV, HTN, varicose veins. Type of Study: Bedside Swallow Evaluation Diet Prior to this Study: Regular;Thin liquids Temperature Spikes Noted: No Respiratory Status: Nasal cannula History of Recent Intubation: No Behavior/Cognition: Alert;Impulsive;Other (Comment)(pt becomes frustrated easily) Oral Cavity Assessment: Within Functional Limits Oral Care Completed by SLP: No Oral Cavity - Dentition: Other (Comment)(present) Vision: Functional  for self-feeding Self-Feeding Abilities: Able to feed self Patient Positioning: Upright in bed(sitting at edge of bed) Baseline Vocal Quality: Suspected CN X (Vagus) involvement;Low vocal intensity Volitional Cough: Strong Volitional Swallow: Able to elicit    Oral/Motor/Sensory Function Overall Oral Motor/Sensory Function: Within functional limits   Ice Chips Ice  chips: Not tested   Thin Liquid Thin Liquid: Within functional limits Presentation: Self Fed;Straw    Nectar Thick Nectar Thick Liquid: Impaired Presentation: Straw Pharyngeal Phase Impairments: Throat Clearing - Immediate Other Comments: delayed throat clearing, pt reports due to mucus - ? if may be some Ensure residuals   Honey Thick Honey Thick Liquid: Not tested   Puree Puree: Impaired Presentation: Self Fed;Spoon Pharyngeal Phase Impairments: Cough - Delayed;Other (comments)(belching) Other Comments: consuming thin water after faciliated clearance   Solid     Solid: Not tested      Macario Golds 11/08/2018,1:51 PM    Luanna Salk, [facility] SLP Acute Rehab Services Pager [phone] Office [phone]

## 2018-11-08 NOTE — Progress Notes (Signed)
BSE completed, full report to follow.  Pt easily becomes stressed raising voice during session.  Observed pt consuming pudding, applesauce, Ensure and water.  She demonstrates frequent belching and coughing most notably after pudding intake.  She demonstrates poor awareness to her dysphagia - stating she has problems with secretions, mucus.  SLP provided pt to compensation strategies to increase comfort with eating.  Information provided in writing for her to read and share with her family.  Recommend soft/thin = prefer water with meals.    Luanna Salk, [place] [facility] SLP Acute Rehab Services Pager [phone] Office [phone]

## 2018-11-08 NOTE — TOC Progression Note (Signed)
Transition of Care [facility]) - Progression Note    Patient Details  Name: Sierra Warner MRN: [id number] Date of Birth: [birth year]  Transition of [facility]) CM/SW Contact  Andjela Wickes, Juliann Pulse, RN Phone Number: 11/08/2018, 2:20 PM  Clinical Narrative: Charisse Klinefelter will bel able to transport dme in vehicle spoke to [employer]. Awaiting dtr to get 24hr care private duty set up.d/c plan home w/authora care.           Expected Discharge Plan and Services                                                 Social Determinants of Health (SDOH) Interventions    Readmission Risk Interventions No flowsheet data found.

## 2018-11-08 NOTE — Plan of Care (Signed)
  Problem: Education: Goal: Knowledge of General Education information will improve Description: Including pain rating scale, medication(s)/side effects and non-pharmacologic comfort measures Outcome: Progressing   Problem: Coping: Goal: Level of anxiety will decrease Outcome: Not Progressing   Problem: Clinical Measurements: Goal: Will remain free from infection Outcome: Adequate for Discharge Goal: Diagnostic test results will improve Outcome: Adequate for Discharge Goal: Respiratory complications will improve Outcome: Adequate for Discharge Goal: Cardiovascular complication will be avoided Outcome: Adequate for Discharge

## 2018-11-08 NOTE — Progress Notes (Signed)
WL 1403 [facility] ([facility]) GIP RN note.  This is a related and covered hospital admission.  Sierra Warner is under hospice services with a diagnosis of COPD per Dr. Karie Georges.  Sierra Warner has been complaining of severe lower abdominal/leg (left groin) area pain for a few weeks.  Yesterday, it was intolerable and she had a visit by her home care hospice RN.  There was a palpable mass at her left groin. Her pain was not controlled with tylenol or tramadol.  She was more confused yesterday than normal.  The decision was made to have her evaluated at the hospital to help with pain control.  She is admitted with leg pain.  Received report from Gisela, [place] who states patient will return home on 08/21 when daughter has made arrangements  for 24 hour care.   VS: 98.3, 108/97, 101, 21, 98% 2Lnc I/O:  1195.03/1048  Labs: Na 134, Chloride 97, Glucose 141, Calcium 8.8,   Medications: Rocephin 1gm IVPB QD, Xanax 0.25mg  TID PRN @ 1043, Tramadol 25mg  TID PRN @ 0108, 1245  Per MD notes: Assessment & Plan: Principal Problem:   COPD exacerbation ([place]) Active Problems:   Hypertension   GERD (gastroesophageal reflux disease)   Leg pain 1 acute exacerbation of end-stage COPD Patient noted on admission to be wheezing, with worsening shortness of breath.  Patient with end-stage COPD on home hospice.  Continue IV Solu-Medrol 40 mg every 12 hours, scheduled duo nebs.  Placed on Pulmicort twice daily.  Placed on Claritin, Flonase, IV Rocephin.  Follow. 2.  Left lower extremity pain Secondary to multi-joint osteoarthritis.  Place on scheduled Tylenol 500 mg 3 times daily.  Tramadol as needed.  PT/OT.  Supportive care. 3.  Hypertension Continue home regimen losartan. 4.  Gastroesophageal reflux disease PPI.  Communication with PCG: Daughter is making arrangements for 24 hour caregivers and the plan is for patient to discharge back home with extra support and Hospice.  Communication with IDG: team  updated.  Goals of Care: DNR. Return home with additional care givers and Hospice support.   Please [place] [facility] for ambulance transport. [facility] is contracted with [facility] for all active hospice patients. Patient has a key to her home with her and the home has a lock box on the front door with code [phone].  Please call with any hospice related questions or concerns.   Thank you, Farrel Gordon, RN, CCM [facility] hospital liaison ( listed on [employer])  [phone]

## 2018-11-09 DIAGNOSIS — M1990 Unspecified osteoarthritis, unspecified site: Secondary | ICD-10-CM | POA: Diagnosis present

## 2018-11-09 DIAGNOSIS — M159 Polyosteoarthritis, unspecified: Secondary | ICD-10-CM

## 2018-11-09 DIAGNOSIS — J449 Chronic obstructive pulmonary disease, unspecified: Secondary | ICD-10-CM

## 2018-11-09 LAB — BASIC METABOLIC PANEL
Anion gap: 11 (ref 5–15)
BUN: 26 mg/dL — ABNORMAL HIGH (ref 8–23)
CO2: 28 mmol/L (ref 22–32)
Calcium: 9.1 mg/dL (ref 8.9–10.3)
Chloride: 96 mmol/L — ABNORMAL LOW (ref 98–111)
Creatinine, Ser: 0.71 mg/dL (ref 0.44–1.00)
GFR calc Af Amer: >60 mL/min (ref >60)
GFR calc non Af Amer: >60 mL/min (ref >60)
Glucose, Bld: 80 mg/dL (ref 70–99)
Potassium: 4.6 mmol/L (ref 3.5–5.1)
Sodium: 135 mmol/L (ref 135–145)

## 2018-11-09 LAB — CBC WITH DIFFERENTIAL/PLATELET
Abs Immature Granulocytes: 0.05 10*3/uL (ref 0.00–0.07)
Basophils Absolute: 0 10*3/uL (ref 0.0–0.1)
Basophils Relative: 0 %
Eosinophils Absolute: 0 10*3/uL (ref 0.0–0.5)
Eosinophils Relative: 0 %
HCT: 41.7 % (ref 36.0–46.0)
Hemoglobin: 13.2 g/dL (ref 12.0–15.0)
Immature Granulocytes: 1 %
Lymphocytes Relative: 19 %
Lymphs Abs: 1.7 10*3/uL (ref 0.7–4.0)
MCH: 30.6 pg (ref 26.0–34.0)
MCHC: 31.7 g/dL (ref 30.0–36.0)
MCV: 96.5 fL (ref 80.0–100.0)
Monocytes Absolute: 1 10*3/uL (ref 0.1–1.0)
Monocytes Relative: 10 %
Neutro Abs: 6.5 10*3/uL (ref 1.7–7.7)
Neutrophils Relative %: 70 %
Platelets: 240 10*3/uL (ref 150–400)
RBC: 4.32 MIL/uL (ref 3.87–5.11)
RDW: 14.2 % (ref 11.5–15.5)
WBC: 9.3 10*3/uL (ref 4.0–10.5)
nRBC: 0 % (ref 0.0–0.2)

## 2018-11-09 MED ORDER — LORATADINE 10 MG PO TABS: 10.0000 mg | ORAL_TABLET | Freq: Every day | ORAL | 0 refills | Status: AC

## 2018-11-09 MED ORDER — PANTOPRAZOLE SODIUM 40 MG PO TBEC: 40.0000 mg | DELAYED_RELEASE_TABLET | Freq: Every day | ORAL | 1 refills | Status: AC

## 2018-11-09 MED ORDER — ACETAMINOPHEN 500 MG PO TABS: 500.0000 mg | ORAL_TABLET | Freq: Three times a day (TID) | ORAL | 0 refills | Status: AC

## 2018-11-09 MED ORDER — AZITHROMYCIN 250 MG PO TABS: 250.0000 mg | ORAL_TABLET | Freq: Every day | ORAL | 0 refills | Status: AC

## 2018-11-09 MED ORDER — ENSURE ENLIVE PO LIQD: 237.0000 mL | Freq: Two times a day (BID) | ORAL | 0 refills | Status: AC

## 2018-11-09 MED ORDER — FLUTICASONE PROPIONATE 50 MCG/ACT NA SUSP: 2.0000 | Freq: Every day | NASAL | 2 refills | Status: AC

## 2018-11-09 MED ORDER — PREDNISONE 20 MG PO TABS: 40.0000 mg | ORAL_TABLET | Freq: Every day | ORAL | 0 refills | Status: AC

## 2018-11-09 NOTE — TOC Progression Note (Signed)
Transition of Care [facility]) - Progression Note    Patient Details  Name: Sierra Warner MRN: [id number] Date of Birth: 11/25/1928  Transition of Care [facility]) CM/SW Contact  Armanda Forand, Juliann Pulse, RN Phone Number: 11/09/2018, 4:44 PM  Clinical Narrative:  Damaris Schooner to Charisse Klinefelter rep Jenny Reichmann made aware of rw,3n1 per Jearld Shines can go with patient she is active [place] home hospice services. Nsg updated.          Expected Discharge Plan and Services           Expected Discharge Date: 11/09/18                                     Social Determinants of Health (SDOH) Interventions    Readmission Risk Interventions No flowsheet data found.

## 2018-11-09 NOTE — TOC Transition Note (Signed)
Transition of Care [facility]) - CM/SW Discharge Note   Patient Details  Name: CHRISHAWN BELLE MRN: [id number] Date of Birth: 1928/10/14  Transition of Care [facility]) CM/SW Contact:  Dessa Phi, RN Phone Number: 11/09/2018, 10:36 AM   Clinical Narrative: Spoke to patient/dtr Kennyth Lose on phone [phone] about d/c plans-she has private duty help set up, & her aunt/uncle will asst if needed. Patient has rw,& 3n1 in rm to be transported by [facility] with her-patient has her own key, & there is a locked box with key also([facility] aware)-[facility] will have nurse to eval over weekend. No further CM needs.           Patient Goals and CMS Choice        Discharge Placement                       Discharge Plan and Services                                     Social Determinants of Health (SDOH) Interventions     Readmission Risk Interventions No flowsheet data found.

## 2018-11-09 NOTE — Discharge Summary (Signed)
Physician Discharge Summary  Sierra Warner [id number] DOB: Jun 16, 1928 DOA: 11/06/2018  PCP: Patient, No Pcp Per  Admit date: 11/06/2018 Discharge date: 11/09/2018  Time spent: 50 minutes  Recommendations for Outpatient Follow-up:  1. Patient be discharged back home with hospice.  Follow-up with hospice MD.  Hospice MD will need to reassess patient's right lower extremity pain which is secondary to osteoarthritis and will need to continue further pain management.   Discharge Diagnoses:  Principal Problem:   COPD exacerbation (Sierra Warner) Active Problems:   Osteoarthritis   Senile osteoporosis   Stage 4 very severe COPD by GOLD classification (Sierra Warner)   Hypertension   GERD (gastroesophageal reflux disease)   Leg pain   Discharge Condition: Stable and improved.  Diet recommendation: Soft diet with thin liquids.  Filed Weights   11/07/18 0044  Weight: 54 kg    History of present illness:  Per Dr. Enis Slipper Sierra Warner is a 85 y.o. female with medical history significant of atrophic vaginitis, hemorrhoids, hypertension, microscopic hematuria, osteopenia end-stage COPD who came into the emergency department due to left lower extremity pain, particularly on the hip and knee, but radiating all the way down to her left foot.  She denied trauma to the area and had been taking azithromycin often with occasional tramadol.  She had been having wheezing and dyspnea.  She denied fever, chills, rhinorrhea, sore throat, hemoptysis.  No chest pain, palpitations, diaphoresis, PND, orthopnea or pitting edema of the lower extremities.  Denied abdominal pain, nausea or emesis, melena or hematochezia.  She gets frequent constipation.  No dysuria, frequency or hematuria.  Denies polyuria, polydipsia, polyphagia or blurred vision.s  ED Course: Initial vital signs temperature 98.2 F, pulse 94, respirations 25, blood pressure 154/68 mmHg and O2 sat 98% on room air.  Later in the evening the patient became tachypneic  while in the emergency department.  She received bronchodilators along with supplemental oxygen and IV Solu-Medrol.  Her urinalysis had a hazy appearance, small hemoglobinuria and moderate leukocyte esterase.  CBC was normal.  BMP showed a glucose of 120 mg/dL but was otherwise unremarkable.    Imaging: chest radiograph did not show any active disease.  Hip x-ray showed moderate DJD changes.  CT pelvis no acute on normalities, but once again is show bilateral osteoarthritis.  There was a left lower quadrant hernia containing loops of bowel, but without a complicating feature.  There were small fat-containing inguinal hernias a midline ventral hernias please see images and full radiology report for further detail.  Hospital Course:  1 acute exacerbation of end-stage COPD/chronic respiratory failure secondary to end-stage COPD on chronic home O2 Patient noted on admission to be wheezing, with worsening shortness of breath.  Patient with end-stage COPD on home hospice.  Patient was placed on IV Solu-Medrol during the hospitalization as well as scheduled nebs and Pulmicort and Claritin and Flonase.  Patient also placed on IV Rocephin.  Patient improved clinically subsequently transition to oral azithromycin will be discharged home on 2 more days of oral azithromycin.  Patient was transitioned from IV Solu-Medrol to oral prednisone.  Patient be discharged home on 2 more days of oral prednisone.  Outpatient follow-up with hospice MD.   2.  Left lower extremity pain/osteoarthritis Secondary to multi-joint osteoarthritis.    Patient was placed on scheduled Tylenol 500 mg 3 times daily.  Patient also placed on tramadol as needed.  Patient was seen by PT OT.  Patient maintained on supportive care.  Patient is  being discharged home back with hospice.  Follow-up with hospice MD for further pain management.   3.  Hypertension Blood pressure remained stable during the hospitalization.  Patient maintained on home  regimen of losartan.  Outpatient follow-up.   4.  Gastroesophageal reflux disease Patient maintained on a PPI throughout the hospitalization.  Patient will be discharged home on a PPI.  Outpatient follow-up.    Procedures:  CT pelvis/CT L-spine 11/06/2018  Chest x-ray 11/06/2018  Plain films of the left hip and pelvis 11/06/2018  Consultations:  None  Discharge Exam: Vitals:   11/09/18 1257 11/09/18 1419  BP: 116/72   Pulse: (!) 104 (!) 101  Resp: 20 20  Temp: 99.6 F (37.6 C)   SpO2: 97% 97%    General: NAD Cardiovascular: RRR Respiratory: CTAB.  Poor to fair air movement.  No wheezing.  No crackles.  No rhonchi.  Discharge Instructions   Discharge Instructions    Diet general   Complete by: As directed    Soft diet with thin liquids.   Increase activity slowly   Complete by: As directed      Allergies as of 11/09/2018      Reactions   Sulfa Antibiotics Hives, Rash      Medication List    STOP taking these medications   doxycycline 100 MG tablet Commonly known as: VIBRA-TABS   furosemide 20 MG tablet Commonly known as: LASIX   Hydromet 5-1.5 MG/5ML syrup Generic drug: HYDROcodone-homatropine   Trelegy Ellipta 100-62.5-25 MCG/INH Aepb Generic drug: Fluticasone-Umeclidin-Vilant     TAKE these medications   acetaminophen 500 MG tablet Commonly known as: TYLENOL Take 1 tablet (500 mg total) by mouth 3 (three) times daily for 10 days. What changed:   how much to take  when to take this  reasons to take this   albuterol 108 (90 Base) MCG/ACT inhaler Commonly known as: VENTOLIN HFA Inhale 2 puffs into the lungs every 6 (six) hours as needed for wheezing or shortness of breath.   ALPRAZolam 0.25 MG tablet Commonly known as: XANAX Take 1 tablet (0.25 mg total) by mouth 3 (three) times daily as needed for anxiety.   azithromycin 250 MG tablet Commonly known as: ZITHROMAX Take 1 tablet (250 mg total) by mouth daily for 2 days. Start taking  on: November 10, 2018   CALCIUM + D PO Take by mouth 2 (two) times daily.   Centrum Silver 50+Women Tabs Take 1 tablet by mouth.   cholecalciferol 1000 units tablet Commonly known as: VITAMIN D Take 1,000 Units by mouth daily.   feeding supplement (ENSURE ENLIVE) Liqd Take 237 mLs by mouth 2 (two) times daily between meals.   fluticasone 50 MCG/ACT nasal spray Commonly known as: FLONASE Place 2 sprays into both nostrils daily.   hydrOXYzine 10 MG tablet Commonly known as: ATARAX/VISTARIL Take 10 mg by mouth every 4 (four) hours as needed for itching.   ipratropium-albuterol 0.5-2.5 (3) MG/3ML Soln Commonly known as: DUONEB USE 1 VIAL VIA NEBULIZER FOUR TIMES DAILY AS NEEDED What changed: See the new instructions.   Combivent Respimat 20-100 MCG/ACT Aers respimat Generic drug: Ipratropium-Albuterol INHALE 1 PUFF INTO THE LUNGS EVERY 6 HOURS AS NEEDED FOR WHEEZING OR SHORTNESS OF BREATH What changed: See the new instructions.   loratadine 10 MG tablet Commonly known as: CLARITIN Take 1 tablet (10 mg total) by mouth daily. Start taking on: November 10, 2018   losartan 100 MG tablet Commonly known as: COZAAR TAKE 1 TABLET BY MOUTH  DAILY   MIRALAX PO Take 1 packet by mouth daily.   Mucinex 600 MG 12 hr tablet Generic drug: guaiFENesin Take 600 mg by mouth 2 (two) times daily.   pantoprazole 40 MG tablet Commonly known as: PROTONIX Take 1 tablet (40 mg total) by mouth daily. Start taking on: November 10, 2018   predniSONE 20 MG tablet Commonly known as: DELTASONE Take 2 tablets (40 mg total) by mouth daily with breakfast for 2 days. Start taking on: November 10, 2018   temazepam 15 MG capsule Commonly known as: RESTORIL Take 15 mg by mouth at bedtime as needed for sleep.   traMADol 50 MG tablet Commonly known as: ULTRAM Take 25 mg by mouth every 8 (eight) hours as needed for moderate pain.            Durable Medical Equipment  (From admission, onward)          Start     Ordered   11/07/18 1716  For home use only DME 4 wheeled rolling walker with seat  Once    Question Answer Comment  Patient needs a walker to treat with the following condition Osteoarthritis   Patient needs a walker to treat with the following condition Debility      11/07/18 1715   11/07/18 1716  For home use only DME 3 n 1  Once     11/07/18 1715         Allergies  Allergen Reactions  . Sulfa Antibiotics Hives and Rash   Follow-up Information    [facility] up.   Specialty: PALLIATIVE CARE Why: f/u in 1-2 weeks. Contact information: [place] [phone]           The results of significant diagnostics from this hospitalization (including imaging, microbiology, ancillary and laboratory) are listed below for reference.    Significant Diagnostic Studies: Ct Lumbar Spine Wo Contrast  Result Date: 11/06/2018 CLINICAL DATA:  Initial evaluation for left lower extremity pain for 2 weeks. EXAM: CT LUMBAR SPINE WITHOUT CONTRAST TECHNIQUE: Multidetector CT imaging of the lumbar spine was performed without intravenous contrast administration. Multiplanar CT image reconstructions were also generated. COMPARISON:  None available. FINDINGS: Segmentation: Transitional lumbosacral anatomy. Lowest rib-bearing vertebral body labeled T12, and there is sacralization of the L5 vertebral body. L5-S1 disc is vestigial and rudimentary. Alignment: Trace dextroscoliosis with straightening of the normal lumbar lordosis. No listhesis or malalignment. Vertebrae: Vertebral body height maintained without evidence for acute or chronic fracture. Visualized sacrum and pelvis intact. SI joints approximated symmetric. No discrete lytic or blastic osseous lesions. Paraspinal and other soft tissues: Paraspinous soft tissues demonstrate no acute finding. Advanced aorto bi-iliac atherosclerotic disease. No visible aneurysm. Remainder the  visualized visceral structures otherwise unremarkable. Disc levels: T12-L1: Chronic intervertebral disc space narrowing with mild diffuse disc bulge and disc desiccation. Reactive endplate changes with marginal endplate osteophytic spurring. Mild facet hypertrophy. No significant spinal stenosis. Mild right with moderate left foraminal narrowing. L1-2: Chronic intervertebral disc space narrowing with diffuse disc bulge and disc desiccation. Reactive endplate changes with marginal endplate osteophytic spurring. Moderate facet hypertrophy, slightly greater on the left. No significant spinal stenosis. Moderate left worse than right L1 foraminal stenosis. L2-3: Diffuse disc bulge with intervertebral disc space narrowing. Mild reactive endplate changes. Moderate facet hypertrophy. Resultant mild spinal stenosis. Moderate bilateral L2 foraminal narrowing. L3-4: Chronic intervertebral disc space narrowing with disc desiccation and diffuse disc bulge. Moderate facet and ligament flavum hypertrophy. Resultant moderate  spinal stenosis. Moderate bilateral L3 foraminal narrowing, slightly worse on the right. L4-5: Diffuse disc bulge with disc desiccation and intervertebral disc space narrowing. Reactive endplate changes. Moderate to advanced right worse than left facet hypertrophy. Resultant severe bilateral lateral recess stenosis, slightly worse on the right. Severe right with moderate left L4 foraminal narrowing. L5-S1: Transitional lumbosacral anatomy with sacralization of the L5 vertebral body and rudimentary L5-S1 interspace. No significant disc bulge. Mild bilateral facet hypertrophy. No stenosis or impingement. IMPRESSION: 1. No CT evidence for acute abnormality within the lumbar spine. 2. Moderate multilevel degenerative spondylolysis with resultant mild to moderate spinal stenosis at L2-3 and L3-4, with severe bilateral lateral recess stenosis at L4-5. 3. Multifactorial degenerative changes with resultant moderate to  advanced multilevel foraminal narrowing as above, most notable at L4-5. 4. Transitional lumbosacral anatomy. 5. Advanced aorto bi-iliac atherosclerotic disease. Electronically Signed   By: Jeannine Boga M.D.   On: 11/06/2018 21:59   Ct Pelvis Wo Contrast  Result Date: 11/06/2018 CLINICAL DATA:  Left leg pain for 2 weeks.  No known injury. EXAM: CT OF THE PELVIS EXTREMITY WITHOUT CONTRAST TECHNIQUE: Multidetector CT imaging of the pelvis was performed according to the standard protocol. COMPARISON:  Plain films left hip earlier today. FINDINGS: Bones/Joint/Cartilage No acute bony or joint abnormality is identified. Moderate to moderately severe bilateral hip osteoarthritis is seen is worse on the left. Lower lumbar degenerative disc disease is noted. There is also some degenerative change about the SI joints, worse on the left Ligaments Suboptimally assessed by CT. Muscles and Tendons Appear normal. Soft tissues No acute abnormality is identified. A left lower quadrant hernia contains loops of bowel without obstruction or other complicating feature. There is a small fat containing midline ventral hernia. Small fat containing inguinal hernias are seen. Atherosclerosis is noted. IMPRESSION: No acute abnormality. Left worse than right hip osteoarthritis. Lower lumbar degenerative change also noted. Atherosclerosis. Left lower quadrant hernia contains loops of bowel without complicating feature. Small fat containing inguinal hernias and midline ventral hernia are also seen. Electronically Signed   By: Inge Rise M.D.   On: 11/06/2018 21:50   Dg Chest Portable 1 View  Result Date: 11/06/2018 CLINICAL DATA:  Shortness of breath. EXAM: PORTABLE CHEST 1 VIEW COMPARISON:  October 16, 2017 FINDINGS: Cardiomediastinal silhouette is normal. Mediastinal contours appear intact. Calcific atherosclerotic disease of the aorta. There is no evidence of focal airspace consolidation, pleural effusion or pneumothorax.  Osseous structures are without acute abnormality. Soft tissues are grossly normal. IMPRESSION: No active disease. Electronically Signed   By: Fidela Salisbury M.D.   On: 11/06/2018 19:27   Dg Hip Unilat W Or Wo Pelvis 2-3 Views Left  Result Date: 11/06/2018 CLINICAL DATA:  Pain, left leg pain for 2 weeks. EXAM: DG HIP (WITH OR WITHOUT PELVIS) 2-3V LEFT COMPARISON:  Right hip radiographs November 13, 2003 FINDINGS: Diffuse osteopenia may limit detection of small, nondisplaced fractures. No visible fracture or traumatic malalignment is evident. The femoral heads remain normally located. No proximal femoral fractures are identified. There are moderate hypertrophic osteoarthrosis features in both hips as well as at the SI joints and symphysis pubis. The sacrum is partially obscured by overlying bowel gas. Discogenic and facet degenerative changes are present in the lower lumbar spine most pronounced at the lumbosacral junction. Gaseous distention of what appears to be the stomach. Remainder of the bowel gas pattern is unremarkable. Soft tissues are otherwise free of acute abnormality. IMPRESSION: 1. Diffuse osteopenia may limit detection of  small, nondisplaced fractures. No visible fracture or traumatic malalignment. 2. Moderate degenerative changes of the hips and SI joints. Electronically Signed   By: Lovena Le M.D.   On: 11/06/2018 19:25    Microbiology: Recent Results (from the past 240 hour(s))  SARS Coronavirus 2 [facility] order, Performed in [facility] hospital lab) Nasopharyngeal Nasopharyngeal Swab     Status: None   Collection Time: 11/06/18 10:02 PM   Specimen: Nasopharyngeal Swab  Result Value Ref Range Status   SARS Coronavirus 2 NEGATIVE NEGATIVE Final    Comment: (NOTE) If result is NEGATIVE SARS-CoV-2 target nucleic acids are NOT DETECTED. The SARS-CoV-2 RNA is generally detectable in upper and lower  respiratory specimens during the acute phase of infection. The lowest   concentration of SARS-CoV-2 viral copies this assay can detect is 250  copies / mL. A negative result does not preclude SARS-CoV-2 infection  and should not be used as the sole basis for treatment or other  patient management decisions.  A negative result may occur with  improper specimen collection / handling, submission of specimen other  than nasopharyngeal swab, presence of viral mutation(s) within the  areas targeted by this assay, and inadequate number of viral copies  (<250 copies / mL). A negative result must be combined with clinical  observations, patient history, and epidemiological information. If result is POSITIVE SARS-CoV-2 target nucleic acids are DETECTED. The SARS-CoV-2 RNA is generally detectable in upper and lower  respiratory specimens dur ing the acute phase of infection.  Positive  results are indicative of active infection with SARS-CoV-2.  Clinical  correlation with patient history and other diagnostic information is  necessary to determine patient infection status.  Positive results do  not rule out bacterial infection or co-infection with other viruses. If result is PRESUMPTIVE POSTIVE SARS-CoV-2 nucleic acids MAY BE PRESENT.   A presumptive positive result was obtained on the submitted specimen  and confirmed on repeat testing.  While 2019 novel coronavirus  (SARS-CoV-2) nucleic acids may be present in the submitted sample  additional confirmatory testing may be necessary for epidemiological  and / or clinical management purposes  to differentiate between  SARS-CoV-2 and other Sarbecovirus currently known to infect humans.  If clinically indicated additional testing with an alternate test  methodology [phone]) is advised. The SARS-CoV-2 RNA is generally  detectable in upper and lower respiratory sp ecimens during the acute  phase of infection. The expected result is Negative. Fact Sheet for Patients:  StrictlyIdeas.no Fact Sheet  for Healthcare Providers: BankingDealers.co.za This test is not yet approved or cleared by the [place] FDA and has been authorized for detection and/or diagnosis of SARS-CoV-2 by FDA under an Emergency Use Authorization (EUA).  This EUA will remain in effect (meaning this test can be used) for the duration of the COVID-19 declaration under Section 564(b)(1) of the Act, 21 U.S.C. section 360bbb-3(b)(1), unless the authorization is terminated or revoked sooner. Performed at [facility], [place] [address] [place], [place] [address]   Culture, Urine     Status: Abnormal   Collection Time: 11/07/18 12:09 PM   Specimen: Urine, Random  Result Value Ref Range Status   Specimen Description   Final    URINE, RANDOM Performed at [place] [address], [place], [place] [address]    Special Requests   Final    NONE Performed at [facility], [place] [address] [place], [place] [address]    Culture (A)  Final    <10,000 COLONIES/mL INSIGNIFICANT GROWTH Performed at [place] [address] [place], [place] [address]    Report Status 11/08/2018 FINAL  Final     Labs: Basic Metabolic Panel: Recent Labs  Lab 11/06/18 1816 11/08/18 0454 11/09/18 0448  NA 137 134* 135  K 4.3 4.5 4.6  CL 98 97* 96*  CO2 28 25 28   GLUCOSE 120* 141* 80  BUN 21 19 26*  CREATININE 0.79 0.59 0.71  CALCIUM 9.4 8.8* 9.1  MG 2.4  --   --   PHOS 4.6  --   --    Liver Function Tests: No results for input(s): AST, ALT, ALKPHOS, BILITOT, PROT, ALBUMIN in the last 168 hours. No results for input(s): LIPASE, AMYLASE in the last 168 hours. No results for input(s): AMMONIA in the last 168 hours. CBC: Recent Labs  Lab 11/06/18 1816 11/08/18 0454 11/09/18 0448  WBC 9.9 9.7 9.3  NEUTROABS  --   --  6.5  HGB 13.1 12.2 13.2  HCT 40.6 38.3 41.7  MCV 95.5 96.5 96.5  PLT 246 211 240   Cardiac Enzymes: No results  for input(s): CKTOTAL, CKMB, CKMBINDEX, TROPONINI in the last 168 hours. BNP: BNP (last 3 results) No results for input(s): BNP in the last 8760 hours.  ProBNP (last 3 results) No results for input(s): PROBNP in the last 8760 hours.  CBG: No results for input(s): GLUCAP in the last 168 hours.     Signed:  Irine Seal MD.  [facility] 11/09/2018, 2:34 PM

## 2018-11-09 NOTE — Progress Notes (Signed)
Occupational Therapy Treatment Patient Details Name: Sierra Warner MRN: [id number] DOB: [birth year] Today's Date: 11/09/2018    History of present illness Sierra Warner is a 85 y.o. female with medical history significant of hypertension, microscopic hematuria, osteopenia, end-stage COPD who presented to the ED due to left lower extremity pain, particularly on the hip and knee.   OT comments  Pt progressing towards acute OT goals. Pt received in recliner, reporting discomfort and "all over", "arthritis" pain, requesting to get out of recliner. Recommend pillow(s) placed in seat of recliner if pt wants to get up to chair again to improve tolerance for sitting in recliner during the day. Focus of session was toilet transfer and pericare. D/c plan remains appropriate.    Follow Up Recommendations  Home health OT;Supervision/Assistance - 24 hour;Other (comment)    Equipment Recommendations  None recommended by OT    Recommendations for Other Services      Precautions / Restrictions Precautions Precautions: Fall Precaution Comments: on O2 at baseline Restrictions Weight Bearing Restrictions: No       Mobility Bed Mobility Overal bed mobility: Needs Assistance Bed Mobility: Sit to Supine     Supine to sit: HOB elevated;Modified independent (Device/Increase time) Sit to supine: Min guard;HOB elevated   General bed mobility comments: HOB slightly elevated  Transfers Overall transfer level: Needs assistance Equipment used: Rolling walker (2 wheeled) Transfers: Sit to/from Stand Sit to Stand: Min guard         General transfer comment: min/guard for safety    Balance Overall balance assessment: Needs assistance Sitting-balance support: Feet supported;No upper extremity supported Sitting balance-Leahy Scale: Good Sitting balance - Comments: pt used grab bars for dynamic sitting activities     Standing balance-Leahy Scale: Fair Standing balance comment: pt can perform static  standing without support however requires external support with UE's for dynamic standing and gait                           ADL either performed or assessed with clinical judgement   ADL Overall ADL's : Needs assistance/impaired                         Toilet Transfer: Minimal assistance;BSC;RW;Cueing for safety   Toileting- Clothing Manipulation and Hygiene: Minimal assistance;Sit to/from stand;Cueing for safety       Functional mobility during ADLs: Minimal assistance;Rolling walker;Cueing for sequencing;Cueing for safety General ADL Comments: Pt completed toilet transfer and pericare.     Vision       Perception     Praxis      Cognition Arousal/Alertness: Awake/alert Behavior During Therapy: WFL for tasks assessed/performed Overall Cognitive Status: No family/caregiver present to determine baseline cognitive functioning Area of Impairment: Memory                     Memory: Decreased short-term memory         General Comments: pt stated she's not walked at all during this hospitalization, she did walk on 11/07/18 with PT. She also doesn't recall receiving an inhaler an hour ago.        Exercises     Shoulder Instructions       General Comments      Pertinent Vitals/ Pain       Pain Assessment: Faces Pain Score: 3  Faces Pain Scale: Hurts little more Pain Location: LLE with activity Pain Descriptors / Indicators: Aching  Pain Intervention(s): Monitored during session;Repositioned;Limited activity within patient's tolerance  Home Living                                          Prior Functioning/Environment              Frequency  Min 2X/week        Progress Toward Goals  OT Goals(current goals can now be found in the care plan section)  Progress towards OT goals: Progressing toward goals  Acute Rehab OT Goals Patient Stated Goal: to return home and not need as much oxygen support OT Goal  Formulation: With patient Time For Goal Achievement: 11/15/18 Potential to Achieve Goals: Good ADL Goals Pt Will Perform Grooming: with supervision;standing Pt Will Perform Lower Body Dressing: with supervision;sit to/from stand Pt Will Transfer to Toilet: with supervision;ambulating;regular height toilet Pt Will Perform Toileting - Clothing Manipulation and hygiene: with supervision;sit to/from stand  Plan Discharge plan remains appropriate    Co-evaluation                 AM-PAC OT "6 Clicks" Daily Activity     Outcome Measure   Help from another person eating meals?: None Help from another person taking care of personal grooming?: A Little Help from another person toileting, which includes using toliet, bedpan, or urinal?: A Little Help from another person bathing (including washing, rinsing, drying)?: A Little Help from another person to put on and taking off regular upper body clothing?: None Help from another person to put on and taking off regular lower body clothing?: A Little 6 Click Score: 20    End of Session Equipment Utilized During Treatment: Rolling walker;Oxygen  OT Visit Diagnosis: Unsteadiness on feet (R26.81);Muscle weakness (generalized) (M62.81);History of falling (Z91.81)   Activity Tolerance Patient tolerated treatment well;Patient limited by fatigue   Patient Left in bed;with bed alarm set(sitting EOB eating lunch, door open)   Nurse Communication          [id number] OT Time Calculation (min): 19 min  Charges: OT General Charges $OT Visit: 1 Visit OT Treatments $Self Care/Home Management : 8-22 mins  Tyrone Schimke, OT Acute Rehabilitation Services Pager: [phone] Office: [phone]    Hortencia Pilar 11/09/2018, 12:50 PM

## 2018-11-09 NOTE — Progress Notes (Signed)
Physical Therapy Treatment Patient Details Name: Sierra Warner MRN: [id number] DOB: [birth year] Today's Date: 11/09/2018    History of Present Illness Sierra Warner is a 85 y.o. female with medical history significant of hypertension, microscopic hematuria, osteopenia, end-stage COPD who presented to the ED due to left lower extremity pain, particularly on the hip and knee.    PT Comments    Pt ambulated 260' with RW and 3L O2, SaO2 96%, 2/4 dyspnea. Pt is having short term memory issues, she doesn't recall walking with PT 2 days ago, and doesn't recall having an inhaler an hour ago. She would benefit from 24* supervision for safety 2* memory issues.    Follow Up Recommendations  Home health PT;Supervision/Assistance - 24 hour;Supervision for mobility/OOB;SNF(if supervision and assistance is unavailable at home patient will benefit from placement in SNF to provide additional assistance)     Equipment Recommendations  Rolling walker with 5" wheels;3in1 (PT)    Recommendations for Other Services       Precautions / Restrictions Precautions Precautions: Fall Precaution Comments: on O2 at baseline Restrictions Weight Bearing Restrictions: No    Mobility  Bed Mobility Overal bed mobility: Needs Assistance Bed Mobility: Supine to Sit     Supine to sit: HOB elevated;Modified independent (Device/Increase time)     General bed mobility comments: HOB slightly elevated  Transfers Overall transfer level: Needs assistance Equipment used: Rolling walker (2 wheeled) Transfers: Sit to/from Stand Sit to Stand: Min guard         General transfer comment: min/guard for safety  Ambulation/Gait Ambulation/Gait assistance: Min guard Gait Distance (Feet): 260 Feet Assistive device: Rolling walker (2 wheeled) Gait Pattern/deviations: Step-through pattern;Decreased stride length;Decreased step length - right;Decreased step length - left;Trunk flexed Gait velocity: decreased   General  Gait Details: steady with RW, no loss of balance, ambulated with 3L O2, SaO2 96% walking   Stairs             Wheelchair Mobility    Modified Rankin (Stroke Patients Only)       Balance Overall balance assessment: Needs assistance Sitting-balance support: Feet supported;No upper extremity supported Sitting balance-Leahy Scale: Good Sitting balance - Comments: pt used grab bars for dynamic sitting activities     Standing balance-Leahy Scale: Fair Standing balance comment: pt can perform static standing without support however requires external support with UE's for dynamic standing and gait                            Cognition Arousal/Alertness: Awake/alert Behavior During Therapy: WFL for tasks assessed/performed Overall Cognitive Status: No family/caregiver present to determine baseline cognitive functioning Area of Impairment: Memory                     Memory: Decreased short-term memory         General Comments: pt stated she's not walked at all during this hospitalization, she did walk on 11/07/18 with PT. She also doesn't recall receiving an inhaler an hour ago.      Exercises      General Comments        Pertinent Vitals/Pain Pain Score: 3  Pain Location: LLE with activity Pain Descriptors / Indicators: Aching Pain Intervention(s): Limited activity within patient's tolerance;Monitored during session;Premedicated before session    Home Living                      Prior Function  PT Goals (current goals can now be found in the care plan section) Acute Rehab PT Goals Patient Stated Goal: to return home and not need as much oxygen support PT Goal Formulation: With patient Time For Goal Achievement: 11/16/18 Potential to Achieve Goals: Good Progress towards PT goals: Progressing toward goals    Frequency    Min 3X/week      PT Plan Current plan remains appropriate    Co-evaluation               AM-PAC PT "6 Clicks" Mobility   Outcome Measure  Help needed turning from your back to your side while in a flat bed without using bedrails?: None Help needed moving from lying on your back to sitting on the side of a flat bed without using bedrails?: A Little Help needed moving to and from a bed to a chair (including a wheelchair)?: A Little Help needed standing up from a chair using your arms (e.g., wheelchair or bedside chair)?: A Little Help needed to walk in hospital room?: A Little Help needed climbing 3-5 steps with a railing? : A Lot 6 Click Score: 18    End of Session Equipment Utilized During Treatment: Gait belt Activity Tolerance: Patient tolerated treatment well Patient left: with call bell/phone within reach;in chair;with chair alarm set Nurse Communication: Mobility status PT Visit Diagnosis: Unsteadiness on feet (R26.81);Muscle weakness (generalized) (M62.81);Difficulty in walking, not elsewhere classified (R26.2)     Time: 1152-1207 PT Time Calculation (min) (ACUTE ONLY): 15 min  Charges:  $Gait Training: 8-22 mins                     Blondell Reveal Kistler PT 11/09/2018  Acute Rehabilitation Services Pager [phone] Office [phone]

## 2018-11-09 NOTE — Care Management Important Message (Signed)
Important Message  Patient Details IM Letter given to Dessa Phi RN to present to the Patient Name: Sierra Warner MRN: [id number] Date of Birth: 02/09/1929   Medicare Important Message Given:  Yes     Kerin Salen 11/09/2018, 11:33 AM

## 2018-11-09 NOTE — TOC Transition Note (Signed)
Transition of Care [facility]) - CM/SW Discharge Note   Patient Details  Name: Sierra Warner MRN: [id number] Date of Birth: 1929/01/11  [facility] of [facility] CM/SW Contact:  Dessa Phi, RN Phone Number: 11/09/2018, 3:19 PM   Clinical Narrative: d/c home w/[facility] rep Bevely Palmer aware. [facility] for Active w/[facility] for transport-has rw, 3n1-adapt has delivered to rm. No further CM needs.            Patient Goals and CMS Choice        Discharge Placement                       Discharge Plan and Services                                     Social Determinants of Health (SDOH) Interventions     Readmission Risk Interventions No flowsheet data found.

## 2018-11-27 ENCOUNTER — Telehealth: Payer: Self-pay | Admitting: Internal Medicine

## 2018-11-27 NOTE — Telephone Encounter (Signed)
Spoke with pt. Advised her that she would need an OV before we could give a flu shot. Pt has been scheduled to see Tammy on 11/30/2018 at 1430. Nothing further was needed.

## 2018-11-28 ENCOUNTER — Telehealth: Payer: Self-pay | Admitting: Internal Medicine

## 2018-11-28 NOTE — Telephone Encounter (Signed)
Per Hospice:[facility] wkjust wanted to let him know she will not be living @ home anymore- [facility]

## 2018-11-28 NOTE — Telephone Encounter (Signed)
Ok thanks and noted     SIGNATURE    Dr. Brand Males, M.D., F.C.C.P,  Pulmonary and Critical Care Medicine Staff Physician, [place] Director - Interstitial Lung Disease  Program  Pulmonary [place] at [place], [place], [address]  Pager: [phone], If no answer or between  15:00h - 7:00h: call [phone] Telephone: [phone]  1:46 PM 11/28/2018

## 2018-11-30 ENCOUNTER — Ambulatory Visit (INDEPENDENT_AMBULATORY_CARE_PROVIDER_SITE_OTHER): Admitting: Adult Health

## 2018-11-30 ENCOUNTER — Encounter: Payer: Self-pay | Admitting: Adult Health

## 2018-11-30 ENCOUNTER — Other Ambulatory Visit: Payer: Self-pay

## 2018-11-30 DIAGNOSIS — J9611 Chronic respiratory failure with hypoxia: Secondary | ICD-10-CM

## 2018-11-30 DIAGNOSIS — Z23 Encounter for immunization: Secondary | ICD-10-CM

## 2018-11-30 DIAGNOSIS — J449 Chronic obstructive pulmonary disease, unspecified: Secondary | ICD-10-CM | POA: Diagnosis not present

## 2018-11-30 NOTE — Patient Instructions (Addendum)
Continue on Duoneb Three times a day  .  Discuss with Hospice current regimen .  Continue on Oxygen 2l/m  Flu shot today .  Follow up with Dr. [name] in 3-4 months and As needed   Please contact office for sooner follow up if symptoms do not improve or worsen or seek emergency care

## 2018-11-30 NOTE — Assessment & Plan Note (Signed)
Severe COPD that is oxygen dependent.  Patient is on home hospice. Unclear her current regimen.  Has recently been taken off of Trelegy inhaler.  Appears that she is taken DuoNeb nebulizer.  Of advised her to bring her medication list at next visit.  Continue on her current regimen by hospice. Recent exacerbation with hospitalization now resolved.  PLAN  Patient Instructions  Continue on Duoneb Three times a day  .  Discuss with Hospice current regimen .  Continue on Oxygen 2l/m  Flu shot today .  Follow up with Dr. [name] in 3-4 months and As needed   Please contact office for sooner follow up if symptoms do not improve or worsen or seek emergency care    n

## 2018-11-30 NOTE — Progress Notes (Signed)
@Patient  ID: Sierra Warner, female    DOB: 04/27/1928, 85 y.o.   MRN: [id number]  Chief Complaint  Patient presents with  . Follow-up    COPD     Referring provider: No ref. provider found  HPI: 85 year old female followed for severe COPD, chronic hypoxic respiratory failure On home hospice  TEST/EVENTS :  PFTs June 2017 FEV1 50%, ratio 37, FVC 101%, DLCO 43%  11/30/2018 Follow-up COPD Patient returns for a follow-up for COPD and oxygen dependent respiratory failure.  Last seen December 2019.  Patient has severe COPD and and is on home oxygen.  Patient was admitted last month for a COPD exacerbation.  She was treated with empiric IV antibiotics and steroids.  She was discharged on prednisone.  Taken off of her Trelegy inhaler.  Discharged on DuoNeb nebulizer.  We discussed these medication changes.  Patient says she does not know her medicines they are given by hospice.  She says she does not want to restart Trelegy inhaler.  Patient was very adamant about staying on her current regimen.  However does not have her current medication list with her.  She says she is doing better with her breathing she denies any increased cough or shortness of breath. Patient is planning to move to assisted living at [facility] she is not happy about moving into an assisted living.  She says she wants to stay at home but her family is forcing her.  Patient is on oxygen 2 L. She would like to get her flu shot today. Chest x-ray last month showed COPD changes without acute process.   Allergies  Allergen Reactions  . Sulfa Antibiotics Hives and Rash    Immunization History  Administered Date(s) Administered  . Influenza Split 12/14/2011, 12/18/2012  . Influenza Whole 12/20/2010  . Influenza, High Dose Seasonal PF 12/23/2015, 11/23/2016, 11/22/2017  . Influenza,inj,Quad PF,6+ Mos 11/26/2013, 12/20/2014  . Pneumococcal Conjugate-13 02/26/2013  . Pneumococcal Polysaccharide-23 10/13/2014    Past  Medical History:  Diagnosis Date  . Atrophic vaginitis   . COPD (chronic obstructive pulmonary disease) ([place])   . COPD, mild ([place])   . Hemorrhoids   . Hypertension   . Microscopic hematuria   . Osteopenia     Tobacco History: Social History   Tobacco Use  Smoking Status Former Smoker  . Packs/day: 0.30  . Years: 45.00  . Pack years: 13.50  . Types: Cigarettes  . Quit date: 03/22/2007  . Years since quitting: 11.7  Smokeless Tobacco Never Used   Counseling given: Not Answered   Outpatient Medications Prior to Visit  Medication Sig Dispense Refill  . albuterol (VENTOLIN HFA) 108 (90 Base) MCG/ACT inhaler Inhale 2 puffs into the lungs every 6 (six) hours as needed for wheezing or shortness of breath.     . ALPRAZolam (XANAX) 0.25 MG tablet Take 1 tablet (0.25 mg total) by mouth 3 (three) times daily as needed for anxiety. 12 tablet 0  . Calcium Carbonate-Vitamin D (CALCIUM + D PO) Take by mouth 2 (two) times daily.      . cholecalciferol (VITAMIN D) 1000 UNITS tablet Take 1,000 Units by mouth daily.    . COMBIVENT RESPIMAT 20-100 MCG/ACT AERS respimat INHALE 1 PUFF INTO THE LUNGS EVERY 6 HOURS AS NEEDED FOR WHEEZING OR SHORTNESS OF BREATH (Patient taking differently: Inhale 1 puff into the lungs every 6 (six) hours as needed for wheezing or shortness of breath. ) 4 g 5  . feeding supplement, ENSURE ENLIVE, (ENSURE  ENLIVE) LIQD Take 237 mLs by mouth 2 (two) times daily between meals.  0  . fluticasone (FLONASE) 50 MCG/ACT nasal spray Place 2 sprays into both nostrils daily. 16 g 2  . hydrOXYzine (ATARAX/VISTARIL) 10 MG tablet Take 10 mg by mouth every 4 (four) hours as needed for itching.     Marland Kitchen ipratropium-albuterol (DUONEB) 0.5-2.5 (3) MG/3ML SOLN USE 1 VIAL VIA NEBULIZER FOUR TIMES DAILY AS NEEDED (Patient taking differently: Take 3 mLs by nebulization every 6 (six) hours as needed (sob). ) 360 mL 0  . loratadine (CLARITIN) 10 MG tablet Take 1 tablet (10 mg total) by mouth daily. 30  tablet 0  . losartan (COZAAR) 100 MG tablet TAKE 1 TABLET BY MOUTH DAILY (Patient taking differently: Take 100 mg by mouth daily. ) 30 tablet 3  . MUCINEX 600 MG 12 hr tablet Take 600 mg by mouth 2 (two) times daily.     . Multiple Vitamins-Minerals (CENTRUM SILVER 50+WOMEN) TABS Take 1 tablet by mouth.    . pantoprazole (PROTONIX) 40 MG tablet Take 1 tablet (40 mg total) by mouth daily. 30 tablet 1  . Polyethylene Glycol 3350 (MIRALAX PO) Take 1 packet by mouth daily.     . temazepam (RESTORIL) 15 MG capsule Take 15 mg by mouth at bedtime as needed for sleep.     . traMADol (ULTRAM) 50 MG tablet Take 25 mg by mouth every 8 (eight) hours as needed for moderate pain.      No facility-administered medications prior to visit.      Review of Systems:   Constitutional:   No  weight loss, night sweats,  Fevers, chills, +fatigue, or  lassitude.  HEENT:   No headaches,  Difficulty swallowing,  Tooth/dental problems, or  Sore throat,                No sneezing, itching, ear ache, nasal congestion, post nasal drip,   CV:  No chest pain,  Orthopnea, PND, swelling in lower extremities, anasarca, dizziness, palpitations, syncope.   GI  No heartburn, indigestion, abdominal pain, nausea, vomiting, diarrhea, change in bowel habits, loss of appetite, bloody stools.   Resp:   No chest wall deformity  Skin: no rash or lesions.  GU: no dysuria, change in color of urine, no urgency or frequency.  No flank pain, no hematuria   MS:  No joint pain or swelling.  No decreased range of motion.  No back pain.    Physical Exam  BP 132/74 (BP Location: Left Arm, Cuff Size: Normal)   Pulse 99   Temp (!) 97.2 F (36.2 C) (Temporal)   Ht 5\' 1"  (1.549 m)   Wt 120 lb 9.6 oz (54.7 kg)   SpO2 96%   BMI 22.79 kg/m   GEN: A/Ox3; pleasant , NAD, frail elderly female on oxygen   HEENT:  [place]/AT,  , NOSE-clear, THROAT-clear, no lesions, no postnasal drip or exudate noted.   NECK:  Supple w/ fair ROM; no JVD;  normal carotid impulses w/o bruits; no thyromegaly or nodules palpated; no lymphadenopathy.    RESP decreased breath sounds in the bases, kyphosis  no accessory muscle use, no dullness to percussion  CARD:  RRR, no m/r/g, no peripheral edema, pulses intact, no cyanosis or clubbing.  GI:   Soft & nt; nml bowel sounds; no organomegaly or masses detected.   Musco: Warm bil, no deformities or joint swelling noted.   Neuro: alert, no focal deficits noted.    Skin: Warm,  no lesions or rashes    Lab Results:  CBC  Imaging:   No results found for: NITRICOXIDE      Assessment & Plan:   COPD, severe (HCC) Severe COPD that is oxygen dependent.  Patient is on home hospice. Unclear her current regimen.  Has recently been taken off of Trelegy inhaler.  Appears that she is taken DuoNeb nebulizer.  Of advised her to bring her medication list at next visit.  Continue on her current regimen by hospice. Recent exacerbation with hospitalization now resolved.  PLAN  Patient Instructions  Continue on Duoneb Three times a day  .  Discuss with Hospice current regimen .  Continue on Oxygen 2l/m  Flu shot today .  Follow up with Dr. [name] in 3-4 months and As needed   Please contact office for sooner follow up if symptoms do not improve or worsen or seek emergency care    n    Chronic respiratory failure with hypoxia ([place]) Continue on oxygen     Rexene Edison, NP 11/30/2018

## 2018-11-30 NOTE — Assessment & Plan Note (Signed)
Continue on oxygen 

## 2018-12-04 ENCOUNTER — Telehealth: Payer: Self-pay | Admitting: Internal Medicine

## 2018-12-04 ENCOUNTER — Other Ambulatory Visit: Payer: Self-pay

## 2018-12-04 DIAGNOSIS — Z20822 Contact with and (suspected) exposure to covid-19: Secondary | ICD-10-CM

## 2018-12-04 NOTE — Telephone Encounter (Signed)
Have been assisting with MR's paperwork and found form  Called patient's daughter Kennyth Lose to request assistance on filling out form.  Kennyth Lose unable to answer if patient has had a Tdap or shingles vaccine.  She requests the form be faxed anyway and she can confer with patient on those 2 particular vaccinations.  Will hold for MR or TP to sign in the morning.

## 2018-12-04 NOTE — Telephone Encounter (Signed)
°  RETURN CALL CHECKING ON THE STATUS OF PAPERWORK  FOR [facility] [phone].Hillery Hunter

## 2018-12-04 NOTE — Telephone Encounter (Signed)
Checked Dr. Golden Pop inbox (mail/faxes) there were no papers from [place] seen. Will check his office also before calling the patient back.

## 2018-12-04 NOTE — Telephone Encounter (Signed)
LMTCB

## 2018-12-05 NOTE — Telephone Encounter (Signed)
Jess handed form to MR to sign and he did. Judeen Hammans is going to fax.   Called Kennyth Lose and let her know the fax was sent. Nothing further needed at this time.

## 2018-12-06 LAB — NOVEL CORONAVIRUS, NAA: SARS-CoV-2, NAA: NOT DETECTED

## 2019-03-06 ENCOUNTER — Telehealth: Payer: Self-pay | Admitting: Internal Medicine

## 2019-03-06 NOTE — Telephone Encounter (Signed)
Per fax received by [place] at [facility], patient was pronounced by hospice nurse at 7:37pm. Condolence card given to MR. Nothing further needed.

## 2019-03-06 NOTE — Telephone Encounter (Signed)
Sorry to hear Sierra Warner passed away yesterday  Plan  - please get me a condolence card - pls change status to deceased     SIGNATURE    Dr. Brand Males, M.D., F.C.C.P,  Pulmonary and Critical Care Medicine Staff Physician, [place] Director - Interstitial Lung Disease  Program  Pulmonary [place] at [place], [place], [address]  Pager: [phone], If no answer or between  15:00h - 7:00h: call [phone] Telephone: [phone]  10:10 AM 03/06/2019

## 2019-03-11 ENCOUNTER — Telehealth: Payer: Self-pay

## 2019-03-11 NOTE — Telephone Encounter (Signed)
03/11/19 Recd DC from [facility]. Sending to Pulmonary for Dr. Royal Piedra to sign.  03/12/19 Recd signed DC and called [employer] to pick up.

## 2019-03-22 DEATH — deceased

## 2019-09-07 IMAGING — CR DG HIP (WITH OR WITHOUT PELVIS) 2-3V LEFT
3 series · 3 of 3 positions shown · non-contrast
Comparison: Right hip radiographs November 13, 2003

CLINICAL DATA: Pain, left leg pain for 2 weeks.

EXAM:
DG HIP (WITH OR WITHOUT PELVIS) 2-3V LEFT

[x pelvis]
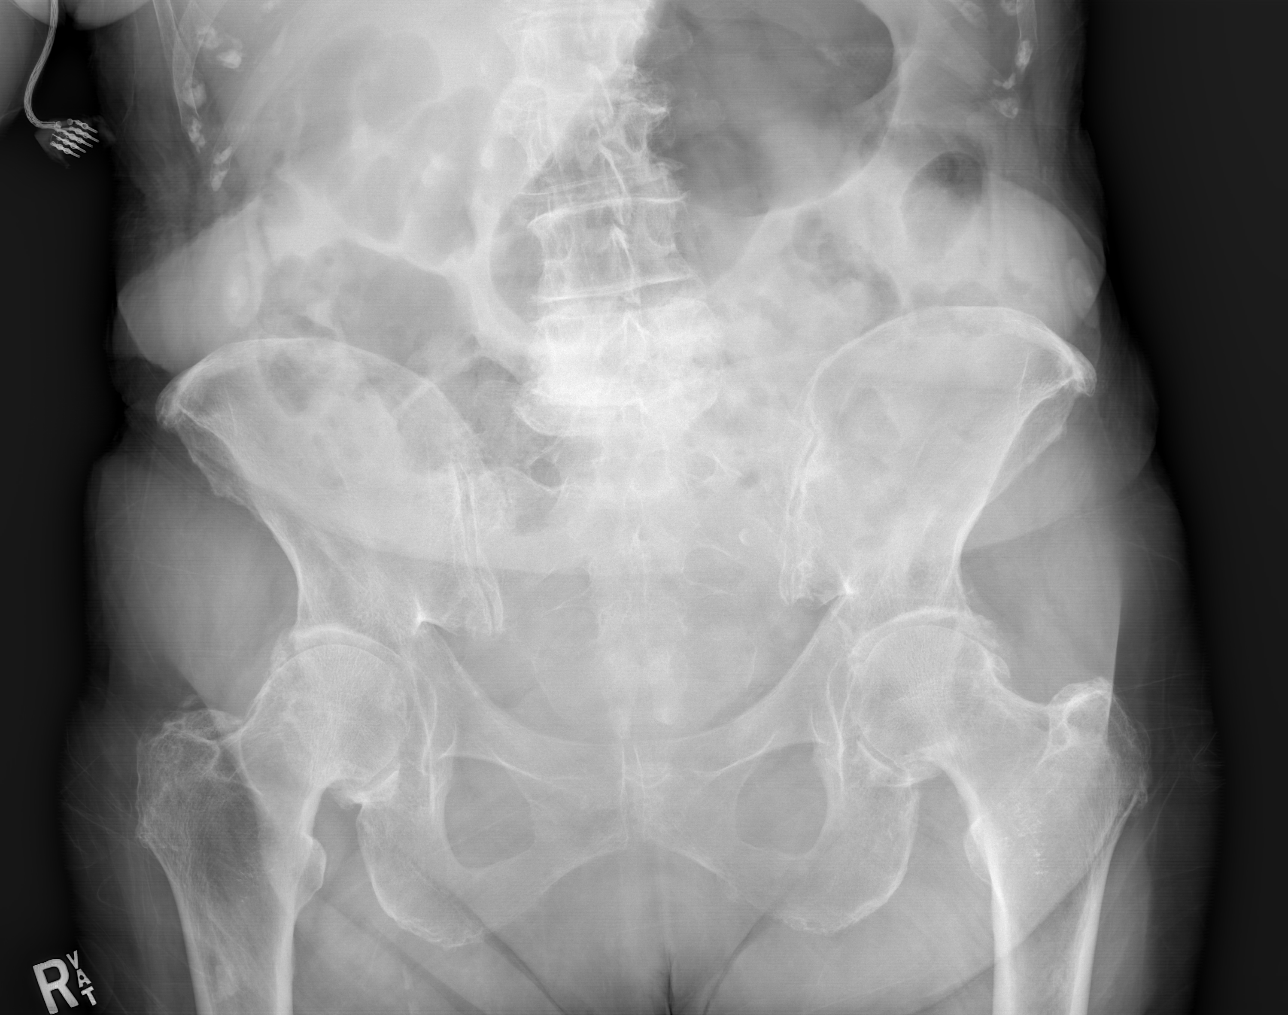

[x hip ap left]
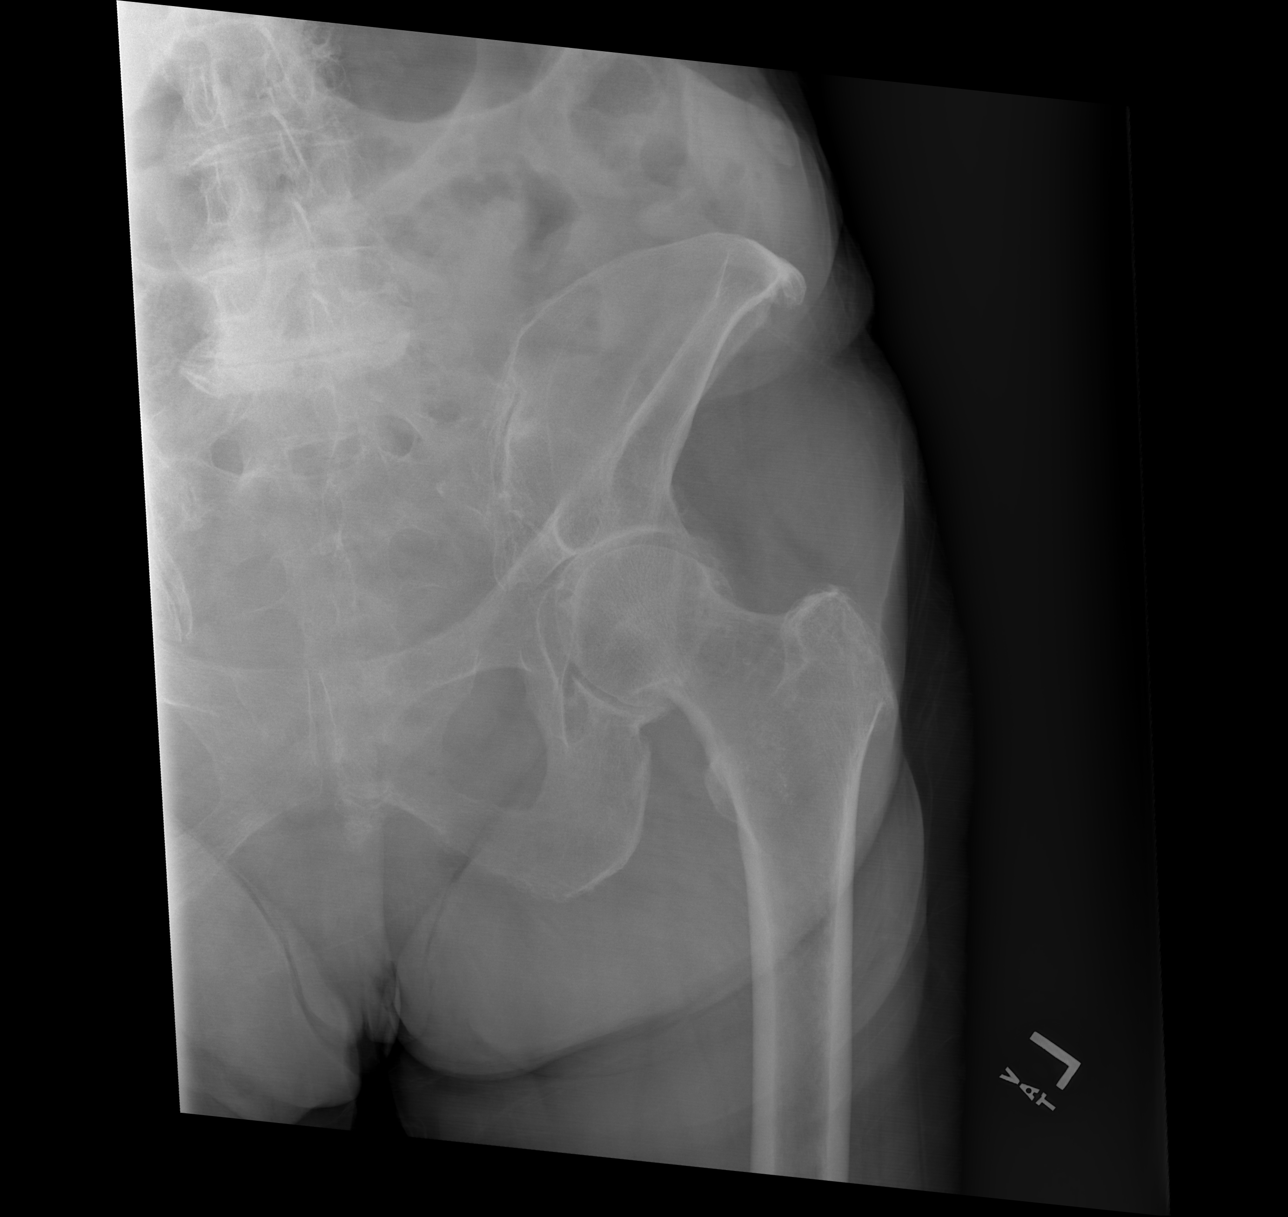

[x hip lat left]
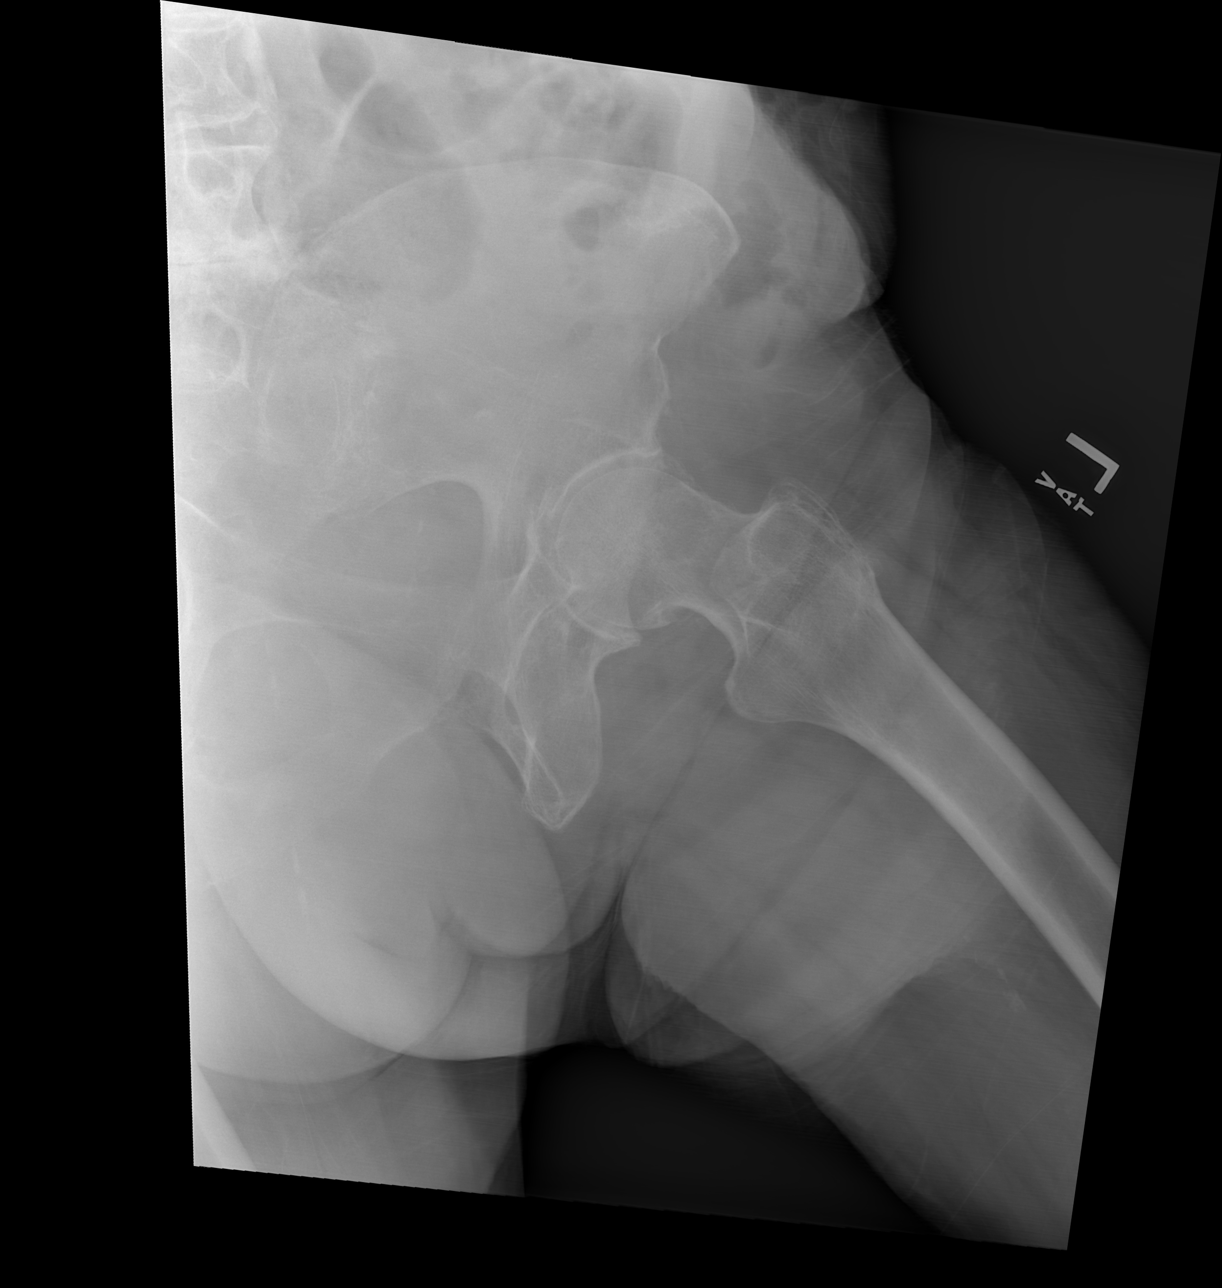

[3 of 3 positions shown; findings below may reference images not displayed]

FINDINGS: Diffuse osteopenia may limit detection of small, nondisplaced
fractures. No visible fracture or traumatic malalignment is evident.
The femoral heads remain normally located. No proximal femoral
fractures are identified. There are moderate hypertrophic
osteoarthrosis features in both hips as well as at the SI joints and
symphysis pubis. The sacrum is partially obscured by overlying bowel
gas. Discogenic and facet degenerative changes are present in the
lower lumbar spine most pronounced at the lumbosacral junction.
Gaseous distention of what appears to be the stomach. Remainder of
the bowel gas pattern is unremarkable. Soft tissues are otherwise
free of acute abnormality.
IMPRESSION: 1. Diffuse osteopenia may limit detection of small, nondisplaced
fractures. No visible fracture or traumatic malalignment.
2. Moderate degenerative changes of the hips and SI joints.
